# Patient Record
Sex: Male | Born: 1960 | Race: White | Hispanic: No | Marital: Single | Smoking: Former smoker
Health system: Southern US, Community
[De-identification: ages and names within clinical notes are randomized; demographics above are authoritative.]

## PROBLEM LIST (undated history)

## (undated) DIAGNOSIS — R569 Unspecified convulsions: Secondary | ICD-10-CM

---

## 2015-04-01 ENCOUNTER — Other Ambulatory Visit: Payer: Self-pay | Admitting: Otolaryngology

## 2015-04-01 DIAGNOSIS — R0982 Postnasal drip: Secondary | ICD-10-CM

## 2015-04-01 DIAGNOSIS — R51 Headache: Secondary | ICD-10-CM

## 2015-04-01 DIAGNOSIS — R519 Headache, unspecified: Secondary | ICD-10-CM

## 2015-04-07 ENCOUNTER — Ambulatory Visit
Admission: RE | Admit: 2015-04-07 | Discharge: 2015-04-07 | Disposition: A | Discharge status: Home / Self Care | Payer: No Typology Code available for payment source | Source: Ambulatory Visit | Attending: Otolaryngology | Admitting: Otolaryngology

## 2015-04-07 DIAGNOSIS — R51 Headache: Secondary | ICD-10-CM | POA: Insufficient documentation

## 2015-04-07 DIAGNOSIS — R0982 Postnasal drip: Secondary | ICD-10-CM

## 2015-04-07 DIAGNOSIS — R519 Headache, unspecified: Secondary | ICD-10-CM

## 2015-04-07 DIAGNOSIS — R0981 Nasal congestion: Secondary | ICD-10-CM | POA: Diagnosis present

## 2016-01-06 ENCOUNTER — Emergency Department
Admission: EM | Admit: 2016-01-06 | Discharge: 2016-01-06 | Disposition: A | Discharge status: Home / Self Care | Payer: No Typology Code available for payment source | Attending: Emergency Medicine | Admitting: Emergency Medicine

## 2016-01-06 ENCOUNTER — Emergency Department: Payer: No Typology Code available for payment source

## 2016-01-06 DIAGNOSIS — F19230 Other psychoactive substance dependence with withdrawal, uncomplicated: Secondary | ICD-10-CM

## 2016-01-06 DIAGNOSIS — F19930 Other psychoactive substance use, unspecified with withdrawal, uncomplicated: Secondary | ICD-10-CM

## 2016-01-06 DIAGNOSIS — Z87891 Personal history of nicotine dependence: Secondary | ICD-10-CM | POA: Insufficient documentation

## 2016-01-06 DIAGNOSIS — F1010 Alcohol abuse, uncomplicated: Secondary | ICD-10-CM | POA: Insufficient documentation

## 2016-01-06 DIAGNOSIS — R569 Unspecified convulsions: Secondary | ICD-10-CM | POA: Insufficient documentation

## 2016-01-06 LAB — COMPREHENSIVE METABOLIC PANEL
ALT: 58 U/L (ref 17–63)
AST: 81 U/L — AB (ref 15–41)
Albumin: 5 g/dL (ref 3.5–5.0)
Alkaline Phosphatase: 57 U/L (ref 38–126)
Anion gap: 23 — ABNORMAL HIGH (ref 5–15)
BILIRUBIN TOTAL: 2.3 mg/dL — AB (ref 0.3–1.2)
BUN: 7 mg/dL (ref 6–20)
CO2: 20 mmol/L — ABNORMAL LOW (ref 22–32)
CREATININE: 0.55 mg/dL — AB (ref 0.61–1.24)
Calcium: 9.6 mg/dL (ref 8.9–10.3)
Chloride: 92 mmol/L — ABNORMAL LOW (ref 101–111)
GFR calc Af Amer: >60 mL/min (ref >60)
Glucose, Bld: 210 mg/dL — ABNORMAL HIGH (ref 65–99)
POTASSIUM: 3.3 mmol/L — AB (ref 3.5–5.1)
Sodium: 135 mmol/L (ref 135–145)
TOTAL PROTEIN: 8.5 g/dL — AB (ref 6.5–8.1)

## 2016-01-06 LAB — URINALYSIS COMPLETE WITH MICROSCOPIC (ARMC ONLY)
BILIRUBIN URINE: NEGATIVE
Leukocytes, UA: NEGATIVE
Nitrite: NEGATIVE
Protein, ur: >500 mg/dL — AB
SQUAMOUS EPITHELIAL / LPF: NONE SEEN
Specific Gravity, Urine: 1.016 (ref 1.005–1.030)
pH: 6 (ref 5.0–8.0)

## 2016-01-06 LAB — TROPONIN I

## 2016-01-06 LAB — CBC WITH DIFFERENTIAL/PLATELET
BASOS ABS: 0.1 10*3/uL (ref 0–0.1)
Basophils Relative: 1 %
Eosinophils Absolute: 0 10*3/uL (ref 0–0.7)
Eosinophils Relative: 0 %
HEMATOCRIT: 38 % — AB (ref 40.0–52.0)
Hemoglobin: 13.1 g/dL (ref 13.0–18.0)
LYMPHS ABS: 0.4 10*3/uL — AB (ref 1.0–3.6)
LYMPHS PCT: 9 %
MCH: 35.1 pg — ABNORMAL HIGH (ref 26.0–34.0)
MCHC: 34.3 g/dL (ref 32.0–36.0)
MCV: 102.2 fL — AB (ref 80.0–100.0)
MONOS PCT: 10 %
Monocytes Absolute: 0.5 10*3/uL (ref 0.2–1.0)
NEUTROS ABS: 3.8 10*3/uL (ref 1.4–6.5)
Neutrophils Relative %: 80 %
Platelets: 118 10*3/uL — ABNORMAL LOW (ref 150–440)
RBC: 3.72 MIL/uL — ABNORMAL LOW (ref 4.40–5.90)
RDW: 15.7 % — AB (ref 11.5–14.5)
WBC: 4.8 10*3/uL (ref 3.8–10.6)

## 2016-01-06 LAB — URINE DRUG SCREEN, QUALITATIVE (ARMC ONLY)
Amphetamines, Ur Screen: NOT DETECTED
Barbiturates, Ur Screen: NOT DETECTED
Benzodiazepine, Ur Scrn: NOT DETECTED
COCAINE METABOLITE, UR ~~LOC~~: NOT DETECTED
Cannabinoid 50 Ng, Ur ~~LOC~~: NOT DETECTED
MDMA (ECSTASY) UR SCREEN: NOT DETECTED
METHADONE SCREEN, URINE: NOT DETECTED
Opiate, Ur Screen: NOT DETECTED
Phencyclidine (PCP) Ur S: NOT DETECTED
TRICYCLIC, UR SCREEN: NOT DETECTED

## 2016-01-06 LAB — ETHANOL: Alcohol, Ethyl (B): <5 mg/dL (ref <5)

## 2016-01-06 MED ORDER — CHLORDIAZEPOXIDE HCL 25 MG PO CAPS
50.0000 mg | ORAL_CAPSULE | Freq: Once | ORAL | Status: AC
  Administered 2016-01-06: 50 mg via ORAL
  Filled 2016-01-06: qty 2

## 2016-01-06 MED ORDER — LORAZEPAM 2 MG/ML IJ SOLN
1.0000 mg | Freq: Once | INTRAMUSCULAR | Status: AC
  Administered 2016-01-06: 1 mg via INTRAVENOUS
  Filled 2016-01-06: qty 1

## 2016-01-06 MED ORDER — CLONIDINE HCL 0.1 MG/24HR TD PTWK: 0.1000 mg | MEDICATED_PATCH | TRANSDERMAL | Status: DC

## 2016-01-06 MED ORDER — CHLORDIAZEPOXIDE HCL 25 MG PO CAPS: ORAL_CAPSULE | ORAL | Status: DC

## 2016-01-06 NOTE — ED Provider Notes (Signed)
[facility] Emergency Department Provider Note     Time seen: ----------------------------------------- 7:16 PM on 01/06/2016 -----------------------------------------    I have reviewed the triage vital signs and the nursing notes.   HISTORY  Chief Complaint Seizures    HPI Sergio Curry is a 55 y.o. male who presents to the ER after a seizure like event. Spouse stated he became unresponsive stiffened up and started shaking during supper. EMS arrived stated he was post ictal but he does not have a history of seizures. Patient reports he drinks alcohol but has not drank since last night. He was incontinent, did not bite his tongue.   History reviewed. No pertinent past medical history.  There are no active problems to display for this patient.   History reviewed. No pertinent past surgical history.  Allergies Review of patient's allergies indicates not on file.  Social History Social History  Substance Use Topics  . Smoking status: None  . Smokeless tobacco: None  . Alcohol Use: None    Review of Systems Constitutional: Negative for fever. Eyes: Negative for visual changes. ENT: Negative for sore throat. Cardiovascular: Negative for chest pain. Respiratory: Negative for shortness of breath. Gastrointestinal: Negative for abdominal pain, vomiting and diarrhea. Genitourinary: Negative for dysuria. Musculoskeletal: Negative for back pain. Skin: Negative for rash. Neurological: Negative for headaches, focal weakness or numbness.  10-point ROS otherwise negative.  ____________________________________________   PHYSICAL EXAM:  VITAL SIGNS: ED Triage Vitals  Enc Vitals Group     BP --      Pulse --      Resp --      Temp --      Temp src --      SpO2 01/06/16 1910 96 %     Weight --      Height --      Head Cir --      Peak Flow --      Pain Score 01/06/16 1914 0     Pain Loc --      Pain Edu? --      Excl. in GC? --      Constitutional: Alert but mildly disoriented to the date. Eyes: Conjunctivae are normal. PERRL. Normal extraocular movements. ENT   Head: Normocephalic and atraumatic.   Nose: No congestion/rhinnorhea.   Mouth/Throat: Mucous membranes are moist.   Neck: No stridor. Cardiovascular: Rapid rate, regular rhythm. No murmurs, rubs, or gallops. Respiratory: Normal respiratory effort without tachypnea nor retractions. Breath sounds are clear and equal bilaterally. No wheezes/rales/rhonchi. Gastrointestinal: Soft and nontender. Normal bowel sounds Musculoskeletal: Nontender with normal range of motion in all extremities. No lower extremity tenderness nor edema. Neurologic:  Normal speech and language. No gross focal neurologic deficits are appreciated. Tremulous, no sensory or motor deficits Skin:  Skin is warm, dry and intact. No rash noted. Psychiatric: Mood and affect are normal. Speech and behavior are normal.  ____________________________________________  EKG: Interpreted by me. Sinus tachycardia with a rate of 113 bpm, normal PR interval, normal QRS, normal QT interval. LVH, normal axis  ____________________________________________  ED COURSE:  Pertinent labs & imaging results that were available during my care of the patient were reviewed by me and considered in my medical decision making (see chart for details). Patient likely with alcohol withdrawal seizure. He will receive Ativan and be placed on CIWA protocol. We'll also assess for medical etiology for his seizure. ____________________________________________    LABS (pertinent positives/negatives)  Labs Reviewed  CBC WITH DIFFERENTIAL/PLATELET - Abnormal;  Notable for the following:    RBC 3.72 (*)    HCT 38.0 (*)    MCV 102.2 (*)    MCH 35.1 (*)    RDW 15.7 (*)    Platelets 118 (*)    Lymphs Abs 0.4 (*)    All other components within normal limits  COMPREHENSIVE METABOLIC PANEL - Abnormal; Notable for the  following:    Potassium 3.3 (*)    Chloride 92 (*)    CO2 20 (*)    Glucose, Bld 210 (*)    Creatinine, Ser 0.55 (*)    Total Protein 8.5 (*)    AST 81 (*)    Total Bilirubin 2.3 (*)    Anion gap 23 (*)    All other components within normal limits  TROPONIN I  ETHANOL  URINALYSIS COMPLETEWITH MICROSCOPIC ([facility] ONLY)  URINE DRUG SCREEN, QUALITATIVE ([facility] ONLY)    RADIOLOGY Images were viewed by me  CT head IMPRESSION: Mild generalized atrophy. No acute intracranial abnormality. ____________________________________________  FINAL ASSESSMENT AND PLAN  Seizure, chronic alcoholism  Plan: Patient with labs and imaging as dictated above. Patient presents after what is likely a withdrawal seizure. Currently his blood pressures improving but he remains tachycardic. I have offered him admission for detox and he has declined at this time. Patient states his mother will be buried tomorrow and he cannot stay for detox and to prevent withdrawal seizures. I advised him of the risks of attempting this at home. He was given an oral dose of Librium here and he will be discharged with a Librium taper and clonidine. I have given him information for RTS as well.   Emily FilbertWilliams, Dejae Bernet E, MD   Emily FilbertJonathan E Kasai Beltran, MD 01/06/16 [phone]

## 2016-01-06 NOTE — Discharge Instructions (Signed)
Alcohol Use Disorder  Alcohol use disorder is a mental disorder. It is not a one-time incident of heavy drinking. Alcohol use disorder is the excessive and uncontrollable use of alcohol over time that leads to problems with functioning in one or more areas of daily living. People with this disorder risk harming themselves and others when they drink to excess. Alcohol use disorder also can cause other mental disorders, such as mood and anxiety disorders, and serious physical problems. People with alcohol use disorder often misuse other drugs.   Alcohol use disorder is common and widespread. Some people with this disorder drink alcohol to cope with or escape from negative life events. Others drink to relieve chronic pain or symptoms of mental illness. People with a family history of alcohol use disorder are at higher risk of losing control and using alcohol to excess.   Drinking too much alcohol can cause injury, accidents, and health problems. One drink can be too much when you are:  · Working.  · Pregnant or breastfeeding.  · Taking medicines. Ask your doctor.  · Driving or planning to drive.  SYMPTOMS   Signs and symptoms of alcohol use disorder may include the following:   · Consumption of alcohol in larger amounts or over a longer period of time than intended.  · Multiple unsuccessful attempts to cut down or control alcohol use.    · A great deal of time spent obtaining alcohol, using alcohol, or recovering from the effects of alcohol (hangover).  · A strong desire or urge to use alcohol (cravings).    · Continued use of alcohol despite problems at work, school, or home because of alcohol use.    · Continued use of alcohol despite problems in relationships because of alcohol use.  · Continued use of alcohol in situations when it is physically hazardous, such as driving a car.  · Continued use of alcohol despite awareness of a physical or psychological problem that is likely related to alcohol use. Physical  problems related to alcohol use can involve the brain, heart, liver, stomach, and intestines. Psychological problems related to alcohol use include intoxication, depression, anxiety, psychosis, delirium, and dementia.    · The need for increased amounts of alcohol to achieve the same desired effect, or a decreased effect from the consumption of the same amount of alcohol (tolerance).  · Withdrawal symptoms upon reducing or stopping alcohol use, or alcohol use to reduce or avoid withdrawal symptoms. Withdrawal symptoms include:  ¨ Racing heart.  ¨ Hand tremor.  ¨ Difficulty sleeping.  ¨ Nausea.  ¨ Vomiting.  ¨ Hallucinations.  ¨ Restlessness.  ¨ Seizures.  DIAGNOSIS  Alcohol use disorder is diagnosed through an assessment by your health care provider. Your health care provider may start by asking three or four questions to screen for excessive or problematic alcohol use. To confirm a diagnosis of alcohol use disorder, at least two symptoms must be present within a 12-month period. The severity of alcohol use disorder depends on the number of symptoms:  · Mild--two or three.  · Moderate--four or five.  · Severe--six or more.  Your health care provider may perform a physical exam or use results from lab tests to see if you have physical problems resulting from alcohol use. Your health care provider may refer you to a mental health professional for evaluation.  TREATMENT   Some people with alcohol use disorder are able to reduce their alcohol use to low-risk levels. Some people with alcohol use disorder need to quit drinking alcohol. When   necessary, mental health professionals with specialized training in substance use treatment can help. Your health care provider can help you decide how severe your alcohol use disorder is and what type of treatment you need. The following forms of treatment are available:   · Detoxification. Detoxification involves the use of prescription medicines to prevent alcohol withdrawal  symptoms in the first week after quitting. This is important for people with a history of symptoms of withdrawal and for heavy drinkers who are likely to have withdrawal symptoms. Alcohol withdrawal can be dangerous and, in severe cases, cause death. Detoxification is usually provided in a hospital or in-patient substance use treatment facility.  · Counseling or talk therapy. Talk therapy is provided by substance use treatment counselors. It addresses the reasons people use alcohol and ways to keep them from drinking again. The goals of talk therapy are to help people with alcohol use disorder find healthy activities and ways to cope with life stress, to identify and avoid triggers for alcohol use, and to handle cravings, which can cause relapse.  · Medicines. Different medicines can help treat alcohol use disorder through the following actions:    Decrease alcohol cravings.    Decrease the positive reward response felt from alcohol use.    Produce an uncomfortable physical reaction when alcohol is used (aversion therapy).  · Support groups. Support groups are run by people who have quit drinking. They provide emotional support, advice, and guidance.  These forms of treatment are often combined. Some people with alcohol use disorder benefit from intensive combination treatment provided by specialized substance use treatment centers. Both inpatient and outpatient treatment programs are available.     This information is not intended to replace advice given to you by your health care provider. Make sure you discuss any questions you have with your health care provider.     Document Released: 10/07/2004 Document Revised: 09/20/2014 Document Reviewed: 12/07/2012  Elsevier Interactive Patient Education ©2016 [employer]    Alcohol Withdrawal  Alcohol withdrawal is a group of symptoms that can develop when a person who drinks heavily and regularly stops drinking or drinks less.  CAUSES  Heavy and regular drinking can  cause chemicals that send signals from the brain to the body (neurotransmitters) to deactivate. Alcohol withdrawal develops when deactivated neurotransmitters reactivate because a person stops drinking or drinks less.  RISK FACTORS  The more a person drinks and the longer he or she drinks, the greater the risk of alcohol withdrawal. Severe withdrawal is more likely to develop in someone who:  · Had severe alcohol withdrawal in the past.  · Had a seizure during a previous episode of alcohol withdrawal.  · Is elderly.  · Is pregnant.  · Has been abusing drugs.  · Has other medical problems, including:    Infection.    Heart, lung, or liver disease.    Seizures.    Mental health problems.  SYMPTOMS  Symptoms of this condition can be mild to moderate, or they can be severe.  Mild to moderate symptoms may include:  · Fatigue.  · Nightmares.  · Trouble sleeping.  · Depression.  · Anxiety.  · Inability to think clearly.  · Mood swings.  · Irritability.  · Loss of appetite.  · Nausea or vomiting.  · Clammy skin.  · Extreme sweating.  · Rapid heartbeat.  · Shakiness.  · Uncontrollable shaking (tremor).  Severe symptoms may include:  · Fever.  · Seizures.  · Severe confusion.  · Feeling   or seeing things that are not there (hallucinations).  Symptoms usually begin within eight hours after a person stops drinking or drinks less. They can last for weeks.  DIAGNOSIS  Alcohol withdrawal is diagnosed with a medical history and physical exam. Sometimes, urine and blood tests are also done.  TREATMENT  Treatment may involve:  · Monitoring blood pressure, pulse, and breathing.  · Getting fluids through an IV tube.  · Medicine to reduce anxiety.  · Medicine to prevent or control seizures.  · Multivitamins and B vitamins.  · Having a health care provider check on you daily.  If symptoms are moderate to severe or if there is a risk of severe withdrawal, treatment may be done at a hospital or treatment center.  HOME CARE  INSTRUCTIONS  · Take medicines and vitamin supplements only as directed by your health care provider.  · Do not drink alcohol.  · Have someone stay with you or be available if you need help.  · Drink enough fluid to keep your urine clear or pale yellow.  · Consider joining a 12-step program or another alcohol support group.  SEEK MEDICAL CARE IF:  · Your symptoms get worse or do not go away.  · You cannot keep food or water in your stomach.  · You are struggling with not drinking alcohol.  · You cannot stop drinking alcohol.  SEEK IMMEDIATE MEDICAL CARE IF:   · You have an irregular heartbeat.  · You have chest pain.  · You have trouble breathing.  · You have symptoms of severe withdrawal, such as:    A fever.    Seizures.    Severe confusion.    Hallucinations.     This information is not intended to replace advice given to you by your health care provider. Make sure you discuss any questions you have with your health care provider.     Document Released: 06/09/2005 Document Revised: 09/20/2014 Document Reviewed: 06/18/2014  Elsevier Interactive Patient Education ©2016 Else[employer] Inc.

## 2016-01-06 NOTE — ED Notes (Signed)
Per ems the spouse stated that pt "fell out" and stiffened up and started having a seizure during supper - ems arrived and stated he was foaming at the mouth and was post-dictal - ems reports htn and sinus tach - pt presents with memory loss and does not remember the seizure activity - oriented to person only at this time - pt has voided on self and was not aware - MD at bedside assessing pt

## 2016-01-06 NOTE — ED Notes (Signed)
Per ems the spouse stated that pt "fell out" and stiffened up and started having a seizure during supper - ems arrived and stated he was foaming at the mouth and was post-dictal - ems reports htn and sinus tach

## 2016-01-06 NOTE — ED Notes (Signed)
Pt aware of need for urine sample.  

## 2016-01-06 NOTE — ED Notes (Signed)
Pt reminded of need for urine sample.  

## 2016-05-27 IMAGING — CT CT MAXILLOFACIAL W/O CM
3 series · 16 of 47 positions shown, 19 images · non-contrast
Comparison: None.

CLINICAL DATA: Left facial congestion, pain and pressure. Left
exophthalmos. Blurred vision. Facial trauma 7220 with rhinoplasty

EXAM:
CT MAXILLOFACIAL WITHOUT CONTRAST
TECHNIQUE: Multidetector CT imaging of the maxillofacial structures was
performed. Multiplanar CT image reconstructions were also generated.
A small metallic BB was placed on the right temple in order to
reliably differentiate right from left.

[Series 2: max soft · axial · 0.29mm/px · z∈[-198,-70]mm · 10 of 76 slices shown, 13 images]
[im 6/76  brain]
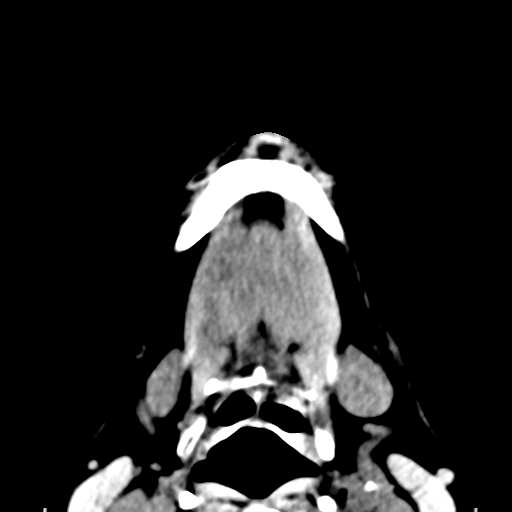
[im 6/76  bone]
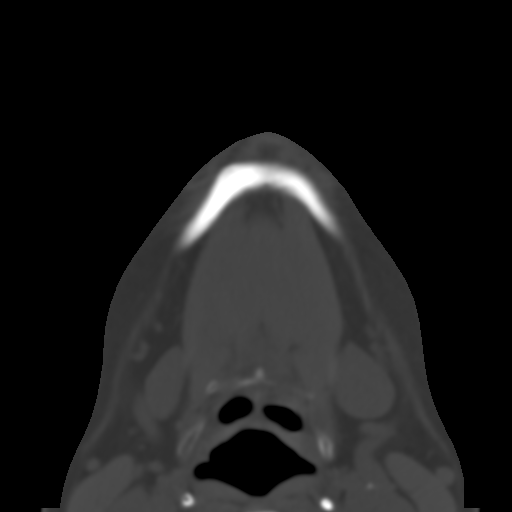
[im 13/76  bone]
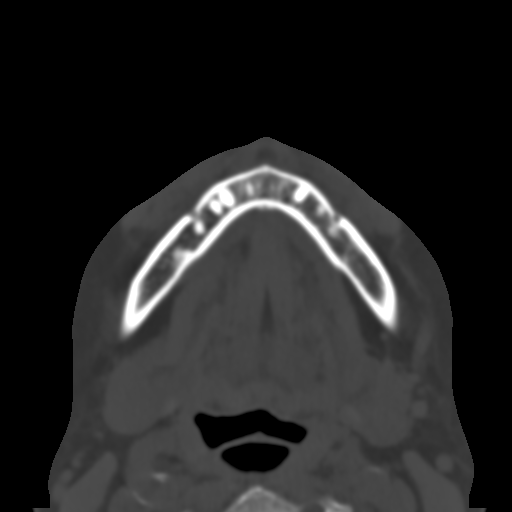
[im 21/76  bone]
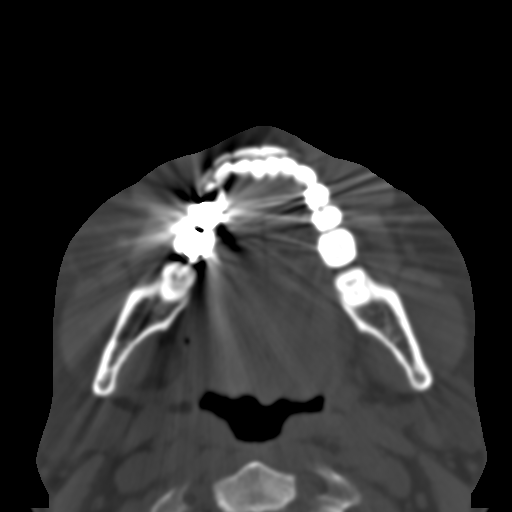
[im 26/76  bone]
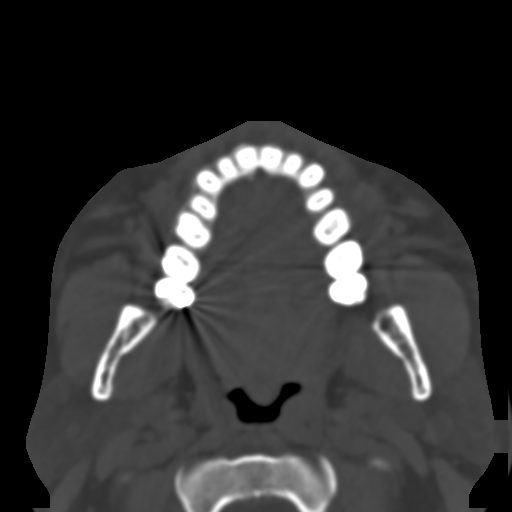
[im 34/76  brain]
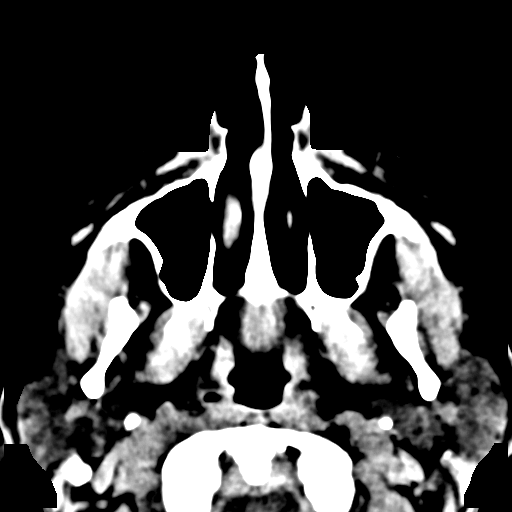
[im 34/76  bone]
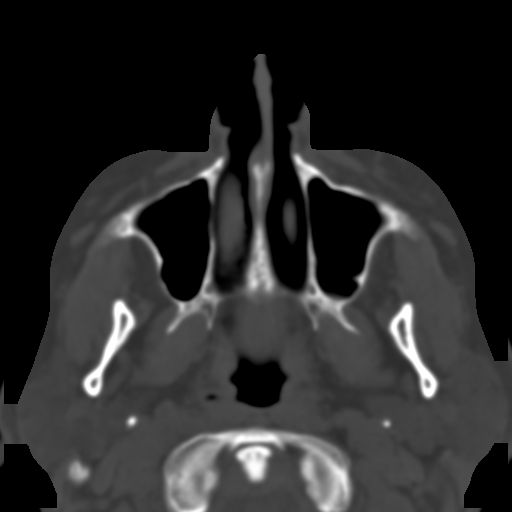
[im 42/76  bone]
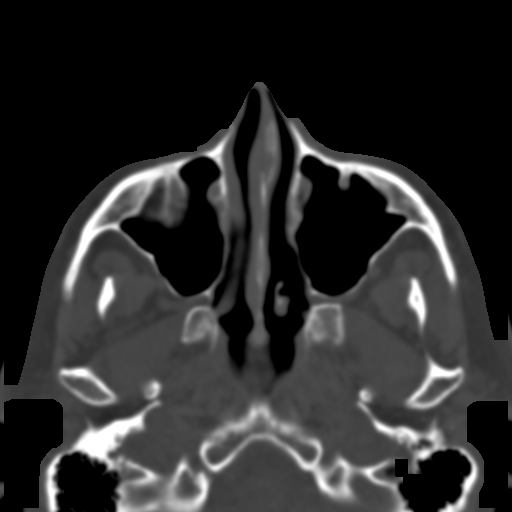
[im 50/76  bone]
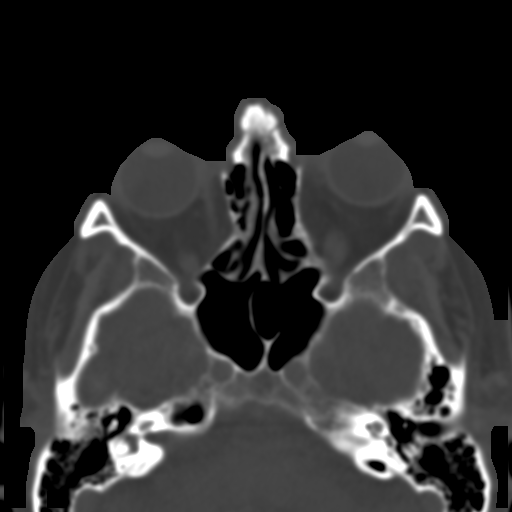
[im 57/76  bone]
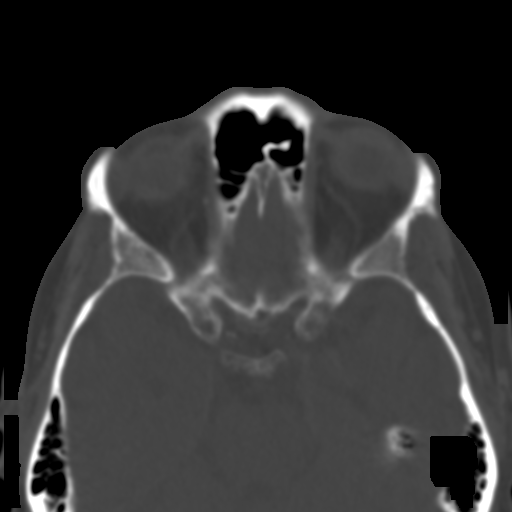
[im 63/76  brain]
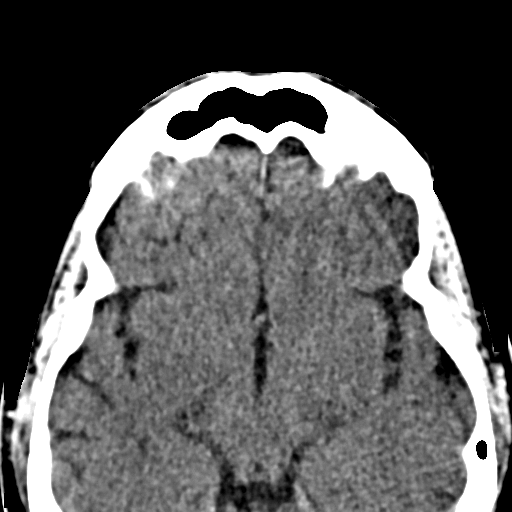
[im 63/76  bone]
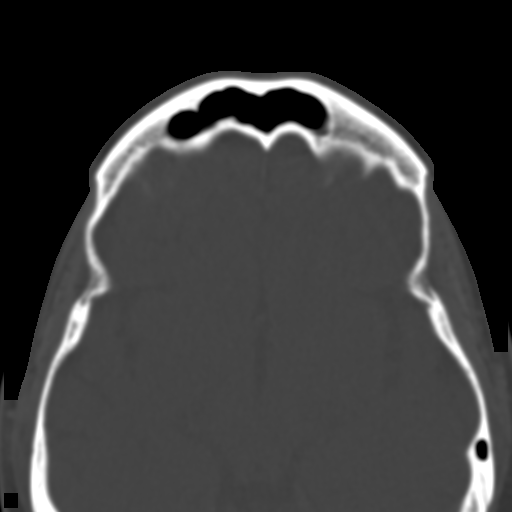
[im 70/76  bone]
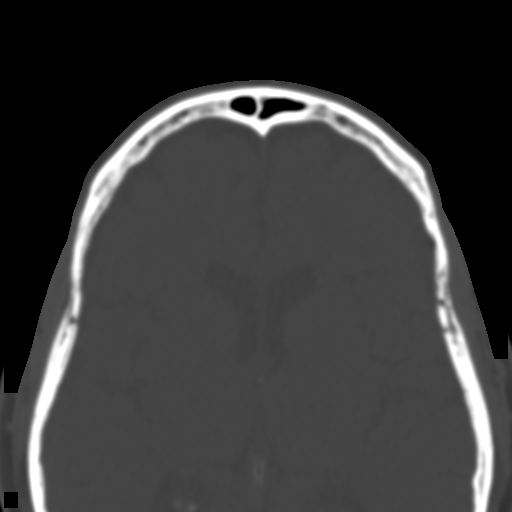

[Series 4: coronal soft · coronal · 0.31mm/px · 3 of 71 slices shown]
[im 24/71  bone]
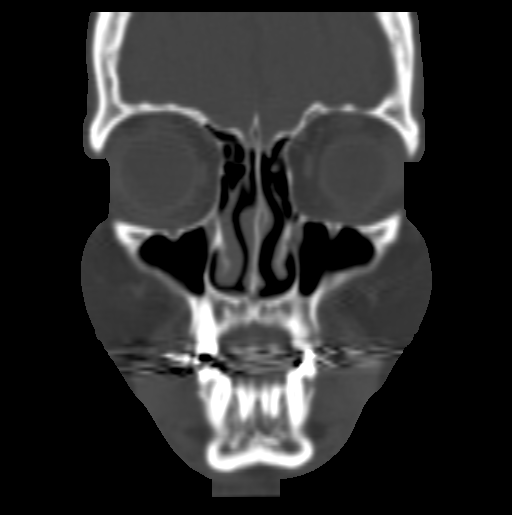
[im 32/71  bone]
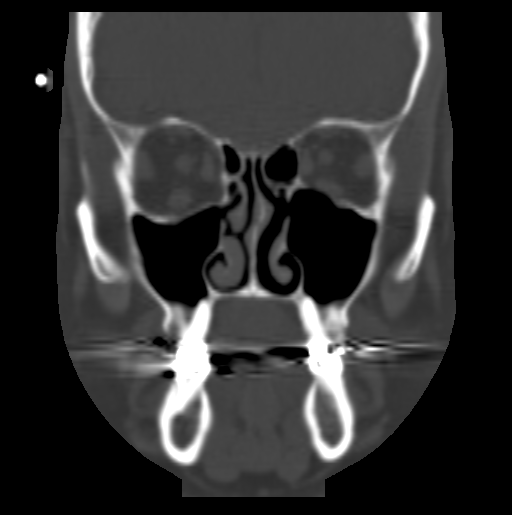
[im 39/71  bone]
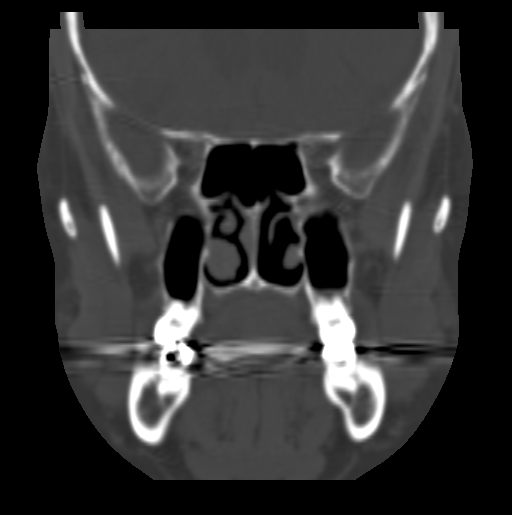

[Series 5: sagittal soft · sagittal · 0.29mm/px · 3 of 81 slices shown]
[im 27/81  bone]
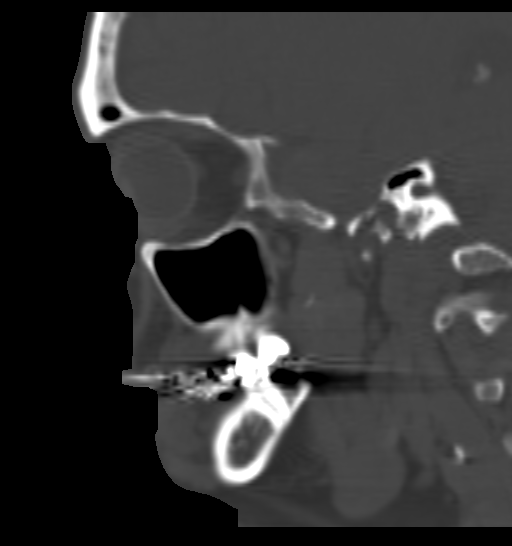
[im 41/81  bone]
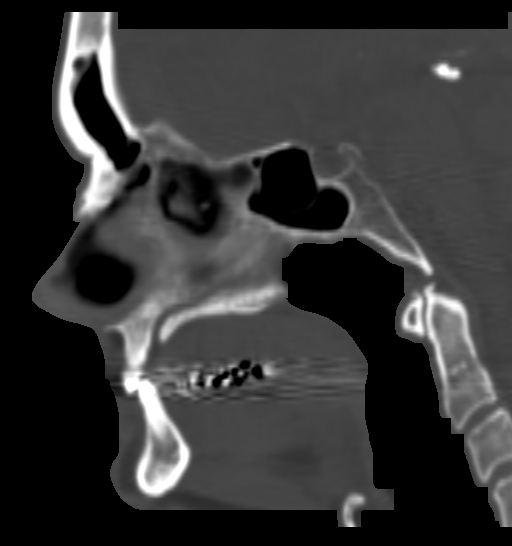
[im 54/81  bone]
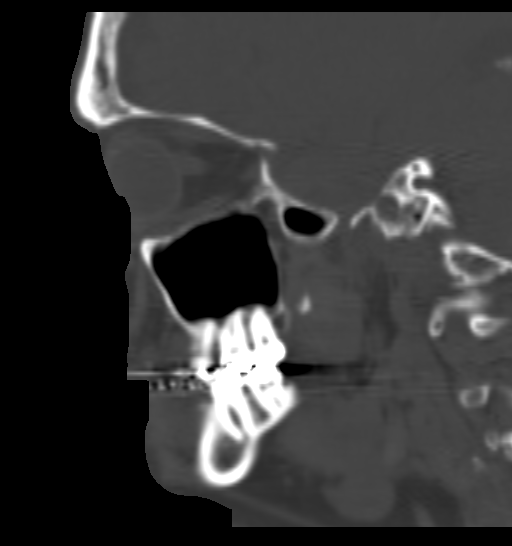

[16 of 47 positions shown; findings below may reference images not displayed]

FINDINGS: Frontal, ethmoid, sphenoid and maxillary sinuses are entirely clear
without mucosal thickening, polyp, cyst, mass or layering fluid.

There are old medial wall orbital fractures bilaterally which have
healed with some inward bowing, more so on the right than the left.
This could explain the relative ex on the most on the left. There
are old nasal fractures, treated surgically by history. The nasal
septum bows to the left 3 mm superiorly. Ostiomeatal complexes are
widely patent bilaterally, with previous surgical change on the left
of partial ethmoidectomy and middle turbinate resection. Nasal
passages appear patent.

No evidence of injury to either globe. Extra-ocular muscles appear
normal. Intraorbital soft tissues otherwise appear normal.

No intracranial abnormality other than atherosclerosis of the major
vessels at the base of the brain.
IMPRESSION: No evidence of inflammatory sinus disease.

Old lamina papyracea fractures bilaterally which have healed with
some inward bowing, more on the right than the left. This could
explain the left exophthalmos relative to the right.

Old nasal fractures treated surgically.

Wide patency of the ostiomeatal complexes bilaterally. Previous
partial ethmoidectomy and middle turbinate resection on the left.

## 2019-06-19 ENCOUNTER — Emergency Department
Admission: EM | Admit: 2019-06-19 | Discharge: 2019-06-19 | Disposition: A | Discharge status: Home / Self Care | Payer: No Typology Code available for payment source | Attending: Emergency Medicine | Admitting: Emergency Medicine

## 2019-06-19 ENCOUNTER — Other Ambulatory Visit: Payer: Self-pay

## 2019-06-19 DIAGNOSIS — Z79899 Other long term (current) drug therapy: Secondary | ICD-10-CM | POA: Diagnosis not present

## 2019-06-19 DIAGNOSIS — G40909 Epilepsy, unspecified, not intractable, without status epilepticus: Secondary | ICD-10-CM | POA: Insufficient documentation

## 2019-06-19 DIAGNOSIS — R569 Unspecified convulsions: Secondary | ICD-10-CM | POA: Diagnosis present

## 2019-06-19 DIAGNOSIS — Z87891 Personal history of nicotine dependence: Secondary | ICD-10-CM | POA: Insufficient documentation

## 2019-06-19 HISTORY — DX: Unspecified convulsions: R56.9

## 2019-06-19 LAB — COMPREHENSIVE METABOLIC PANEL
ALT: 29 U/L (ref 0–44)
AST: 71 U/L — ABNORMAL HIGH (ref 15–41)
Albumin: 4.7 g/dL (ref 3.5–5.0)
Alkaline Phosphatase: 67 U/L (ref 38–126)
Anion gap: 22 — ABNORMAL HIGH (ref 5–15)
BUN: 6 mg/dL (ref 6–20)
CO2: 21 mmol/L — ABNORMAL LOW (ref 22–32)
Calcium: 9.4 mg/dL (ref 8.9–10.3)
Chloride: 90 mmol/L — ABNORMAL LOW (ref 98–111)
Creatinine, Ser: 0.45 mg/dL — ABNORMAL LOW (ref 0.61–1.24)
GFR calc Af Amer: >60 mL/min (ref >60)
GFR calc non Af Amer: >60 mL/min (ref >60)
Glucose, Bld: 188 mg/dL — ABNORMAL HIGH (ref 70–99)
Potassium: 3.4 mmol/L — ABNORMAL LOW (ref 3.5–5.1)
Sodium: 133 mmol/L — ABNORMAL LOW (ref 135–145)
Total Bilirubin: 2.3 mg/dL — ABNORMAL HIGH (ref 0.3–1.2)
Total Protein: 8.1 g/dL (ref 6.5–8.1)

## 2019-06-19 LAB — BASIC METABOLIC PANEL
Anion gap: 15 (ref 5–15)
BUN: 6 mg/dL (ref 6–20)
CO2: 26 mmol/L (ref 22–32)
Calcium: 8.4 mg/dL — ABNORMAL LOW (ref 8.9–10.3)
Chloride: 93 mmol/L — ABNORMAL LOW (ref 98–111)
Creatinine, Ser: 0.39 mg/dL — ABNORMAL LOW (ref 0.61–1.24)
GFR calc Af Amer: >60 mL/min (ref >60)
GFR calc non Af Amer: >60 mL/min (ref >60)
Glucose, Bld: 110 mg/dL — ABNORMAL HIGH (ref 70–99)
Potassium: 3.1 mmol/L — ABNORMAL LOW (ref 3.5–5.1)
Sodium: 134 mmol/L — ABNORMAL LOW (ref 135–145)

## 2019-06-19 LAB — CBC
HCT: 34.3 % — ABNORMAL LOW (ref 39.0–52.0)
Hemoglobin: 12 g/dL — ABNORMAL LOW (ref 13.0–17.0)
MCH: 35.3 pg — ABNORMAL HIGH (ref 26.0–34.0)
MCHC: 35 g/dL (ref 30.0–36.0)
MCV: 100.9 fL — ABNORMAL HIGH (ref 80.0–100.0)
Platelets: 90 10*3/uL — ABNORMAL LOW (ref 150–400)
RBC: 3.4 MIL/uL — ABNORMAL LOW (ref 4.22–5.81)
RDW: 14.6 % (ref 11.5–15.5)
WBC: 4.6 10*3/uL (ref 4.0–10.5)
nRBC: 0 % (ref 0.0–0.2)

## 2019-06-19 LAB — TROPONIN I (HIGH SENSITIVITY): Troponin I (High Sensitivity): 6 ng/L (ref <18)

## 2019-06-19 MED ORDER — CHLORDIAZEPOXIDE HCL 25 MG PO CAPS
25.0000 mg | ORAL_CAPSULE | Freq: Once | ORAL | Status: AC
  Administered 2019-06-19: 25 mg via ORAL
  Filled 2019-06-19: qty 1

## 2019-06-19 MED ORDER — LORAZEPAM 1 MG PO TABS
1.0000 mg | ORAL_TABLET | Freq: Once | ORAL | Status: AC
  Administered 2019-06-19: 1 mg via ORAL
  Filled 2019-06-19: qty 1

## 2019-06-19 MED ORDER — SODIUM CHLORIDE 0.9 % IV BOLUS
1000.0000 mL | Freq: Once | INTRAVENOUS | Status: AC
  Administered 2019-06-19: 1000 mL via INTRAVENOUS

## 2019-06-19 NOTE — ED Notes (Signed)
Pt given urinal.
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
## 2020-05-21 ENCOUNTER — Other Ambulatory Visit: Payer: Self-pay

## 2020-05-21 ENCOUNTER — Ambulatory Visit
Admission: EM | Admit: 2020-05-21 | Discharge: 2020-05-21 | Disposition: A | Discharge status: Home / Self Care | Payer: No Typology Code available for payment source | Attending: Family Medicine | Admitting: Family Medicine

## 2020-05-21 DIAGNOSIS — H938X1 Other specified disorders of right ear: Secondary | ICD-10-CM | POA: Diagnosis not present

## 2020-05-21 DIAGNOSIS — H6091 Unspecified otitis externa, right ear: Secondary | ICD-10-CM

## 2020-05-21 MED ORDER — CIPROFLOXACIN-DEXAMETHASONE 0.3-0.1 % OT SUSP: 4.0000 [drp] | Freq: Two times a day (BID) | OTIC | 0 refills | Status: DC

## 2020-05-21 NOTE — ED Triage Notes (Signed)
Pt is here with right ear pain that started Saturday, pt has taken Advil to relieve discomfort.

## 2020-05-21 NOTE — Discharge Instructions (Signed)
Use 4 drops twice a day for 7 days  Follow up with this office or with primary care if symptoms are persisting  Follow up with the ER for trouble swallowing, trouble breathing, other concerning symptoms

## 2020-05-21 NOTE — ED Provider Notes (Signed)
[facility]   [id number] 05/21/20 Arrival Time: 1558  CC: EAR PAIN  SUBJECTIVE: History from: patient.  Sergio Curry is a 59 y.o. male who presents with of right ear fullness that started about 4 days ago.  Reports that he does use ear buds.  Reports that the right ear feels wet and he has decreased hearing since he began having symptoms 4 days ago.  Has attempted OTC treatment with peroxide and water. There has been no relief. Denies fever, chills, fatigue, sinus pain, rhinorrhea, sore throat, SOB, wheezing, chest pain, nausea, changes in bowel or bladder habits.    ROS: As per HPI.  All other pertinent ROS negative.     Past Medical History:  Diagnosis Date  . Seizures (HCC)    History reviewed. No pertinent surgical history. No Known Allergies No current facility-administered medications on file prior to encounter.   Current Outpatient Medications on File Prior to Encounter  Medication Sig Dispense Refill  . chlordiazePOXIDE (LIBRIUM) 25 MG capsule Take 1 capsule 5 times a day on day 1, then decrease by one capsule daily until gone 15 capsule 0  . cloNIDine (CATAPRES - DOSED IN MG/24 HR) 0.1 mg/24hr patch Place 1 patch (0.1 mg total) onto the skin every 7 (seven) days. 4 patch 11   Social History   Socioeconomic History  . Marital status: Single    Spouse name: Not on file  . Number of children: Not on file  . Years of education: Not on file  . Highest education level: Not on file  Occupational History  . Not on file  Tobacco Use  . Smoking status: Former Smoker    Types: Cigarettes  . Smokeless tobacco: Never Used  Substance and Sexual Activity  . Alcohol use: Yes    Alcohol/week: 7.0 standard drinks    Types: 4 Cans of beer, 3 Shots of liquor per week  . Drug use: Yes    Types: Marijuana  . Sexual activity: Not Currently    Birth control/protection: None  Other Topics Concern  . Not on file  Social History Narrative  . Not on file   Social  Determinants of Health   Financial Resource Strain:   . Difficulty of Paying Living Expenses: Not on file  Food Insecurity:   . Worried About Programme researcher, broadcasting/film/video in the Last Year: Not on file  . Ran Out of Food in the Last Year: Not on file  Transportation Needs:   . Lack of Transportation (Medical): Not on file  . Lack of Transportation (Non-Medical): Not on file  Physical Activity:   . Days of Exercise per Week: Not on file  . Minutes of Exercise per Session: Not on file  Stress:   . Feeling of Stress : Not on file  Social Connections:   . Frequency of Communication with Friends and Family: Not on file  . Frequency of Social Gatherings with Friends and Family: Not on file  . Attends Religious Services: Not on file  . Active Member of Clubs or Organizations: Not on file  . Attends Banker Meetings: Not on file  . Marital Status: Not on file  Intimate Partner Violence:   . Fear of Current or Ex-Partner: Not on file  . Emotionally Abused: Not on file  . Physically Abused: Not on file  . Sexually Abused: Not on file   History reviewed. No pertinent family history.  OBJECTIVE:  Vitals:   05/21/20 1617  BP: (!) 174/90  Pulse: 78  Resp: 17  TempSrc: Oral  SpO2: 98%     General appearance: alert; appears fatigued HEENT: Ears: L EAC clear, R EAC with erythema, swelling, drainage, tenderness, TMs pearly gray with visible cone of light, without erythema ; Eyes: PERRL, EOMI grossly; Sinuses nontender to palpation; Nose: clear rhinorrhea; Throat: oropharynx mildly erythematous, tonsils 1+ without white tonsillar exudates, uvula midline Neck: supple without LAD Lungs: unlabored respirations, symmetrical air entry; cough: absent; no respiratory distress Heart: regular rate and rhythm.  Radial pulses 2+ symmetrical bilaterally Skin: warm and dry Psychological: alert and cooperative; normal mood and affect  Imaging: No results found.   ASSESSMENT & PLAN:  1.  Otitis externa of right ear, unspecified chronicity, unspecified type   2. Ear fullness, right     Meds ordered this encounter  Medications  . ciprofloxacin-dexamethasone (CIPRODEX) OTIC suspension    Sig: Place 4 drops into the right ear 2 (two) times daily.    Dispense:  7.5 mL    Refill:  0    Order Specific Question:   Supervising Provider    Answer:   Merrilee Jansky [[id number]]    Rest and drink plenty of fluids Prescribed ciprodex drops Take medications as directed and to completion Continue to use OTC ibuprofen and/ or tylenol as needed for pain control Follow up with PCP if symptoms persists Return here or go to the ER if you have any new or worsening symptoms   Reviewed expectations re: course of current medical issues. Questions answered. Outlined signs and symptoms indicating need for more acute intervention. Patient verbalized understanding. After Visit Summary given.         Moshe Cipro, NP 05/21/20 [phone]

## 2020-05-22 ENCOUNTER — Telehealth (HOSPITAL_COMMUNITY): Payer: Self-pay | Admitting: Emergency Medicine

## 2020-05-22 MED ORDER — CIPROFLOXACIN-DEXAMETHASONE 0.3-0.1 % OT SUSP: 4.0000 [drp] | Freq: Two times a day (BID) | OTIC | 0 refills | Status: DC

## 2020-05-22 NOTE — Telephone Encounter (Signed)
Patient wanted pharmacy changed

## 2024-07-07 ENCOUNTER — Emergency Department: Payer: Self-pay

## 2024-07-07 ENCOUNTER — Inpatient Hospital Stay
Admission: EM | Admit: 2024-07-07 | Discharge: 2024-07-15 | DRG: 870 | Disposition: A | Discharge status: Hospice | Payer: MEDICAID | Attending: Internal Medicine | Admitting: Internal Medicine

## 2024-07-07 ENCOUNTER — Inpatient Hospital Stay: Payer: Self-pay

## 2024-07-07 DIAGNOSIS — G312 Degeneration of nervous system due to alcohol: Secondary | ICD-10-CM | POA: Diagnosis present

## 2024-07-07 DIAGNOSIS — Z6820 Body mass index (BMI) 20.0-20.9, adult: Secondary | ICD-10-CM

## 2024-07-07 DIAGNOSIS — J9601 Acute respiratory failure with hypoxia: Secondary | ICD-10-CM

## 2024-07-07 DIAGNOSIS — Z1152 Encounter for screening for COVID-19: Secondary | ICD-10-CM

## 2024-07-07 DIAGNOSIS — K7010 Alcoholic hepatitis without ascites: Secondary | ICD-10-CM | POA: Diagnosis present

## 2024-07-07 DIAGNOSIS — Y900 Blood alcohol level of less than 20 mg/100 ml: Secondary | ICD-10-CM | POA: Diagnosis present

## 2024-07-07 DIAGNOSIS — E871 Hypo-osmolality and hyponatremia: Secondary | ICD-10-CM

## 2024-07-07 DIAGNOSIS — I700 Atherosclerosis of aorta: Secondary | ICD-10-CM | POA: Diagnosis present

## 2024-07-07 DIAGNOSIS — I351 Nonrheumatic aortic (valve) insufficiency: Secondary | ICD-10-CM | POA: Diagnosis present

## 2024-07-07 DIAGNOSIS — G40509 Epileptic seizures related to external causes, not intractable, without status epilepticus: Secondary | ICD-10-CM | POA: Diagnosis present

## 2024-07-07 DIAGNOSIS — Z515 Encounter for palliative care: Secondary | ICD-10-CM

## 2024-07-07 DIAGNOSIS — A411 Sepsis due to other specified staphylococcus: Principal | ICD-10-CM | POA: Diagnosis present

## 2024-07-07 DIAGNOSIS — E7220 Disorder of urea cycle metabolism, unspecified: Secondary | ICD-10-CM

## 2024-07-07 DIAGNOSIS — K7030 Alcoholic cirrhosis of liver without ascites: Secondary | ICD-10-CM | POA: Diagnosis present

## 2024-07-07 DIAGNOSIS — D6959 Other secondary thrombocytopenia: Secondary | ICD-10-CM | POA: Diagnosis present

## 2024-07-07 DIAGNOSIS — D649 Anemia, unspecified: Secondary | ICD-10-CM

## 2024-07-07 DIAGNOSIS — K709 Alcoholic liver disease, unspecified: Secondary | ICD-10-CM

## 2024-07-07 DIAGNOSIS — J690 Pneumonitis due to inhalation of food and vomit: Secondary | ICD-10-CM | POA: Diagnosis present

## 2024-07-07 DIAGNOSIS — R7989 Other specified abnormal findings of blood chemistry: Secondary | ICD-10-CM

## 2024-07-07 DIAGNOSIS — F1010 Alcohol abuse, uncomplicated: Secondary | ICD-10-CM

## 2024-07-07 DIAGNOSIS — Z66 Do not resuscitate: Secondary | ICD-10-CM | POA: Diagnosis not present

## 2024-07-07 DIAGNOSIS — E440 Moderate protein-calorie malnutrition: Secondary | ICD-10-CM | POA: Diagnosis present

## 2024-07-07 DIAGNOSIS — G9341 Metabolic encephalopathy: Principal | ICD-10-CM | POA: Diagnosis present

## 2024-07-07 DIAGNOSIS — D696 Thrombocytopenia, unspecified: Secondary | ICD-10-CM

## 2024-07-07 DIAGNOSIS — G928 Other toxic encephalopathy: Secondary | ICD-10-CM | POA: Diagnosis present

## 2024-07-07 DIAGNOSIS — I2489 Other forms of acute ischemic heart disease: Secondary | ICD-10-CM | POA: Diagnosis present

## 2024-07-07 DIAGNOSIS — Z681 Body mass index (BMI) 19 or less, adult: Secondary | ICD-10-CM

## 2024-07-07 DIAGNOSIS — I509 Heart failure, unspecified: Secondary | ICD-10-CM | POA: Diagnosis present

## 2024-07-07 DIAGNOSIS — I330 Acute and subacute infective endocarditis: Secondary | ICD-10-CM | POA: Diagnosis present

## 2024-07-07 DIAGNOSIS — D539 Nutritional anemia, unspecified: Secondary | ICD-10-CM | POA: Diagnosis present

## 2024-07-07 DIAGNOSIS — Z87891 Personal history of nicotine dependence: Secondary | ICD-10-CM

## 2024-07-07 DIAGNOSIS — K769 Liver disease, unspecified: Secondary | ICD-10-CM

## 2024-07-07 DIAGNOSIS — N179 Acute kidney failure, unspecified: Secondary | ICD-10-CM

## 2024-07-07 DIAGNOSIS — E876 Hypokalemia: Secondary | ICD-10-CM

## 2024-07-07 DIAGNOSIS — E8721 Acute metabolic acidosis: Secondary | ICD-10-CM

## 2024-07-07 DIAGNOSIS — F1290 Cannabis use, unspecified, uncomplicated: Secondary | ICD-10-CM | POA: Diagnosis present

## 2024-07-07 DIAGNOSIS — W19XXXA Unspecified fall, initial encounter: Secondary | ICD-10-CM | POA: Diagnosis present

## 2024-07-07 DIAGNOSIS — Z96643 Presence of artificial hip joint, bilateral: Secondary | ICD-10-CM | POA: Diagnosis present

## 2024-07-07 DIAGNOSIS — S50311A Abrasion of right elbow, initial encounter: Secondary | ICD-10-CM | POA: Diagnosis present

## 2024-07-07 DIAGNOSIS — K760 Fatty (change of) liver, not elsewhere classified: Secondary | ICD-10-CM | POA: Diagnosis present

## 2024-07-07 DIAGNOSIS — K861 Other chronic pancreatitis: Secondary | ICD-10-CM | POA: Diagnosis present

## 2024-07-07 DIAGNOSIS — R570 Cardiogenic shock: Secondary | ICD-10-CM | POA: Diagnosis not present

## 2024-07-07 DIAGNOSIS — R7881 Bacteremia: Secondary | ICD-10-CM

## 2024-07-07 DIAGNOSIS — E872 Acidosis, unspecified: Secondary | ICD-10-CM | POA: Diagnosis present

## 2024-07-07 DIAGNOSIS — K7040 Alcoholic hepatic failure without coma: Secondary | ICD-10-CM | POA: Diagnosis present

## 2024-07-07 DIAGNOSIS — R4182 Altered mental status, unspecified: Secondary | ICD-10-CM

## 2024-07-07 DIAGNOSIS — N170 Acute kidney failure with tubular necrosis: Secondary | ICD-10-CM | POA: Diagnosis present

## 2024-07-07 DIAGNOSIS — J189 Pneumonia, unspecified organism: Secondary | ICD-10-CM | POA: Diagnosis present

## 2024-07-07 DIAGNOSIS — A419 Sepsis, unspecified organism: Secondary | ICD-10-CM

## 2024-07-07 DIAGNOSIS — R6521 Severe sepsis with septic shock: Secondary | ICD-10-CM | POA: Diagnosis present

## 2024-07-07 DIAGNOSIS — K7682 Hepatic encephalopathy: Secondary | ICD-10-CM | POA: Diagnosis present

## 2024-07-07 DIAGNOSIS — F10231 Alcohol dependence with withdrawal delirium: Secondary | ICD-10-CM | POA: Diagnosis present

## 2024-07-07 DIAGNOSIS — G929 Unspecified toxic encephalopathy: Secondary | ICD-10-CM

## 2024-07-07 DIAGNOSIS — D61818 Other pancytopenia: Secondary | ICD-10-CM | POA: Diagnosis present

## 2024-07-07 DIAGNOSIS — E8809 Other disorders of plasma-protein metabolism, not elsewhere classified: Secondary | ICD-10-CM | POA: Diagnosis present

## 2024-07-07 DIAGNOSIS — K8020 Calculus of gallbladder without cholecystitis without obstruction: Secondary | ICD-10-CM | POA: Diagnosis present

## 2024-07-07 DIAGNOSIS — K529 Noninfective gastroenteritis and colitis, unspecified: Secondary | ICD-10-CM | POA: Diagnosis present

## 2024-07-07 LAB — CBC WITH DIFFERENTIAL/PLATELET
Abs Immature Granulocytes: 0.82 K/uL — ABNORMAL HIGH (ref 0.00–0.07)
Basophils Absolute: 0 K/uL (ref 0.0–0.1)
Basophils Relative: 0 %
Eosinophils Absolute: 0 K/uL (ref 0.0–0.5)
Eosinophils Relative: 0 %
HCT: 19.8 % — ABNORMAL LOW (ref 39.0–52.0)
Hemoglobin: 6.6 g/dL — ABNORMAL LOW (ref 13.0–17.0)
Immature Granulocytes: 5 %
Lymphocytes Relative: 12 %
Lymphs Abs: 1.9 K/uL (ref 0.7–4.0)
MCH: 37.1 pg — ABNORMAL HIGH (ref 26.0–34.0)
MCHC: 33.3 g/dL (ref 30.0–36.0)
MCV: 111.2 fL — ABNORMAL HIGH (ref 80.0–100.0)
Monocytes Absolute: 1.4 K/uL — ABNORMAL HIGH (ref 0.1–1.0)
Monocytes Relative: 9 %
Neutro Abs: 11.1 K/uL — ABNORMAL HIGH (ref 1.7–7.7)
Neutrophils Relative %: 74 %
Platelets: 6 K/uL — CL (ref 150–400)
RBC: 1.78 MIL/uL — ABNORMAL LOW (ref 4.22–5.81)
RDW: 16.4 % — ABNORMAL HIGH (ref 11.5–15.5)
WBC: 15.3 K/uL — ABNORMAL HIGH (ref 4.0–10.5)
nRBC: 0.4 % — ABNORMAL HIGH (ref 0.0–0.2)

## 2024-07-07 LAB — URINE DRUG SCREEN, QUALITATIVE (ARMC ONLY)
Amphetamines, Ur Screen: NOT DETECTED
Barbiturates, Ur Screen: NOT DETECTED
Benzodiazepine, Ur Scrn: NOT DETECTED
Cannabinoid 50 Ng, Ur ~~LOC~~: NOT DETECTED
Cocaine Metabolite,Ur ~~LOC~~: NOT DETECTED
MDMA (Ecstasy)Ur Screen: NOT DETECTED
Methadone Scn, Ur: NOT DETECTED
Opiate, Ur Screen: NOT DETECTED
Phencyclidine (PCP) Ur S: NOT DETECTED
Tricyclic, Ur Screen: NOT DETECTED

## 2024-07-07 LAB — GLUCOSE, CAPILLARY
Glucose-Capillary: 91 mg/dL (ref 70–99)
Glucose-Capillary: 108 mg/dL — ABNORMAL HIGH (ref 70–99)

## 2024-07-07 LAB — URINALYSIS, ROUTINE W REFLEX MICROSCOPIC
Glucose, UA: NEGATIVE mg/dL
Ketones, ur: NEGATIVE mg/dL
Leukocytes,Ua: NEGATIVE
Nitrite: NEGATIVE
Protein, ur: 30 mg/dL — AB
Specific Gravity, Urine: 1.043 — ABNORMAL HIGH (ref 1.005–1.030)
pH: 5 (ref 5.0–8.0)

## 2024-07-07 LAB — BASIC METABOLIC PANEL WITH GFR
Anion gap: 18 — ABNORMAL HIGH (ref 5–15)
BUN: 37 mg/dL — ABNORMAL HIGH (ref 8–23)
CO2: 18 mmol/L — ABNORMAL LOW (ref 22–32)
Calcium: 8.4 mg/dL — ABNORMAL LOW (ref 8.9–10.3)
Chloride: 96 mmol/L — ABNORMAL LOW (ref 98–111)
Creatinine, Ser: 1.68 mg/dL — ABNORMAL HIGH (ref 0.61–1.24)
GFR, Estimated: 45 mL/min — ABNORMAL LOW (ref >60)
Glucose, Bld: 79 mg/dL (ref 70–99)
Potassium: 2.9 mmol/L — ABNORMAL LOW (ref 3.5–5.1)
Sodium: 132 mmol/L — ABNORMAL LOW (ref 135–145)

## 2024-07-07 LAB — RESPIRATORY PANEL BY PCR

## 2024-07-07 LAB — HEPATIC FUNCTION PANEL
ALT: 30 U/L (ref 0–44)
AST: 128 U/L — ABNORMAL HIGH (ref 15–41)
Albumin: 1.8 g/dL — ABNORMAL LOW (ref 3.5–5.0)
Alkaline Phosphatase: 163 U/L — ABNORMAL HIGH (ref 38–126)
Bilirubin, Direct: 4.6 mg/dL — ABNORMAL HIGH (ref 0.0–0.2)
Indirect Bilirubin: 3.7 mg/dL — ABNORMAL HIGH (ref 0.3–0.9)
Total Bilirubin: 8.3 mg/dL — ABNORMAL HIGH (ref 0.0–1.2)
Total Protein: 6.5 g/dL (ref 6.5–8.1)

## 2024-07-07 LAB — RESP PANEL BY RT-PCR (RSV, FLU A&B, COVID)  RVPGX2
Influenza A by PCR: NEGATIVE
Influenza B by PCR: NEGATIVE
Resp Syncytial Virus by PCR: NEGATIVE
SARS Coronavirus 2 by RT PCR: NEGATIVE

## 2024-07-07 LAB — BLOOD GAS, VENOUS
Acid-base deficit: 7.9 mmol/L — ABNORMAL HIGH (ref 0.0–2.0)
Bicarbonate: 16.9 mmol/L — ABNORMAL LOW (ref 20.0–28.0)
O2 Saturation: 45.8 %
Patient temperature: 37
pCO2, Ven: 32 mmHg — ABNORMAL LOW (ref 44–60)
pH, Ven: 7.33 (ref 7.25–7.43)
pO2, Ven: 33 mmHg (ref 32–45)

## 2024-07-07 LAB — MRSA NEXT GEN BY PCR, NASAL: MRSA by PCR Next Gen: NOT DETECTED

## 2024-07-07 LAB — LACTIC ACID, PLASMA
Lactic Acid, Venous: >9 mmol/L (ref 0.5–1.9)
Lactic Acid, Venous: 8.8 mmol/L (ref 0.5–1.9)
Lactic Acid, Venous: 6.4 mmol/L (ref 0.5–1.9)

## 2024-07-07 LAB — PREPARE RBC (CROSSMATCH)

## 2024-07-07 LAB — PROTIME-INR
INR: 1.7 — ABNORMAL HIGH (ref 0.8–1.2)
Prothrombin Time: 20.7 s — ABNORMAL HIGH (ref 11.4–15.2)

## 2024-07-07 LAB — PHOSPHORUS: Phosphorus: 1.4 mg/dL — ABNORMAL LOW (ref 2.5–4.6)

## 2024-07-07 LAB — VITAMIN B12: Vitamin B-12: 2972 pg/mL — ABNORMAL HIGH (ref 180–914)

## 2024-07-07 LAB — LIPASE, BLOOD: Lipase: 16 U/L (ref 11–51)

## 2024-07-07 LAB — AMMONIA: Ammonia: 46 umol/L — ABNORMAL HIGH (ref 9–35)

## 2024-07-07 LAB — TROPONIN I (HIGH SENSITIVITY)
Troponin I (High Sensitivity): 171 ng/L (ref <18)
Troponin I (High Sensitivity): 154 ng/L (ref <18)

## 2024-07-07 LAB — MAGNESIUM: Magnesium: 1.6 mg/dL — ABNORMAL LOW (ref 1.7–2.4)

## 2024-07-07 LAB — ETHANOL: Alcohol, Ethyl (B): <15 mg/dL (ref <15)

## 2024-07-07 LAB — FOLATE: Folate: 8.1 ng/mL (ref >5.9)

## 2024-07-07 LAB — HIV ANTIBODY (ROUTINE TESTING W REFLEX): HIV Screen 4th Generation wRfx: NONREACTIVE

## 2024-07-07 LAB — ABO/RH: ABO/RH(D): A POS

## 2024-07-07 LAB — ACETAMINOPHEN LEVEL: Acetaminophen (Tylenol), Serum: <10 ug/mL — ABNORMAL LOW (ref 10–30)

## 2024-07-07 MED ORDER — LORAZEPAM 2 MG/ML IJ SOLN
1.0000 mg | INTRAMUSCULAR | Status: DC | PRN
  Administered 2024-07-07 – 2024-07-09 (×4): 1 mg via INTRAVENOUS
  Filled 2024-07-07 (×4): qty 1

## 2024-07-07 MED ORDER — THIAMINE HCL 100 MG/ML IJ SOLN
500.0000 mg | Freq: Every day | INTRAMUSCULAR | Status: AC
  Administered 2024-07-07 – 2024-07-10 (×4): 500 mg via INTRAVENOUS
  Filled 2024-07-07 (×4): qty 5

## 2024-07-07 MED ORDER — FOLIC ACID 5 MG/ML IJ SOLN
1.0000 mg | Freq: Every day | INTRAMUSCULAR | Status: DC
Start: 2024-07-07 — End: 2024-07-14
  Administered 2024-07-07 – 2024-07-12 (×6): 1 mg via INTRAVENOUS
  Filled 2024-07-07 (×8): qty 0.2

## 2024-07-07 MED ORDER — SODIUM BICARBONATE 8.4 % IV SOLN
50.0000 meq | Freq: Once | INTRAVENOUS | Status: AC
  Administered 2024-07-07: 50 meq via INTRAVENOUS
  Filled 2024-07-07: qty 50

## 2024-07-07 MED ORDER — LACTATED RINGERS IV BOLUS
500.0000 mL | Freq: Once | INTRAVENOUS | Status: AC
  Administered 2024-07-07: 500 mL via INTRAVENOUS

## 2024-07-07 MED ORDER — PANTOPRAZOLE SODIUM 40 MG IV SOLR
40.0000 mg | Freq: Two times a day (BID) | INTRAVENOUS | Status: DC
  Administered 2024-07-07 – 2024-07-14 (×14): 40 mg via INTRAVENOUS
  Filled 2024-07-07 (×15): qty 10

## 2024-07-07 MED ORDER — METRONIDAZOLE 500 MG/100ML IV SOLN
500.0000 mg | Freq: Once | INTRAVENOUS | Status: AC
  Administered 2024-07-07: 500 mg via INTRAVENOUS
  Filled 2024-07-07: qty 100

## 2024-07-07 MED ORDER — SODIUM CHLORIDE 0.9 % IV SOLN
2.0000 g | Freq: Once | INTRAVENOUS | Status: AC
  Administered 2024-07-07: 2 g via INTRAVENOUS
  Filled 2024-07-07: qty 12.5

## 2024-07-07 MED ORDER — POTASSIUM CHLORIDE 10 MEQ/100ML IV SOLN
10.0000 meq | INTRAVENOUS | Status: AC
  Administered 2024-07-07 (×6): 10 meq via INTRAVENOUS
  Filled 2024-07-07 (×6): qty 100

## 2024-07-07 MED ORDER — MAGNESIUM SULFATE 4 GM/100ML IV SOLN
4.0000 g | Freq: Once | INTRAVENOUS | Status: AC
Start: 2024-07-07 — End: 2024-07-07
  Administered 2024-07-07: 4 g via INTRAVENOUS
  Filled 2024-07-07: qty 100

## 2024-07-07 MED ORDER — IOHEXOL 300 MG/ML  SOLN
100.0000 mL | Freq: Once | INTRAMUSCULAR | Status: AC | PRN
  Administered 2024-07-07: 75 mL via INTRAVENOUS

## 2024-07-07 MED ORDER — SODIUM CHLORIDE 0.9 % IV SOLN: 2.0000 g | INTRAVENOUS | Status: DC

## 2024-07-07 MED ORDER — CHLORHEXIDINE GLUCONATE CLOTH 2 % EX PADS
6.0000 | MEDICATED_PAD | Freq: Every day | CUTANEOUS | Status: DC
  Administered 2024-07-07 – 2024-07-14 (×8): 6 via TOPICAL
  Filled 2024-07-07: qty 6

## 2024-07-07 MED ORDER — POLYETHYLENE GLYCOL 3350 17 G PO PACK: 17.0000 g | PACK | Freq: Every day | ORAL | Status: DC | PRN

## 2024-07-07 MED ORDER — SODIUM CHLORIDE 0.9% IV SOLUTION
Freq: Once | INTRAVENOUS | Status: AC
  Filled 2024-07-07: qty 250

## 2024-07-07 MED ORDER — VANCOMYCIN HCL IN DEXTROSE 1-5 GM/200ML-% IV SOLN
1000.0000 mg | Freq: Once | INTRAVENOUS | Status: AC
  Administered 2024-07-07: 1000 mg via INTRAVENOUS
  Filled 2024-07-07: qty 200

## 2024-07-07 MED ORDER — LACTATED RINGERS IV BOLUS (SEPSIS)
1000.0000 mL | Freq: Once | INTRAVENOUS | Status: AC
  Administered 2024-07-07: 1000 mL via INTRAVENOUS

## 2024-07-07 MED ORDER — DOCUSATE SODIUM 100 MG PO CAPS: 100.0000 mg | ORAL_CAPSULE | Freq: Two times a day (BID) | ORAL | Status: DC | PRN

## 2024-07-07 MED ORDER — PIPERACILLIN-TAZOBACTAM 3.375 G IVPB
3.3750 g | Freq: Three times a day (TID) | INTRAVENOUS | Status: DC
  Administered 2024-07-07 – 2024-07-10 (×9): 3.375 g via INTRAVENOUS
  Filled 2024-07-07 (×10): qty 50

## 2024-07-07 MED ORDER — SODIUM PHOSPHATES 45 MMOLE/15ML IV SOLN
30.0000 mmol | Freq: Once | INTRAVENOUS | Status: AC
  Administered 2024-07-07: 30 mmol via INTRAVENOUS
  Filled 2024-07-07: qty 10

## 2024-07-07 MED ORDER — LORAZEPAM 1 MG PO TABS: 1.0000 mg | ORAL_TABLET | ORAL | Status: DC | PRN

## 2024-07-07 MED ORDER — LACTATED RINGERS IV BOLUS
1000.0000 mL | Freq: Once | INTRAVENOUS | Status: AC
  Administered 2024-07-07: 1000 mL via INTRAVENOUS

## 2024-07-07 MED ORDER — DEXTROSE 5 % IV SOLN
10.0000 mg | Freq: Once | INTRAVENOUS | Status: AC
  Administered 2024-07-07: 10 mg via INTRAVENOUS
  Filled 2024-07-07: qty 1

## 2024-07-07 MED ORDER — THIAMINE HCL 100 MG/ML IJ SOLN
100.0000 mg | Freq: Every day | INTRAMUSCULAR | Status: AC
  Administered 2024-07-11 – 2024-07-13 (×3): 100 mg via INTRAVENOUS
  Filled 2024-07-07 (×3): qty 2

## 2024-07-07 NOTE — Plan of Care (Signed)

## 2024-07-07 NOTE — ED Notes (Signed)
 Pt placed on bear hugger.

## 2024-07-07 NOTE — Consult Note (Signed)
 Pharmacy Antibiotic Note  Sergio Curry is a 63 y.o. male admitted on 07/07/2024 for AMS and weakness with concerns for intra-abdominal infection. Past medical history notable for history of seizures and daily drinking of a few beers.  Pharmacy has been consulted for Zosyn dosing.  On admission, patient afebrile, HR 108, sbp 95, WBC 15.3, lactate >9. Jaundice was noted in the ER. Imaging notable for findings suspicious for colitis and hepatomegaly and advanced hepatic steatosis.  Patient has been given cefepime x1, with x1 vancomycin and Flagyl currently scheduled.   Renal function concerning for AKI, however current CrCl does not necessitate reduced Zosyn dosing.  Plan: Start extended-infusion Zosyn 3.375 g IV q8h @ 2200 Monitor renal function with AM labs  Height: 5' 9 (175.3 cm) Weight: 66.8 kg (147 lb 3.2 oz) IBW/kg (Calculated) : 70.7  Temp (24hrs), Avg:95.8 F (35.4 C), Min:94.1 F (34.5 C), Max:97.5 F (36.4 C)  Recent Labs  Lab 07/07/24 1210  WBC 15.3*  CREATININE 1.68*  LATICACIDVEN >9.0*    Estimated Creatinine Clearance: 42 mL/min (A) (by C-G formula based on SCr of 1.68 mg/dL (H)).    No Known Allergies  Antimicrobials this admission: Zosyn 10/25 >> Vanc, cefepime, Flagyl x1  Dose adjustments this admission:   Microbiology results: 10/25 BCx: sent 10/25 MRS PCR: sent  10/25 Cdiff: sent  10/25 resp panel: sent  Thank you for allowing pharmacy to be a part of this patient's care.  Sergio Curry Sergio Curry 07/07/2024 2:56 PM

## 2024-07-07 NOTE — ED Triage Notes (Signed)
 Pt to ED BIB [facility] with c/o altered mental status and jaundice per family. Pt alert to person but slower to answer questions. Notice to be jaundiced with patichea on legs and arms. Pt reports this is not normal for him. Reports daily drinking of a few beers but denies hx of liver failure. Pt reports generalized pain. Per EMS, pt also noted to be hypothermic. Receied 500ml NS

## 2024-07-07 NOTE — ED Notes (Signed)
 ICU providers at bedside

## 2024-07-07 NOTE — Consult Note (Signed)
 PHARMACY CONSULT NOTE - ELECTROLYTES  Pharmacy Consult for Electrolyte Monitoring and Replacement   Recent Labs: Height: 5' 9 (175.3 cm) Weight: 66.8 kg (147 lb 3.2 oz) IBW/kg (Calculated) : 70.7 Estimated Creatinine Clearance: 42 mL/min (A) (by C-G formula based on SCr of 1.68 mg/dL (H)). Potassium (mmol/L)  Date Value  07/07/2024 2.9 (L)   Calcium (mg/dL)  Date Value  89/74/7974 8.4 (L)   Albumin (g/dL)  Date Value  89/74/7974 1.8 (L)   Sodium (mmol/L)  Date Value  07/07/2024 132 (L)   Corrected Ca: 10.2 mg/dL  Assessment  Sergio Curry is a 63 y.o. male presenting with altered mental status and jaundice. PMH significant for seizures and liver disease. Pharmacy has been consulted to monitor and replace electrolytes.  Diet: NPO MIVF: N/A Pertinent medications: N/A  Goal of Therapy: Electrolytes WNL  Plan:  K = 2.9, give Kcl 10 mEq IV x 6 Check BMP, Mg, Phos with AM labs  Thank you for allowing pharmacy to be a part of this patient's care.  Kayla JULIANNA Blew, PharmD Clinical Pharmacist 07/07/2024 2:30 PM

## 2024-07-07 NOTE — ED Provider Notes (Signed)
 [facility] Provider Note    Event Date/Time   First MD Initiated Contact with Patient 07/07/24 1132     (approximate)   History   Altered Mental Status and Jaundice   HPI  Sergio Curry is a 63 y.o. male with a history of seizures who presents with altered mental status and weakness.  Per EMS, the patient was found today to be confused and weak.  They also noted jaundice.  The patient has a history of daily drinking of a few beers.  He denies any known history of liver disease.  He reports right-sided arm pain.  She denies other acute symptoms other than feeling generally unwell.  I reviewed the past medical records.  The patient's most recent outpatient encounters in our system were in 2021 with urgent care for ear pain.  He has no recent ED visits or hospitalizations.   Physical Exam   Triage Vital Signs: ED Triage Vitals  Encounter Vitals Group     BP      Girls Systolic BP Percentile      Girls Diastolic BP Percentile      Boys Systolic BP Percentile      Boys Diastolic BP Percentile      Pulse      Resp      Temp      Temp src      SpO2      Weight      Height      Head Circumference      Peak Flow      Pain Score      Pain Loc      Pain Education      Exclude from Growth Chart     Most recent vital signs: Vitals:   07/07/24 1414 07/07/24 1517  BP: 118/63 112/77  Pulse: (!) 108 88  Resp: 17 20  Temp: (!) 97.5 F (36.4 C) (!) 97.3 F (36.3 C)  SpO2: 100% 98%     General: Alert, oriented x 1, very weak appearing, no distress.  CV:  Good peripheral perfusion.  Resp:  Normal effort.  Lungs CTAB. Abd:  Soft with no focal tenderness.  No distention.  Other:  EOMI.  PERRLA.  No facial droop.  Motor intact all extremities.  Dry mucous membranes.  Jaundice and scleral icterus.   ED Results / Procedures / Treatments   Labs (all labs ordered are listed, but only abnormal results are displayed) Labs Reviewed  BASIC METABOLIC  PANEL WITH GFR - Abnormal; Notable for the following components:      Result Value   Sodium 132 (*)    Potassium 2.9 (*)    Chloride 96 (*)    CO2 18 (*)    BUN 37 (*)    Creatinine, Ser 1.68 (*)    Calcium 8.4 (*)    GFR, Estimated 45 (*)    Anion gap 18 (*)    All other components within normal limits  HEPATIC FUNCTION PANEL - Abnormal; Notable for the following components:   Albumin 1.8 (*)    AST 128 (*)    Alkaline Phosphatase 163 (*)    Total Bilirubin 8.3 (*)    Bilirubin, Direct 4.6 (*)    Indirect Bilirubin 3.7 (*)    All other components within normal limits  LACTIC ACID, PLASMA - Abnormal; Notable for the following components:   Lactic Acid, Venous >9.0 (*)    All other components within normal limits  CBC WITH DIFFERENTIAL/PLATELET - Abnormal; Notable for the following components:   WBC 15.3 (*)    RBC 1.78 (*)    Hemoglobin 6.6 (*)    HCT 19.8 (*)    MCV 111.2 (*)    MCH 37.1 (*)    RDW 16.4 (*)    Platelets 6 (*)    nRBC 0.4 (*)    Neutro Abs 11.1 (*)    Monocytes Absolute 1.4 (*)    Abs Immature Granulocytes 0.82 (*)    All other components within normal limits  AMMONIA - Abnormal; Notable for the following components:   Ammonia 46 (*)    All other components within normal limits  PROTIME-INR - Abnormal; Notable for the following components:   Prothrombin Time 20.7 (*)    INR 1.7 (*)    All other components within normal limits  MAGNESIUM - Abnormal; Notable for the following components:   Magnesium 1.6 (*)    All other components within normal limits  PHOSPHORUS - Abnormal; Notable for the following components:   Phosphorus 1.4 (*)    All other components within normal limits  LACTIC ACID, PLASMA - Abnormal; Notable for the following components:   Lactic Acid, Venous 8.8 (*)    All other components within normal limits  ACETAMINOPHEN LEVEL - Abnormal; Notable for the following components:   Acetaminophen (Tylenol), Serum <10 (*)    All other  components within normal limits  TROPONIN I (HIGH SENSITIVITY) - Abnormal; Notable for the following components:   Troponin I (High Sensitivity) 171 (*)    All other components within normal limits  TROPONIN I (HIGH SENSITIVITY) - Abnormal; Notable for the following components:   Troponin I (High Sensitivity) 154 (*)    All other components within normal limits  CULTURE, BLOOD (ROUTINE X 2)  CULTURE, BLOOD (ROUTINE X 2)  MRSA NEXT GEN BY PCR, NASAL  RESP PANEL BY RT-PCR (RSV, FLU A&B, COVID)  RVPGX2  RESPIRATORY PANEL BY PCR  C DIFFICILE QUICK SCREEN W PCR REFLEX    ETHANOL  LIPASE, BLOOD  URINALYSIS, ROUTINE W REFLEX MICROSCOPIC  URINE DRUG SCREEN, QUALITATIVE ([facility] ONLY)  BLOOD GAS, VENOUS  HIV ANTIBODY (ROUTINE TESTING W REFLEX)  FOLATE  VITAMIN B12  POTASSIUM  MAGNESIUM  PHOSPHORUS  CBC WITH DIFFERENTIAL/PLATELET  TYPE AND SCREEN  PREPARE RBC (CROSSMATCH)  ABO/RH  PREPARE PLATELET PHERESIS     EKG  ED ECG REPORT I, Waylon Cassis, the attending physician, personally viewed and interpreted this ECG.  Date: 07/07/2024 EKG Time: 1310 Rate: 96 Rhythm: normal sinus rhythm QRS Axis: Borderline right axis Intervals: normal ST/T Wave abnormalities: Repolarization abnormality, nonspecific ST abnormalities Narrative Interpretation: Equivocal findings for diffuse ischemia    RADIOLOGY  Chest x-ray: I independently viewed and interpreted the images; there is no focal consolidation or edema  CT head: No ICH  CT abdomen/pelvis:  IMPRESSION:  1. Hepatomegaly and advanced hepatic steatosis. There may be subtle  capsular nodularity, although motion obscures assessment. Recommend  correlation with any clinical or laboratory findings of cirrhosis.  2. Mild wall thickening about the ascending and hepatic flexure of  the colon, suspicious for colitis.  3. Cholelithiasis without gallbladder inflammation.  4. Absent renal excretion on delayed phase imaging, often seen  with  renal dysfunction.    PROCEDURES:  Critical Care performed: Yes, see critical care procedure note(s)  .Critical Care  Performed by: Cassis Waylon, MD Authorized by: Cassis Waylon, MD   Critical care provider statement:  Critical care time (minutes):  40   Critical care time was exclusive of:  Separately billable procedures and treating other patients   Critical care was necessary to treat or prevent imminent or life-threatening deterioration of the following conditions:  Hepatic failure and sepsis   Critical care was time spent personally by me on the following activities:  Development of treatment plan with patient or surrogate, discussions with consultants, evaluation of patient's response to treatment, examination of patient, ordering and review of laboratory studies, ordering and review of radiographic studies, ordering and performing treatments and interventions, pulse oximetry, re-evaluation of patient's condition, review of old charts and obtaining history from patient or surrogate   Care discussed with: admitting provider      MEDICATIONS ORDERED IN ED: Medications  0.9 %  sodium chloride  infusion (Manually program via Guardrails IV Fluids) (has no administration in time range)  lactated ringers bolus 1,000 mL (1,000 mLs Intravenous New Bag/Given 07/07/24 1525)  metroNIDAZOLE (FLAGYL) IVPB 500 mg (has no administration in time range)  vancomycin (VANCOCIN) IVPB 1000 mg/200 mL premix (has no administration in time range)  Chlorhexidine Gluconate Cloth 2 % PADS 6 each (6 each Topical Given 07/07/24 1528)  docusate sodium (COLACE) capsule 100 mg (has no administration in time range)  polyethylene glycol (MIRALAX / GLYCOLAX) packet 17 g (has no administration in time range)  potassium chloride 10 mEq in 100 mL IVPB (has no administration in time range)  pantoprazole (PROTONIX) injection 40 mg (has no administration in time range)  LORazepam  (ATIVAN ) tablet 1-4  mg (has no administration in time range)    Or  LORazepam  (ATIVAN ) injection 1-4 mg (has no administration in time range)  folic acid injection 1 mg (1 mg Intravenous Given 07/07/24 1527)  thiamine (VITAMIN B1) 500 mg in sodium chloride  0.9 % 50 mL IVPB (has no administration in time range)    Followed by  thiamine (VITAMIN B1) injection 100 mg (has no administration in time range)  phytonadione (VITAMIN K) 10 mg in dextrose 5 % 50 mL IVPB (10 mg Intravenous New Bag/Given 07/07/24 1530)  piperacillin-tazobactam (ZOSYN) IVPB 3.375 g (has no administration in time range)  magnesium sulfate IVPB 4 g 100 mL (has no administration in time range)  sodium phosphate 30 mmol in sodium chloride  0.9 % 250 mL infusion (has no administration in time range)  lactated ringers bolus 500 mL (0 mLs Intravenous Stopped 07/07/24 1407)  lactated ringers bolus 1,000 mL (1,000 mLs Intravenous New Bag/Given 07/07/24 1408)  ceFEPIme (MAXIPIME) 2 g in sodium chloride  0.9 % 100 mL IVPB (0 g Intravenous Stopped 07/07/24 1438)  iohexol (OMNIPAQUE) 300 MG/ML solution 100 mL (75 mLs Intravenous Contrast Given 07/07/24 1346)     IMPRESSION / MDM / ASSESSMENT AND PLAN / ED COURSE  I reviewed the triage vital signs and the nursing notes.  63 year old male with PMH as noted above presents with weakness, altered mental status, and jaundice.  On exam he is borderline hypotensive and somewhat hypothermic.  Differential diagnosis includes, but is not limited to, acute infection/sepsis, cirrhosis, liver failure, hepatic encephalopathy, AKI, other metabolic disturbance, stroke or other primary CNS cause.  We will obtain CT head, chest x-ray, lab workup, and reassess.  Patient's presentation is most consistent with acute presentation with potential threat to life or bodily function.  The patient is on the cardiac monitor to evaluate for evidence of arrhythmia and/or significant heart rate changes.    ----------------------------------------- 2:50 PM on 07/07/2024 -----------------------------------------  Lab workup  shows multiple concerning findings.  CBC shows leukocytosis, anemia with a hemoglobin of 6.6, and severe thrombocytopenia.  BMP is significant for elevated creatinine and low potassium as well as an elevated anion gap.  LFTs show elevated bilirubin.  Lactic acid is greater than 9.  Troponin is 150 consistent with demand ischemia.  Presentation is concerning for liver disease.  CT head shows no acute findings.  CT abdomen shows hepatic findings as well as possible colitis.  The patient's friend [name] is now here.  She advised that the patient does not have any family available.  He does not have a designated POA.  However the patient is still alert and although somewhat confused is able to answer questions.  He consents to blood transfusion.  I have ordered 2 units of PRBCs as well as transfusion of platelets due to his severe thrombocytopenia.  I have ordered empiric antibiotics as well as fluids per the sepsis protocol given lactic acidosis and leukocytosis.  The patient's blood pressure has improved.  I consulted Dr. Isaiah from the ICU; based on our discussion he agrees to evaluate the patient for admission.   FINAL CLINICAL IMPRESSION(S) / ED DIAGNOSES   Final diagnoses:  Sepsis with encephalopathy, due to unspecified organism, unspecified whether septic shock present (HCC)  Liver disease  Thrombocytopenia  Anemia, unspecified type  Altered mental status, unspecified altered mental status type     Rx / DC Orders   ED Discharge Orders     None        Note:  This document was prepared using Dragon voice recognition software and may include unintentional dictation errors.    Jacolyn Pae, MD 07/07/24 1534

## 2024-07-07 NOTE — H&P (Signed)
 NAME:  Sergio Curry, MRN:  [id number], DOB:  Nov 18, 1960, LOS: 0 ADMISSION DATE:  07/07/2024, CONSULTATION DATE: 07/07/2024 REFERRING MD: Dr. Jacolyn, CHIEF COMPLAINT: Altered Mental Status  History of Present Illness:  This is a 63 yo male who presented to [facility] on 10/25 via EMS with altered mental status and jaundice.  Per ED notes EMS reported when they arrived on the scene pt hypothermic temp 93.9 F, therefore they administered 500 ml NS bolus.    ED Course  Upon arrival to the ER pt reported he does have liver disease and drinks a few beers a day. Initial vital signs were: temp 94.1 F/sbp 97/hr 96/rr 20/O2 sats 94% on RA.  Significant lab results were: Na+132/K+ 2.9/chloride 96/CO2 18/BUN 37/creatinine 1.68/calcium 8.4/anion 8.4/alk phos 163/albumin 1.8/AST 128/ammonia 46/total bilirubin 8.3/troponin 171/lactic >9.0/wbc 15.3/hgb 6.6/platelet count 6/PT 20.7/INR 1.7/alcohol level <15.  Pt denies bloody stools, melena, or vomiting blood.  He denies taking blood thinners.  CT Head negative.  While in the ER pt received 2.5L LR bolus and flagyl/vancomycin ordered.  CT Abd/Pelvis revealed hepatomegaly and advanced hepatic steatosis, possible colitis, and cholelithiasis without gall bladder inflammation.  PCCM team contacted for ICU admission.    Pertinent  Medical History  Alcoholic Withdrawal Seizures   Significant Hospital Events: Including procedures, antibiotic start and stop dates in addition to other pertinent events   10/25: Admitted with acute toxic metabolic encephalopathy, sepsis, possible colitis, alcoholic hepatitis, severe lactic acidosis, macrocytic anemia, and pancytopenia   Interim History / Subjective:  Pts vital signs currently stable not requiring vasopressors. No signs of distress.  Main complaint is abdominal pain with palpation   Objective    Blood pressure 95/83, pulse 100, temperature (!) 94.1 F (34.5 C), resp. rate 15, height 5' 9 (1.753 m), weight 66.8 kg,  SpO2 100%.       No intake or output data in the 24 hours ending 07/07/24 1350 Filed Weights   07/07/24 1203  Weight: 66.8 kg   Examination: General: Acute on chronically-ill appearing male, NAD on RA  HENT: Supple, no JVD, scleral icterus  Lungs: Clear throughout, even, non labored  Cardiovascular: Sinus tachycardia, s1s2, no r/g, 2+ radial/2+ distal pulses, no edema  Abdomen: +BS x4, soft, diffuse tenderness, non distended  Extremities: Moves all extremities  Neuro: Awake but confused to situation intermittently, follows commands, PERRLA Skin: Severely jaundiced and generalized petechiae  GU: Deferred   Resolved problem list   Assessment and Plan   #Acute toxic metabolic encephalopathy  #ETOH Abuse  #Hyperammonemia  - Correct metabolic derangements  - High dose thiamine x4 days followed by 100 mg daily and folic acid daily - Prn ativan  per CIWA protocol  - Maintain sleep/wake cycle   #Elevated troponin's suspect secondary to demand ischemia  - Continuous telemetry monitoring  - Maintain map >65 - Trend troponin's until peaked  - Echo pending   #Acute kidney injury suspect secondary to ATN  #Anion gap metabolic acidosis  #Severe lactic acidosis  #Hyponatremia  #Hypokalemia  - Trend BMP and lactic acid  - Replace electrolytes as indicated  - Strict I&O's - Avoid nephrotoxic agents as able   #Alcoholic hepatitis  #Elevated alk phos  #Hyperbilirubinemia CT Abd Pelvis W Contrast 10/25: Hepatomegaly and advanced hepatic steatosis. There may be subtle capsular nodularity, although motion obscures assessment. Recommend correlation with any clinical or laboratory findings of cirrhosis. Mild wall thickening about the ascending and hepatic flexure of the colon, suspicious for colitis. Cholelithiasis without gallbladder inflammation.  Absent renal excretion on delayed phase imaging, often seen with renal dysfunction. Aortic Atherosclerosis - Trend hepatic function panel  - US   Abd Limited With Liver Doppler pending  - Gastroenterology consulted appreciate input  #Sepsis  #Possible colitis  - Trend WBC and monitor fever curve  - Follow cultures  - Will start zosyn for intraabdominal coverage for now pending culture results and sensitivities  - Cdiff testing per GI recommendations   #Macrocytic anemia  #Anemia without signs of bleeding  #Severe thrombocytopenia due to alcoholic hepatitis  #Elevated coags  - Trend CBC  - Monitor for s/sx of bleeding - Vitamin B12 and folic acid levels pending  - 10 mg vitamin K x1 dose  - Pending transfusion 3 units of platelets and 2 units of pRBC's  - Avoid chemical VTE px; SCD's for VTE px   - PPI q12hrs   #Endo - CBG's q4hrs  - Follow hypo/hyperglycemic protocol  - Target CBG readings 140 to 180   #Protein malnutrition  - NPO for now  - Will consult dietitian once pt able to have po's   Labs   CBC: Recent Labs  Lab 07/07/24 1210  WBC 15.3*  NEUTROABS 11.1*  HGB 6.6*  HCT 19.8*  MCV 111.2*  PLT 6*    Basic Metabolic Panel: Recent Labs  Lab 07/07/24 1210  NA 132*  K 2.9*  CL 96*  CO2 18*  GLUCOSE 79  BUN 37*  CREATININE 1.68*  CALCIUM 8.4*   GFR: Estimated Creatinine Clearance: 42 mL/min (A) (by C-G formula based on SCr of 1.68 mg/dL (H)). Recent Labs  Lab 07/07/24 1210  WBC 15.3*  LATICACIDVEN >9.0*    Liver Function Tests: Recent Labs  Lab 07/07/24 1210  AST 128*  ALT 30  ALKPHOS 163*  BILITOT 8.3*  PROT 6.5  ALBUMIN 1.8*   Recent Labs  Lab 07/07/24 1210  LIPASE 16   Recent Labs  Lab 07/07/24 1210  AMMONIA 46*    ABG No results found for: PHART, PCO2ART, PO2ART, HCO3, TCO2, ACIDBASEDEF, O2SAT   Coagulation Profile: Recent Labs  Lab 07/07/24 1210  INR 1.7*    Cardiac Enzymes: No results for input(s): CKTOTAL, CKMB, CKMBINDEX, TROPONINI in the last 168 hours.  HbA1C: No results found for: HGBA1C  CBG: No results for input(s):  GLUCAP in the last 168 hours.  Review of Systems: Positives in BOLD   Gen: Denies fever, chills, weight change, fatigue, night sweats HEENT: Denies blurred vision, double vision, hearing loss, tinnitus, sinus congestion, rhinorrhea, sore throat, neck stiffness, dysphagia PULM: Denies shortness of breath, cough, sputum production, hemoptysis, wheezing CV: Denies chest pain, edema, orthopnea, paroxysmal nocturnal dyspnea, palpitations GI: abdominal pain, nausea, vomiting, diarrhea, hematochezia, melena, constipation, change in bowel habits GU: Denies dysuria, hematuria, polyuria, oliguria, urethral discharge Endocrine: Denies hot or cold intolerance, polyuria, polyphagia or appetite change Derm: Denies rash, dry skin, scaling or peeling skin change Heme: Denies easy bruising, bleeding, bleeding gums Neuro: headache, numbness, weakness, confusion, slurred speech, loss of memory or consciousness   Past Medical History:  He,  has a past medical history of Seizures (HCC).   Surgical History:  No past surgical history on file.   Social History:   reports that he has quit smoking. His smoking use included cigarettes. He has never used smokeless tobacco. He reports current alcohol use of about 7.0 standard drinks of alcohol per week. He reports current drug use. Drug: Marijuana.   Family History:  His family history is not  on file.   Allergies No Known Allergies   Home Medications  Prior to Admission medications   Medication Sig Start Date End Date Taking? Authorizing Provider  chlordiazePOXIDE  (LIBRIUM ) 25 MG capsule Take 1 capsule 5 times a day on day 1, then decrease by one capsule daily until gone 01/06/16   Trudy Dorn BRAVO, MD  ciprofloxacin -dexamethasone  (CIPRODEX ) OTIC suspension Place 4 drops into the right ear 2 (two) times daily. 05/22/20   LampteyAleene KIDD, MD  cloNIDine  (CATAPRES  - DOSED IN MG/24 HR) 0.1 mg/24hr patch Place 1 patch (0.1 mg total) onto the skin every 7  (seven) days. 01/06/16   Trudy Dorn BRAVO, MD     Critical care time: 60 minutes      Lonell Moose, AGNP  Pulmonary/Critical Care Pager [phone] (please enter 7 digits) PCCM Consult Pager [phone] (please enter 7 digits)

## 2024-07-08 ENCOUNTER — Inpatient Hospital Stay
Admit: 2024-07-08 | Discharge: 2024-07-08 | Disposition: A | Discharge status: Home / Self Care | Payer: Self-pay | Attending: Critical Care Medicine | Admitting: Critical Care Medicine

## 2024-07-08 LAB — COMPREHENSIVE METABOLIC PANEL WITH GFR
ALT: 25 U/L (ref 0–44)
AST: 100 U/L — ABNORMAL HIGH (ref 15–41)
Albumin: 1.7 g/dL — ABNORMAL LOW (ref 3.5–5.0)
Alkaline Phosphatase: 118 U/L (ref 38–126)
Anion gap: 14 (ref 5–15)
BUN: 40 mg/dL — ABNORMAL HIGH (ref 8–23)
CO2: 23 mmol/L (ref 22–32)
Calcium: 7.4 mg/dL — ABNORMAL LOW (ref 8.9–10.3)
Chloride: 96 mmol/L — ABNORMAL LOW (ref 98–111)
Creatinine, Ser: 1.42 mg/dL — ABNORMAL HIGH (ref 0.61–1.24)
GFR, Estimated: 55 mL/min — ABNORMAL LOW (ref >60)
Glucose, Bld: 128 mg/dL — ABNORMAL HIGH (ref 70–99)
Potassium: 2.9 mmol/L — ABNORMAL LOW (ref 3.5–5.1)
Sodium: 133 mmol/L — ABNORMAL LOW (ref 135–145)
Total Bilirubin: 8.3 mg/dL — ABNORMAL HIGH (ref 0.0–1.2)
Total Protein: 5.4 g/dL — ABNORMAL LOW (ref 6.5–8.1)

## 2024-07-08 LAB — CBC WITH DIFFERENTIAL/PLATELET
Abs Immature Granulocytes: 0.74 K/uL — ABNORMAL HIGH (ref 0.00–0.07)
Basophils Absolute: 0.1 K/uL (ref 0.0–0.1)
Basophils Relative: 0 %
Eosinophils Absolute: 0 K/uL (ref 0.0–0.5)
Eosinophils Relative: 0 %
HCT: 22.6 % — ABNORMAL LOW (ref 39.0–52.0)
Hemoglobin: 7.9 g/dL — ABNORMAL LOW (ref 13.0–17.0)
Immature Granulocytes: 4 %
Lymphocytes Relative: 13 %
Lymphs Abs: 2.3 K/uL (ref 0.7–4.0)
MCH: 33.5 pg (ref 26.0–34.0)
MCHC: 35 g/dL (ref 30.0–36.0)
MCV: 95.8 fL (ref 80.0–100.0)
Monocytes Absolute: 2.5 K/uL — ABNORMAL HIGH (ref 0.1–1.0)
Monocytes Relative: 14 %
Neutro Abs: 12.3 K/uL — ABNORMAL HIGH (ref 1.7–7.7)
Neutrophils Relative %: 69 %
Platelets: 25 K/uL — CL (ref 150–400)
RBC: 2.36 MIL/uL — ABNORMAL LOW (ref 4.22–5.81)
RDW: 19.6 % — ABNORMAL HIGH (ref 11.5–15.5)
WBC: 17.9 K/uL — ABNORMAL HIGH (ref 4.0–10.5)
nRBC: 0.2 % (ref 0.0–0.2)

## 2024-07-08 LAB — PROTIME-INR
INR: 1.5 — ABNORMAL HIGH (ref 0.8–1.2)
Prothrombin Time: 18.4 s — ABNORMAL HIGH (ref 11.4–15.2)

## 2024-07-08 LAB — BLOOD CULTURE ID PANEL (REFLEXED) - BCID2

## 2024-07-08 LAB — ECHOCARDIOGRAM COMPLETE
AV Peak grad: 8.2 mmHg
Ao pk vel: 1.43 m/s
Height: 69 in
S' Lateral: 4.7 cm
Single Plane A4C EF: 67.3 %
Weight: 2070.56 [oz_av]

## 2024-07-08 LAB — LACTIC ACID, PLASMA
Lactic Acid, Venous: 1.8 mmol/L (ref 0.5–1.9)
Lactic Acid, Venous: 1.3 mmol/L (ref 0.5–1.9)

## 2024-07-08 LAB — HEPATITIS PANEL, ACUTE
HCV Ab: NONREACTIVE
Hep A IgM: NONREACTIVE
Hep B C IgM: NONREACTIVE
Hepatitis B Surface Ag: NONREACTIVE

## 2024-07-08 LAB — GLUCOSE, CAPILLARY
Glucose-Capillary: 127 mg/dL — ABNORMAL HIGH (ref 70–99)
Glucose-Capillary: 120 mg/dL — ABNORMAL HIGH (ref 70–99)
Glucose-Capillary: 112 mg/dL — ABNORMAL HIGH (ref 70–99)
Glucose-Capillary: 106 mg/dL — ABNORMAL HIGH (ref 70–99)
Glucose-Capillary: 101 mg/dL — ABNORMAL HIGH (ref 70–99)
Glucose-Capillary: 98 mg/dL (ref 70–99)

## 2024-07-08 LAB — PHOSPHORUS: Phosphorus: 3.9 mg/dL (ref 2.5–4.6)

## 2024-07-08 LAB — POTASSIUM: Potassium: 3.2 mmol/L — ABNORMAL LOW (ref 3.5–5.1)

## 2024-07-08 LAB — MAGNESIUM: Magnesium: 2.3 mg/dL (ref 1.7–2.4)

## 2024-07-08 MED ORDER — POTASSIUM CHLORIDE CRYS ER 20 MEQ PO TBCR
20.0000 meq | EXTENDED_RELEASE_TABLET | Freq: Two times a day (BID) | ORAL | Status: AC
  Filled 2024-07-08: qty 1

## 2024-07-08 MED ORDER — LACTATED RINGERS IV BOLUS
500.0000 mL | Freq: Once | INTRAVENOUS | Status: AC
  Administered 2024-07-08: 500 mL via INTRAVENOUS

## 2024-07-08 MED ORDER — ORAL CARE MOUTH RINSE: 15.0000 mL | OROMUCOSAL | Status: DC | PRN

## 2024-07-08 MED ORDER — FENTANYL CITRATE (PF) 50 MCG/ML IJ SOSY: 25.0000 ug | PREFILLED_SYRINGE | Freq: Once | INTRAMUSCULAR | Status: DC

## 2024-07-08 MED ORDER — LACTULOSE ENEMA
300.0000 mL | Freq: Two times a day (BID) | ORAL | Status: DC
  Administered 2024-07-08 – 2024-07-10 (×5): 300 mL via RECTAL
  Filled 2024-07-08 (×9): qty 300

## 2024-07-08 MED ORDER — ALBUMIN HUMAN 25 % IV SOLN
12.5000 g | Freq: Once | INTRAVENOUS | Status: AC
  Administered 2024-07-08: 12.5 g via INTRAVENOUS
  Filled 2024-07-08: qty 50

## 2024-07-08 MED ORDER — POTASSIUM CHLORIDE 10 MEQ/100ML IV SOLN
10.0000 meq | INTRAVENOUS | Status: AC
  Administered 2024-07-08 (×4): 10 meq via INTRAVENOUS
  Filled 2024-07-08 (×4): qty 100

## 2024-07-08 NOTE — Plan of Care (Signed)
  Problem: Education: Goal: Knowledge of General Education information will improve Description: Including pain rating scale, medication(s)/side effects and non-pharmacologic comfort measures Outcome: Progressing    Problem: Clinical Measurements: Goal: Ability to maintain clinical measurements within normal limits will improve Outcome: Progressing   Problem: Clinical Measurements: Goal: Diagnostic test results will improve Outcome: Progressing   Problem: Clinical Measurements: Goal: Respiratory complications will improve Outcome: Progressing   Problem: Clinical Measurements: Goal: Cardiovascular complication will be avoided Outcome: Progressing   Problem: Coping: Goal: Level of anxiety will decrease Outcome: Progressing

## 2024-07-08 NOTE — Progress Notes (Signed)
 NAME:  Sergio Curry, MRN:  [id number], DOB:  1961-08-16, LOS: 1 ADMISSION DATE:  07/07/2024, CONSULTATION DATE: 07/07/2024 REFERRING MD: Dr. Jacolyn, CHIEF COMPLAINT: Altered Mental Status  History of Present Illness:  This is a 63 yo male who presented to [facility] ER on 10/25 via EMS with altered mental status and jaundice.  Per ED notes EMS reported when they arrived on the scene pt hypothermic temp 93.9 F, therefore they administered 500 ml NS bolus.    ED Course  Upon arrival to the ER pt reported he does have liver disease and drinks a few beers a day. Initial vital signs were: temp 94.1 F/sbp 97/hr 96/rr 20/O2 sats 94% on RA.  Significant lab results were: Na+132/K+ 2.9/chloride 96/CO2 18/BUN 37/creatinine 1.68/calcium 8.4/anion 8.4/alk phos 163/albumin 1.8/AST 128/ammonia 46/total bilirubin 8.3/troponin 171/lactic >9.0/wbc 15.3/hgb 6.6/platelet count 6/PT 20.7/INR 1.7/alcohol level <15.  Pt denies bloody stools, melena, or vomiting blood.  He denies taking blood thinners.  CT Head negative.  While in the ER pt received 2.5L LR bolus and flagyl/vancomycin ordered.  CT Abd/Pelvis revealed hepatomegaly and advanced hepatic steatosis, possible colitis, and cholelithiasis without gall bladder inflammation.  PCCM team contacted for ICU admission.    Pertinent  Medical History  Alcoholic Withdrawal Seizures   Micro Data:   MRSA PCR 10/25>>negative  Resp panel by RT-PCR 10/25>>negative  RVP 10/25>>negative  Blood 10/25>>staph epi in 1 of 4 bottles likely contaminant   Anti-infectives (From admission, onward)    Start     Dose/Rate Route Frequency Ordered Stop   07/07/24 2200  piperacillin-tazobactam (ZOSYN) IVPB 3.375 g        3.375 g 12.5 mL/hr over 240 Minutes Intravenous Every 8 hours 07/07/24 1505     07/07/24 2100  cefTRIAXone (ROCEPHIN) 2 g in sodium chloride  0.9 % 100 mL IVPB  Status:  Discontinued        2 g 200 mL/hr over 30 Minutes Intravenous Every 24 hours 07/07/24 1439  07/07/24 1453   07/07/24 1330  ceFEPIme (MAXIPIME) 2 g in sodium chloride  0.9 % 100 mL IVPB        2 g 200 mL/hr over 30 Minutes Intravenous  Once 07/07/24 1325 07/07/24 1438   07/07/24 1330  metroNIDAZOLE (FLAGYL) IVPB 500 mg        500 mg 100 mL/hr over 60 Minutes Intravenous  Once 07/07/24 1325 07/07/24 1810   07/07/24 1330  vancomycin (VANCOCIN) IVPB 1000 mg/200 mL premix        1,000 mg 200 mL/hr over 60 Minutes Intravenous  Once 07/07/24 1325 07/07/24 1910      Significant Hospital Events: Including procedures, antibiotic start and stop dates in addition to other pertinent events   10/25: Admitted with acute toxic metabolic encephalopathy, sepsis, possible colitis, alcoholic hepatitis, severe lactic acidosis, macrocytic anemia, and pancytopenia.  Pt received 2 units of pRBC's and 2 units of platelets  10/26: Pt showed signs of ETOH withdrawal overnight requiring ativan  per CIWA protocol once.  CBC stable at this time.  GI consulted and will see pt today   Interim History / Subjective:  As outlined above under significant events    Objective    Blood pressure (!) 133/32, pulse 89, temperature 98 F (36.7 C), temperature source Axillary, resp. rate 17, height 5' 9 (1.753 m), weight 58.7 kg, SpO2 96%.        Intake/Output Summary (Last 24 hours) at 07/08/2024 0753 Last data filed at 07/08/2024 0541 Gross per 24 hour  Intake 5435.78  ml  Output 150 ml  Net 5285.78 ml   Filed Weights   07/07/24 1203 07/07/24 1517 07/08/24 0500  Weight: 66.8 kg 64 kg 58.7 kg   Examination: General: Acute on chronically-ill appearing male, NAD on RA  HENT: Supple, no JVD, scleral icterus  Lungs: Clear throughout, even, non labored  Cardiovascular: Sinus rhythm, s1s2, no r/g, 2+ radial/2+ distal pulses, no edema  Abdomen: +BS x4, soft, diffuse tenderness, non distended  Extremities: Moves all extremities  Neuro: Sedated not following commands, withdraws from painful stimulation, PERRL   Skin: Severely jaundiced and generalized petechiae  GU: External catheter draining tea colored urine   Resolved problem list   Assessment and Plan   #Acute toxic metabolic encephalopathy  #ETOH Abuse  #Hyperammonemia  - Correct metabolic derangements  - High dose thiamine x4 days followed by 100 mg daily and folic acid daily - Prn ativan  per CIWA protocol  - Maintain sleep/wake cycle   #Elevated troponin's suspect secondary to demand ischemia  - Continuous telemetry monitoring  - Maintain map >65 - Troponin peaked at 171 - Echo pending   #Acute kidney injury suspect secondary to ATN  #Anion gap metabolic acidosis  #Severe lactic acidosis  #Hyponatremia  #Hypokalemia  - Trend BMP and lactic acid  - Replace electrolytes as indicated  - Strict I&O's - Avoid nephrotoxic agents as able   #Alcoholic hepatitis  #Elevated alk phos  #Hyperbilirubinemia CT Abd Pelvis W Contrast 10/25: Hepatomegaly and advanced hepatic steatosis. There may be subtle capsular nodularity, although motion obscures assessment. Recommend correlation with any clinical or laboratory findings of cirrhosis. Mild wall thickening about the ascending and hepatic flexure of the colon, suspicious for colitis. Cholelithiasis without gallbladder inflammation. Absent renal excretion on delayed phase imaging, often seen with renal dysfunction. Aortic Atherosclerosis US  ABD LTD RUG W Liver Doppler 10/25: Limited evaluation. The visualized portal vasculature is patent with antegrade flow. Diffusely coarsened and echogenic hepatic parenchyma as could be seen with hepatic steatosis or acute hepatitis. No evidence of biliary ductal dilation - Trend hepatic function panel  - Acute hepatitis panel pending  - Gastroenterology consulted appreciate input  #Sepsis  #Possible colitis  - Trend WBC and monitor fever curve  - Follow cultures  - Continue zosyn for now pending culture results and sensitivities   #Macrocytic anemia   #Anemia without signs of bleeding  #Severe thrombocytopenia due to alcoholic hepatitis  #Elevated coags  - Trend CBC  - Monitor for s/sx of bleeding - Vitamin B12 and folic acid levels within normal limits  - Transfuse for hgb <7 and/or platelet count less than 10,000 and/or active signs of bleeding  - Avoid chemical VTE px; SCD's for VTE px   - PPI q12hrs   #Endo - CBG's q4hrs  - Follow hypo/hyperglycemic protocol  - Target CBG readings 140 to 180   #Protein malnutrition  - NPO for now  - Will administer albumin  - Will consult dietitian once pt able to have po's   Labs   CBC: Recent Labs  Lab 07/07/24 1210 07/08/24 0426  WBC 15.3* 17.9*  NEUTROABS 11.1* 12.3*  HGB 6.6* 7.9*  HCT 19.8* 22.6*  MCV 111.2* 95.8  PLT 6* 25*    Basic Metabolic Panel: Recent Labs  Lab 07/07/24 1210 07/08/24 0426  NA 132* 133*  K 2.9* 2.9*  CL 96* 96*  CO2 18* 23  GLUCOSE 79 128*  BUN 37* 40*  CREATININE 1.68* 1.42*  CALCIUM 8.4* 7.4*  MG 1.6* 2.3  PHOS 1.4* 3.9   GFR: Estimated Creatinine Clearance: 43.6 mL/min (A) (by C-G formula based on SCr of 1.42 mg/dL (H)). Recent Labs  Lab 07/07/24 1210 07/07/24 1433 07/07/24 1957 07/08/24 0426  WBC 15.3*  --   --  17.9*  LATICACIDVEN >9.0* 8.8* 6.4*  --     Liver Function Tests: Recent Labs  Lab 07/07/24 1210 07/08/24 0426  AST 128* 100*  ALT 30 25  ALKPHOS 163* 118  BILITOT 8.3* 8.3*  PROT 6.5 5.4*  ALBUMIN 1.8* 1.7*   Recent Labs  Lab 07/07/24 1210  LIPASE 16   Recent Labs  Lab 07/07/24 1210  AMMONIA 46*    ABG    Component Value Date/Time   HCO3 16.9 (L) 07/07/2024 1600   ACIDBASEDEF 7.9 (H) 07/07/2024 1600   O2SAT 45.8 07/07/2024 1600     Coagulation Profile: Recent Labs  Lab 07/07/24 1210  INR 1.7*    Cardiac Enzymes: No results for input(s): CKTOTAL, CKMB, CKMBINDEX, TROPONINI in the last 168 hours.  HbA1C: No results found for: HGBA1C  CBG: Recent Labs  Lab 07/07/24 1928  07/07/24 2328 07/08/24 0422 07/08/24 0740  GLUCAP 91 108* 127* 120*    Review of Systems: Positives in BOLD   Gen: Denies fever, chills, weight change, fatigue, night sweats HEENT: Denies blurred vision, double vision, hearing loss, tinnitus, sinus congestion, rhinorrhea, sore throat, neck stiffness, dysphagia PULM: Denies shortness of breath, cough, sputum production, hemoptysis, wheezing CV: Denies chest pain, edema, orthopnea, paroxysmal nocturnal dyspnea, palpitations GI: abdominal pain, nausea, vomiting, diarrhea, hematochezia, melena, constipation, change in bowel habits GU: Denies dysuria, hematuria, polyuria, oliguria, urethral discharge Endocrine: Denies hot or cold intolerance, polyuria, polyphagia or appetite change Derm: Denies rash, dry skin, scaling or peeling skin change Heme: Denies easy bruising, bleeding, bleeding gums Neuro: headache, numbness, weakness, confusion, slurred speech, loss of memory or consciousness   Past Medical History:  He,  has a past medical history of Seizures (HCC).   Surgical History:  No past surgical history on file.   Social History:   reports that he has quit smoking. His smoking use included cigarettes. He has never used smokeless tobacco. He reports current alcohol use of about 7.0 standard drinks of alcohol per week. He reports current drug use. Drug: Marijuana.   Family History:  His family history is not on file.   Allergies No Known Allergies   Home Medications  Prior to Admission medications   Medication Sig Start Date End Date Taking? Authorizing Provider  chlordiazePOXIDE  (LIBRIUM ) 25 MG capsule Take 1 capsule 5 times a day on day 1, then decrease by one capsule daily until gone 01/06/16   Trudy Dorn BRAVO, MD  ciprofloxacin -dexamethasone  (CIPRODEX ) OTIC suspension Place 4 drops into the right ear 2 (two) times daily. 05/22/20   LampteyAleene KIDD, MD  cloNIDine  (CATAPRES  - DOSED IN MG/24 HR) 0.1 mg/24hr patch Place 1 patch  (0.1 mg total) onto the skin every 7 (seven) days. 01/06/16   Trudy Dorn BRAVO, MD     Critical care time: 40 minutes      Lonell Moose, AGNP  Pulmonary/Critical Care Pager [phone] (please enter 7 digits) PCCM Consult Pager [phone] (please enter 7 digits)

## 2024-07-08 NOTE — Progress Notes (Addendum)
 PHARMACY - PHYSICIAN COMMUNICATION CRITICAL VALUE ALERT - BLOOD CULTURE IDENTIFICATION (BCID)  Sergio Curry is an 63 y.o. male who presented to [facility] on 07/07/2024 with a chief complaint of acute metabolic encephalopathy , possible intra-abdominal infection.   Assessment:  Staph epi in 4 of 4 bottles, no resistance detected.  Most likely a contaminant. (include suspected source if known)  Name of physician (or Provider) Contacted: Alm Cellar, Lonell Moose  Current antibiotics: Zosyn   Changes to prescribed antibiotics recommended:  Patient is on recommended antibiotics - No changes needed  No results found for this or any previous visit.  Robbins,Jason D 07/08/2024  3:30 AM

## 2024-07-08 NOTE — Plan of Care (Signed)
  Problem: Clinical Measurements: Goal: Respiratory complications will improve Outcome: Progressing   Problem: Elimination: Goal: Will not experience complications related to urinary retention Outcome: Progressing   Problem: Education: Goal: Knowledge of General Education information will improve Description: Including pain rating scale, medication(s)/side effects and non-pharmacologic comfort measures Outcome: Not Progressing   Problem: Health Behavior/Discharge Planning: Goal: Ability to manage health-related needs will improve Outcome: Not Progressing   Problem: Clinical Measurements: Goal: Ability to maintain clinical measurements within normal limits will improve Outcome: Not Progressing   Problem: Activity: Goal: Risk for activity intolerance will decrease Outcome: Not Progressing   Problem: Nutrition: Goal: Adequate nutrition will be maintained Outcome: Not Progressing   Problem: Coping: Goal: Level of anxiety will decrease Outcome: Not Progressing   Problem: Pain Managment: Goal: General experience of comfort will improve and/or be controlled Outcome: Not Progressing

## 2024-07-08 NOTE — Consult Note (Incomplete)
 CARDIOLOGY CONSULT NOTE               Patient ID: Sergio Curry MRN: [id number] DOB/AGE: 11-30-1960 63 y.o.  Admit date: 07/07/2024 Referring Physician *** Primary Physician *** Primary Cardiologist *** Reason for Consultation ***  HPI: ***  Review of systems complete and found to be negative unless listed above     Past Medical History:  Diagnosis Date   Seizures (HCC)     No past surgical history on file.  Medications Prior to Admission  Medication Sig Dispense Refill Last Dose/Taking   chlordiazePOXIDE  (LIBRIUM ) 25 MG capsule Take 1 capsule 5 times a day on day 1, then decrease by one capsule daily until gone (Patient not taking: Reported on 07/07/2024) 15 capsule 0 Not Taking   ciprofloxacin -dexamethasone  (CIPRODEX ) OTIC suspension Place 4 drops into the right ear 2 (two) times daily. (Patient not taking: Reported on 07/07/2024) 7.5 mL 0 Not Taking   cloNIDine  (CATAPRES  - DOSED IN MG/24 HR) 0.1 mg/24hr patch Place 1 patch (0.1 mg total) onto the skin every 7 (seven) days. (Patient not taking: Reported on 07/07/2024) 4 patch 11 Not Taking   Social History   Socioeconomic History   Marital status: Single    Spouse name: Not on file   Number of children: Not on file   Years of education: Not on file   Highest education level: Not on file  Occupational History   Not on file  Tobacco Use   Smoking status: Former    Types: Cigarettes   Smokeless tobacco: Never  Substance and Sexual Activity   Alcohol use: Yes    Alcohol/week: 7.0 standard drinks of alcohol    Types: 4 Cans of beer, 3 Shots of liquor per week   Drug use: Yes    Types: Marijuana   Sexual activity: Not Currently    Birth control/protection: None  Other Topics Concern   Not on file  Social History Narrative   Not on file   Social Drivers of Health   Financial Resource Strain: Not on file  Food Insecurity: No Food Insecurity (07/07/2024)   Hunger Vital Sign    Worried About Running Out  of Food in the Last Year: Never true    Ran Out of Food in the Last Year: Never true  Transportation Needs: No Transportation Needs (07/07/2024)   PRAPARE - Administrator, Civil Service (Medical): No    Lack of Transportation (Non-Medical): No  Physical Activity: Not on file  Stress: Not on file  Social Connections: Not on file  Intimate Partner Violence: Not At Risk (07/07/2024)   Humiliation, Afraid, Rape, and Kick questionnaire    Fear of Current or Ex-Partner: No    Emotionally Abused: No    Physically Abused: No    Sexually Abused: No    No family history on file.    Review of systems complete and found to be negative unless listed above      PHYSICAL EXAM  General: Well developed, well nourished, in no acute distress HEENT:  Normocephalic and atramatic Neck:  No JVD.  Lungs: Clear bilaterally to auscultation and percussion. Heart: HRRR . Normal S1 and S2 without gallops or murmurs.  Abdomen: Bowel sounds are positive, abdomen soft and non-tender  Msk:  Back normal, normal gait. Normal strength and tone for age. Extremities: No clubbing, cyanosis or edema.   Neuro: Alert and oriented X 3. Psych:  Good affect, responds appropriately  Labs:   Lab  Results  Component Value Date   WBC 17.9 (H) 07/08/2024   HGB 7.9 (L) 07/08/2024   HCT 22.6 (L) 07/08/2024   MCV 95.8 07/08/2024   PLT 25 (LL) 07/08/2024    Recent Labs  Lab 07/08/24 0426  NA 133*  K 2.9*  CL 96*  CO2 23  BUN 40*  CREATININE 1.42*  CALCIUM 7.4*  PROT 5.4*  BILITOT 8.3*  ALKPHOS 118  ALT 25  AST 100*  GLUCOSE 128*   Lab Results  Component Value Date   TROPONINI <0.03 01/06/2016   No results found for: CHOL No results found for: HDL No results found for: LDLCALC No results found for: TRIG No results found for: CHOLHDL No results found for: LDLDIRECT    Radiology: ECHOCARDIOGRAM COMPLETE Result Date: 07/08/2024    ECHOCARDIOGRAM REPORT   Patient Name:    Sergio Curry Date of Exam: 07/08/2024 Medical Rec #:  [id number]        Height:       69.0 in Accession #:    [id number]       Weight:       129.4 lb Date of Birth:  06/22/1961         BSA:          1.717 m Patient Age:    63 years         BP:           133/32 mmHg Patient Gender: M                HR:           84 bpm. Exam Location:  [facility] Procedure: 2D Echo, Cardiac Doppler and Color Doppler (Both Spectral and Color            Flow Doppler were utilized during procedure). Indications:     Elevated Troponin  History:         Patient has no prior history of Echocardiogram examinations.  Sonographer:     Thedora Louder RDCS, FASE Referring Phys:  [id number] LONELL KANDICE MOOSE Diagnosing Phys: Cara JONETTA Lovelace MD  Sonographer Comments: Technically difficult study due to poor echo windows. Image acquisition challenging due to patient behavioral factors. IMPRESSIONS  1. Possible SBE of anterior leaflet. Recommend TEE.  2. Technically difficult study.  3. Left ventricular ejection fraction, by estimation, is 45 to 50%. The left ventricle has mildly decreased function. The left ventricle demonstrates global hypokinesis. The left ventricular internal cavity size was moderately to severely dilated. Left ventricular diastolic function could not be evaluated.  4. Right ventricular systolic function is normal. The right ventricular size is normal.  5. Left atrial size was mildly dilated.  6. The mitral valve is myxomatous. Trivial mitral valve regurgitation.  7. 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 SBE can not be excluded. Consider TEE.. The aortic valve is calcified. Aortic valve regurgitation is mild to moderate. Aortic valve sclerosis/calcification is present, without any evidence of aortic stenosis. Conclusion(s)/Recommendation(s): Poor windows for evaluation of left ventricular function by transthoracic echocardiography. Would recommend an alternative means of evaluation. Findings concerning for  aortic valve vegetation, would recommend a Transesophageal Echocardiogram for clarification. FINDINGS  Left Ventricle: Left ventricular ejection fraction, by estimation, is 45 to 50%. The left ventricle has mildly decreased function. The left ventricle demonstrates global hypokinesis. Strain was performed and the global longitudinal strain is indeterminate. The left ventricular internal cavity size was moderately to severely dilated. There is no left ventricular  hypertrophy. Left ventricular diastolic function could not be evaluated. Right Ventricle: The right ventricular size is normal. No increase in right ventricular wall thickness. Right ventricular systolic function is normal. Left Atrium: Left atrial size was mildly dilated. Right Atrium: Right atrial size was normal in size. Pericardium: There is no evidence of pericardial effusion. Mitral Valve: The mitral valve is myxomatous. Trivial mitral valve regurgitation. Tricuspid Valve: The tricuspid valve is normal in structure. Tricuspid valve regurgitation is trivial. Aortic Valve: Mobile mass on anterior leaflet prolapsing inyo the outflow tract. SBE can not be excluded. Consider TEE. The aortic valve is calcified. Aortic valve regurgitation is mild to moderate. Aortic valve sclerosis/calcification is present, without any evidence of aortic stenosis. Aortic valve peak gradient measures 8.2 mmHg. Pulmonic Valve: The pulmonic valve was normal in structure. Pulmonic valve regurgitation is not visualized. Aorta: The ascending aorta was not well visualized. IAS/Shunts: No atrial level shunt detected by color flow Doppler. Additional Comments: Possible SBE of anterior leaflet. Recommend TEE. Technically difficult study. 3D was performed not requiring image post processing on an independent workstation and was indeterminate.  LEFT VENTRICLE PLAX 2D LVIDd:         6.10 cm LVIDs:         4.70 cm LV PW:         1.10 cm LV IVS:        1.00 cm  LV Volumes (MOD) LV vol d,  MOD A4C: 90.4 ml LV vol s, MOD A4C: 29.6 ml LV SV MOD A4C:     90.4 ml LEFT ATRIUM           Index LA diam:      4.10 cm 2.39 cm/m LA Vol (A4C): 26.0 ml 15.14 ml/m  AORTIC VALVE              PULMONIC VALVE AV Vmax:      143.00 cm/s RVOT Peak grad: 1 mmHg AV Peak Grad: 8.2 mmHg LVOT Vmax:    91.80 cm/s LVOT Vmean:   62.000 cm/s LVOT VTI:     0.196 m  AORTA Ao Asc diam: 3.10 cm  SHUNTS Systemic VTI: 0.20 m Cara JONETTA Lovelace MD Electronically signed by Cara JONETTA Lovelace MD Signature Date/Time: 07/08/2024/11:40:39 AM    Final    US  ABDOMEN LIMITED WITH LIVER DOPPLER Result Date: 07/08/2024 CLINICAL DATA:  63 year old male with history of hyperbilirubinemia. EXAM: DUPLEX ULTRASOUND OF LIVER TECHNIQUE: Color and duplex Doppler ultrasound was performed to evaluate the hepatic in-flow and out-flow vessels. COMPARISON:  None Available. FINDINGS: Liver: Diffusely coarsened echotexture with increased echogenicity normal hepatic contour without nodularity. No focal lesion, mass or intrahepatic biliary ductal dilatation. Main Portal Vein size: 0.83 cm Portal Vein Velocities Main Prox:  23 cm/sec, antegrade Main Dist:  23 cm/sec, antegrade Right: Not visualized. Left: Not visualized. Hepatic Vein Velocities Right: Not visualized. Middle:  14.5 cm/sec Left: Not visualized. IVC: Present and patent with normal respiratory phasicity. Hepatic Artery Velocity:  216 cm/sec Splenic Vein Velocity: Not visualized. Spleen: 8.8 cm x 8.1 cm x 3.6 cm with a total volume of 134 cm^3 (411 cm^3 is upper limit normal) Portal Vein Occlusion/Thrombus: No Splenic Vein Occlusion/Thrombus: No Ascites: None Varices: None IMPRESSION: 1. Limited evaluation. The visualized portal vasculature is patent with antegrade flow. 2. Diffusely coarsened and echogenic hepatic parenchyma as could be seen with hepatic steatosis or acute hepatitis. 3. No evidence of biliary ductal dilation. Ester Sides, MD [facility]  [facility] Radiology Electronically Signed  By: Ester Sides M.D.   On: 07/08/2024 06:30   CT ABDOMEN PELVIS W CONTRAST Result Date: 07/07/2024 CLINICAL DATA:  Acute abdominal pain. Altered mental status and jaundice. EXAM: CT ABDOMEN AND PELVIS WITH CONTRAST TECHNIQUE: Multidetector CT imaging of the abdomen and pelvis was performed using the standard protocol following bolus administration of intravenous contrast. RADIATION DOSE REDUCTION: This exam was performed according to the departmental dose-optimization program which includes automated exposure control, adjustment of the mA and/or kV according to patient size and/or use of iterative reconstruction technique. CONTRAST:  75mL OMNIPAQUE IOHEXOL 300 MG/ML  SOLN COMPARISON:  None Available. FINDINGS: Lower chest: Bandlike areas of atelectasis within both lower lobes. Trace pleural thickening without significant effusion. Hepatobiliary: Enlarged liver spanning 18.9 cm cranial caudal. Advanced hepatic steatosis. Allowing for motion artifact, no focal liver abnormality. There may be subtle capsular nodularity, although motion obscures assessment. Small gallstone within minimally distended gallbladder. No biliary dilatation. Pancreas: Parenchymal atrophy. No ductal dilatation or inflammation. No evidence of pancreatic mass. Spleen: No splenomegaly.  No focal splenic abnormality. Adrenals/Urinary Tract: No adrenal nodule. No hydronephrosis. Symmetric bilateral perinephric stranding. No renal calculi or suspicious renal abnormality. Absent excretion on delayed phase imaging. Partially distended urinary bladder, mildly thick walled. Stomach/Bowel: Small hiatal hernia. Decompressed stomach. Small bowel is decompressed. Normal appendix is tentatively visualized, regardless no appendicitis. Mild wall thickening about the ascending and hepatic flexure of the colon. Moderate stool within the left colon. No bowel obstruction. Vascular/Lymphatic: Aortic atherosclerosis.  No aneurysm. The portal vein is patent allowing for motion. No abdominopelvic adenopathy. Reproductive: Prostate is unremarkable. Other: Trace free fluid in the pelvis, but no significant ascites. No free air. No abdominal wall hernia. Musculoskeletal: Remote left rib fractures. Chronic hip arthropathy and postsurgical change. No acute osseous findings. IMPRESSION: 1. Hepatomegaly and advanced hepatic steatosis. There may be subtle capsular nodularity, although motion obscures assessment. Recommend correlation with any clinical or laboratory findings of cirrhosis. 2. Mild wall thickening about the ascending and hepatic flexure of the colon, suspicious for colitis. 3. Cholelithiasis without gallbladder inflammation. 4. Absent renal excretion on delayed phase imaging, often seen with renal dysfunction. Aortic Atherosclerosis (ICD10-I70.0). Electronically Signed   By: Andrea Gasman M.D.   On: 07/07/2024 14:19   DG Chest Port 1 View Result Date: 07/07/2024 CLINICAL DATA:  Possible sepsis. Altered mental status and jaundice. Hypothermia. EXAM: PORTABLE CHEST 1 VIEW COMPARISON:  Head FINDINGS: The heart size and mediastinal contours are within normal limits. No consolidation, effusion, or pneumothorax. Old rib fractures are present bilaterally. IMPRESSION: No active disease. Electronically Signed   By: Leita Birmingham M.D.   On: 07/07/2024 14:15   CT Head Wo Contrast Result Date: 07/07/2024 EXAM: CT HEAD WITHOUT CONTRAST 07/07/2024 12:59:28 PM TECHNIQUE: CT of the head was performed without the administration of intravenous contrast. Automated exposure control, iterative reconstruction, and/or weight based adjustment of the mA/kV was utilized to reduce the radiation dose to as low as reasonably achievable. COMPARISON: 01/06/2016 CLINICAL HISTORY: Mental status change, unknown cause. AMS. FINDINGS: BRAIN AND VENTRICLES: No acute hemorrhage. No evidence of acute infarct. No hydrocephalus. No extra-axial collection.  No mass effect or midline shift. Prominence of the sulci and ventricles compatible with brain atrophy. Hypoattenuating foci in the cerebral white matter, most likely representing chronic small vessel disease. ORBITS: No acute abnormality. SINUSES: No acute abnormality. SOFT TISSUES AND SKULL: No acute soft tissue abnormality. No skull fracture. IMPRESSION: 1. No acute intracranial abnormality. Electronically signed by: Birmingham Calk MD 07/07/2024 01:04  PM EDT RP Workstation: [id number]    EKG: ***  ASSESSMENT AND PLAN:  ***  Signed: Cara JONETTA Lovelace MD, PHD, [facility] 07/08/2024, 4:07 PM

## 2024-07-08 NOTE — Progress Notes (Signed)
 Patient progressively more alert throughout shift.   Patient with with minimal urine output. Bladder scanned, . MD aware. New orders implemented.

## 2024-07-08 NOTE — Progress Notes (Signed)
  Echocardiogram 2D Echocardiogram has been performed.  Sergio Curry 07/08/2024, 9:20 AM

## 2024-07-08 NOTE — Consult Note (Signed)
 PHARMACY CONSULT NOTE - ELECTROLYTES  Pharmacy Consult for Electrolyte Monitoring and Replacement   Recent Labs: Height: 5' 9 (175.3 cm) Weight: 58.7 kg (129 lb 6.6 oz) IBW/kg (Calculated) : 70.7 Estimated Creatinine Clearance: 43.6 mL/min (A) (by C-G formula based on SCr of 1.42 mg/dL (H)). Potassium (mmol/L)  Date Value  07/08/2024 2.9 (L)   Magnesium (mg/dL)  Date Value  89/73/7974 2.3   Calcium (mg/dL)  Date Value  89/73/7974 7.4 (L)   Albumin (g/dL)  Date Value  89/73/7974 1.7 (L)   Phosphorus (mg/dL)  Date Value  89/73/7974 3.9   Sodium (mmol/L)  Date Value  07/08/2024 133 (L)   Corrected Ca: 9.24 mg/dL  Assessment  Sergio Curry is a 63 y.o. male presenting with altered mental status and jaundice. PMH significant for seizures and liver disease. Pharmacy has been consulted to monitor and replace electrolytes.  Diet: NPO MIVF: N/A Pertinent medications: N/A  Goal of Therapy: Electrolytes WNL  Plan:  K = 2.9: Kcl 10mEq IV x 4 and Kcl 20mEq PO x 2 ordered by CCNP Check BMP, Mg, Phos with AM labs  Thank you for allowing pharmacy to be a part of this patient's care.  Courtni Balash A Anothy Bufano, PharmD Clinical Pharmacist 07/08/2024 7:14 AM

## 2024-07-08 NOTE — Consult Note (Addendum)
 Inpatient Consultation   Patient ID: Sergio Curry is a 63 y.o. male.  Requesting Provider: Lonell Moose, NP  Date of Admission: 07/07/2024  Date of Consult: 07/08/24   Reason for Consultation: liver failure, anemia, thrombocytopenia   Patient's Chief Complaint:   Chief Complaint  Patient presents with   Altered Mental Status   Jaundice    62 year old Caucasian male with history of alcohol abuse, withdrawal seizures, THC use who presents to the hospital with altered mentation and jaundice.  GI is consulted for liver dysfunction/failure, thrombocytopenia and anemia.  Patient is unable to give any meaningful history, so information is garnered via chart review and discussion with pulmonary critical care team.  In review of chart, patient has a longstanding history of alcohol use.  Patient admits to a few beers a day.  When seen by EMS, he was hypothermic at 94.1 F which has since improved.  Patient was confused but able to answer questions on presentation.  No signs of GI bleeding.  Hemoglobin on presentation was 6.6 and grossly macrocytic.  Platelets were exceptionally low at 6000.  Transaminases were elevated in an alcoholic hepatitis pattern with additional elevation in bilirubin at 8.3. CT was performed with no biliary dilation or signs of obstruction.  No thrombosis appreciated on ultrasound Doppler.  No signs of ascites on imaging.  He did have question of colitis in the ascending colon, nodularity to the liver capsule as well as pancreatic atrophy.  Blood cultures have returned positive for staph epi which may be contaminant.  He is currently on vancomycin and Zosyn.  He did also receive 1 dose of albumin and Protonix.  He has also received 2 units of PRBCs and 2 packs of platelets.  Platelets have since improved to 25,000 and hemoglobin at 7.9.  Overnight he began having withdrawal symptoms.  Lactate has improved to 6.4 from over 9.  Denies NSAIDs, Anti-plt agents,  and anticoagulants Denies family history of gastrointestinal disease and malignancy Previous Endoscopies: none    Past Medical History:  Diagnosis Date   Seizures (HCC)     Surg Hx - L hip surgery per chart review  No Known Allergies  No family history on file.  Social History   Tobacco Use   Smoking status: Former    Types: Cigarettes   Smokeless tobacco: Never  Substance Use Topics   Alcohol use: Yes    Alcohol/week: 7.0 standard drinks of alcohol    Types: 4 Cans of beer, 3 Shots of liquor per week   Drug use: Yes    Types: Marijuana     Pertinent GI related history and allergies were reviewed with the patient  Review of Systems  Unable to perform ROS: Mental status change  Skin:  Positive for color change.  Psychiatric/Behavioral:  Positive for confusion.      Medications Home Medications No current facility-administered medications on file prior to encounter.   Current Outpatient Medications on File Prior to Encounter  Medication Sig Dispense Refill   chlordiazePOXIDE  (LIBRIUM ) 25 MG capsule Take 1 capsule 5 times a day on day 1, then decrease by one capsule daily until gone (Patient not taking: Reported on 07/07/2024) 15 capsule 0   ciprofloxacin -dexamethasone  (CIPRODEX ) OTIC suspension Place 4 drops into the right ear 2 (two) times daily. (Patient not taking: Reported on 07/07/2024) 7.5 mL 0   cloNIDine  (CATAPRES  - DOSED IN MG/24 HR) 0.1 mg/24hr patch Place 1 patch (0.1 mg total) onto the skin every 7 (seven) days. (  Patient not taking: Reported on 07/07/2024) 4 patch 11   Pertinent GI related medications were reviewed with the patient  Inpatient Medications  Current Facility-Administered Medications:    Chlorhexidine Gluconate Cloth 2 % PADS 6 each, 6 each, Topical, Daily, Nelson, Dana G, NP, 6 each at 07/07/24 1528   docusate sodium (COLACE) capsule 100 mg, 100 mg, Oral, BID PRN, Nelson, Dana G, NP   folic acid injection 1 mg, 1 mg, Intravenous, Daily,  Nelson, Dana G, NP, 1 mg at 07/07/24 1527   LORazepam  (ATIVAN ) tablet 1-4 mg, 1-4 mg, Oral, Q1H PRN **OR** LORazepam  (ATIVAN ) injection 1-4 mg, 1-4 mg, Intravenous, Q1H PRN, Nelson, Dana G, NP, 1 mg at 07/07/24 2226   pantoprazole (PROTONIX) injection 40 mg, 40 mg, Intravenous, Q12H, Nelson, Dana G, NP, 40 mg at 07/08/24 0427   piperacillin-tazobactam (ZOSYN) IVPB 3.375 g, 3.375 g, Intravenous, Q8H, Park, Leonor BROCKS, [place], Last Rate: 12.5 mL/hr at 07/08/24 0523, 3.375 g at 07/08/24 0523   polyethylene glycol (MIRALAX / GLYCOLAX) packet 17 g, 17 g, Oral, Daily PRN, Nelson, Dana G, NP   potassium chloride 10 mEq in 100 mL IVPB, 10 mEq, Intravenous, Q1 Hr x 4, Ouma, Almarie Bake, NP, Last Rate: 100 mL/hr at 07/08/24 0728, 10 mEq at 07/08/24 9271   potassium chloride SA (KLOR-CON M) CR tablet 20 mEq, 20 mEq, Oral, BID, [place], Almarie Bake, NP   thiamine (VITAMIN B1) 500 mg in sodium chloride  0.9 % 50 mL IVPB, 500 mg, Intravenous, Daily, Stopped at 07/07/24 1616 **FOLLOWED BY** [START ON 07/11/2024] thiamine (VITAMIN B1) injection 100 mg, 100 mg, Intravenous, Daily, Nelson, Dana G, NP  piperacillin-tazobactam 3.375 g (07/08/24 0523)   potassium chloride 10 mEq (07/08/24 0728)   thiamine (VITAMIN B1) injection Stopped (07/07/24 1616)    docusate sodium, LORazepam  **OR** LORazepam , polyethylene glycol   Objective   Vitals:   07/08/24 0400 07/08/24 0500 07/08/24 0600 07/08/24 0700  BP: (!) 112/31 (!) 108/27 (!) 110/29 (!) 133/32  Pulse: 93 92 89   Resp: (!) 29 (!) 26 (!) 26 17  Temp:      TempSrc:      SpO2: 98% 96% 96%   Weight:  58.7 kg    Height:         Physical Exam Vitals and nursing note reviewed.  Constitutional:      General: He is not in acute distress.    Appearance: He is ill-appearing. He is not toxic-appearing or diaphoretic.  HENT:     Head: Normocephalic and atraumatic.     Nose: Nose normal.     Mouth/Throat:     Mouth: Mucous membranes are moist.     Pharynx:  Oropharynx is clear.  Eyes:     General: Scleral icterus present.     Extraocular Movements: Extraocular movements intact.  Cardiovascular:     Rate and Rhythm: Normal rate and regular rhythm.  Pulmonary:     Effort: Pulmonary effort is normal. No respiratory distress.     Breath sounds: Normal breath sounds. No wheezing, rhonchi or rales.  Abdominal:     General: Bowel sounds are normal. There is no distension.     Palpations: Abdomen is soft.     Tenderness: There is no abdominal tenderness. There is no guarding or rebound.  Musculoskeletal:     Cervical back: Neck supple.  Skin:    General: Skin is warm and dry.     Coloration: Skin is jaundiced. Skin is not pale.  Findings: Bruising (scattered petechiae) present.  Neurological:     Mental Status: He is alert.     Comments: Encephalopathic.  He is able to tell me that he is in the hospital, but does not answer questions to name date of birth or location.  Groans/garbled-unintelligible speech      Laboratory Data Recent Labs  Lab 07/07/24 1210 07/08/24 0426  WBC 15.3* 17.9*  HGB 6.6* 7.9*  HCT 19.8* 22.6*  PLT 6* 25*   Recent Labs  Lab 07/07/24 1210 07/08/24 0426  NA 132* 133*  K 2.9* 2.9*  CL 96* 96*  CO2 18* 23  BUN 37* 40*  CALCIUM 8.4* 7.4*  PROT 6.5 5.4*  BILITOT 8.3* 8.3*  ALKPHOS 163* 118  ALT 30 25  AST 128* 100*  GLUCOSE 79 128*   Recent Labs  Lab 07/07/24 1210  INR 1.7*    Recent Labs    07/07/24 1210  LIPASE 16        Imaging Studies: US  ABDOMEN LIMITED WITH LIVER DOPPLER Result Date: 07/08/2024 CLINICAL DATA:  63 year old male with history of hyperbilirubinemia. EXAM: DUPLEX ULTRASOUND OF LIVER TECHNIQUE: Color and duplex Doppler ultrasound was performed to evaluate the hepatic in-flow and out-flow vessels. COMPARISON:  None Available. FINDINGS: Liver: Diffusely coarsened echotexture with increased echogenicity normal hepatic contour without nodularity. No focal lesion, mass or  intrahepatic biliary ductal dilatation. Main Portal Vein size: 0.83 cm Portal Vein Velocities Main Prox:  23 cm/sec, antegrade Main Dist:  23 cm/sec, antegrade Right: Not visualized. Left: Not visualized. Hepatic Vein Velocities Right: Not visualized. Middle:  14.5 cm/sec Left: Not visualized. IVC: Present and patent with normal respiratory phasicity. Hepatic Artery Velocity:  216 cm/sec Splenic Vein Velocity: Not visualized. Spleen: 8.8 cm x 8.1 cm x 3.6 cm with a total volume of 134 cm^3 (411 cm^3 is upper limit normal) Portal Vein Occlusion/Thrombus: No Splenic Vein Occlusion/Thrombus: No Ascites: None Varices: None IMPRESSION: 1. Limited evaluation. The visualized portal vasculature is patent with antegrade flow. 2. Diffusely coarsened and echogenic hepatic parenchyma as could be seen with hepatic steatosis or acute hepatitis. 3. No evidence of biliary ductal dilation. Ester Sides, MD [facility] Electronically Signed   By: Ester Sides M.D.   On: 07/08/2024 06:30   CT ABDOMEN PELVIS W CONTRAST Result Date: 07/07/2024 CLINICAL DATA:  Acute abdominal pain. Altered mental status and jaundice. EXAM: CT ABDOMEN AND PELVIS WITH CONTRAST TECHNIQUE: Multidetector CT imaging of the abdomen and pelvis was performed using the standard protocol following bolus administration of intravenous contrast. RADIATION DOSE REDUCTION: This exam was performed according to the departmental dose-optimization program which includes automated exposure control, adjustment of the mA and/or kV according to patient size and/or use of iterative reconstruction technique. CONTRAST:  75mL OMNIPAQUE IOHEXOL 300 MG/ML  SOLN COMPARISON:  None Available. FINDINGS: Lower chest: Bandlike areas of atelectasis within both lower lobes. Trace pleural thickening without significant effusion. Hepatobiliary: Enlarged liver spanning 18.9 cm cranial caudal. Advanced hepatic steatosis. Allowing  for motion artifact, no focal liver abnormality. There may be subtle capsular nodularity, although motion obscures assessment. Small gallstone within minimally distended gallbladder. No biliary dilatation. Pancreas: Parenchymal atrophy. No ductal dilatation or inflammation. No evidence of pancreatic mass. Spleen: No splenomegaly.  No focal splenic abnormality. Adrenals/Urinary Tract: No adrenal nodule. No hydronephrosis. Symmetric bilateral perinephric stranding. No renal calculi or suspicious renal abnormality. Absent excretion on delayed phase imaging. Partially distended urinary bladder, mildly thick walled. Stomach/Bowel: Small hiatal hernia.  Decompressed stomach. Small bowel is decompressed. Normal appendix is tentatively visualized, regardless no appendicitis. Mild wall thickening about the ascending and hepatic flexure of the colon. Moderate stool within the left colon. No bowel obstruction. Vascular/Lymphatic: Aortic atherosclerosis. No aneurysm. The portal vein is patent allowing for motion. No abdominopelvic adenopathy. Reproductive: Prostate is unremarkable. Other: Trace free fluid in the pelvis, but no significant ascites. No free air. No abdominal wall hernia. Musculoskeletal: Remote left rib fractures. Chronic hip arthropathy and postsurgical change. No acute osseous findings. IMPRESSION: 1. Hepatomegaly and advanced hepatic steatosis. There may be subtle capsular nodularity, although motion obscures assessment. Recommend correlation with any clinical or laboratory findings of cirrhosis. 2. Mild wall thickening about the ascending and hepatic flexure of the colon, suspicious for colitis. 3. Cholelithiasis without gallbladder inflammation. 4. Absent renal excretion on delayed phase imaging, often seen with renal dysfunction. Aortic Atherosclerosis (ICD10-I70.0). Electronically Signed   By: Andrea Gasman M.D.   On: 07/07/2024 14:19   DG Chest Port 1 View Result Date: 07/07/2024 CLINICAL DATA:   Possible sepsis. Altered mental status and jaundice. Hypothermia. EXAM: PORTABLE CHEST 1 VIEW COMPARISON:  Head FINDINGS: The heart size and mediastinal contours are within normal limits. No consolidation, effusion, or pneumothorax. Old rib fractures are present bilaterally. IMPRESSION: No active disease. Electronically Signed   By: Leita Birmingham M.D.   On: 07/07/2024 14:15   CT Head Wo Contrast Result Date: 07/07/2024 EXAM: CT HEAD WITHOUT CONTRAST 07/07/2024 12:59:28 PM TECHNIQUE: CT of the head was performed without the administration of intravenous contrast. Automated exposure control, iterative reconstruction, and/or weight based adjustment of the mA/kV was utilized to reduce the radiation dose to as low as reasonably achievable. COMPARISON: 01/06/2016 CLINICAL HISTORY: Mental status change, unknown cause. AMS. FINDINGS: BRAIN AND VENTRICLES: No acute hemorrhage. No evidence of acute infarct. No hydrocephalus. No extra-axial collection. No mass effect or midline shift. Prominence of the sulci and ventricles compatible with brain atrophy. Hypoattenuating foci in the cerebral white matter, most likely representing chronic small vessel disease. ORBITS: No acute abnormality. SINUSES: No acute abnormality. SOFT TISSUES AND SKULL: No acute soft tissue abnormality. No skull fracture. IMPRESSION: 1. No acute intracranial abnormality. Electronically signed by: Birmingham Calk MD 07/07/2024 01:04 PM EDT RP Workstation: [id number]    Assessment:   # acute on chronic liver injury with transaminitis in etoh hepatitis pattern - pt c preexisting liver disease- long standing etoh hx with withdrawal sz in the past - ct raises concern for cirrhosis (previously undiagnosed)- suspected to have given lab findings - INR 1.7 on presentation; AST 128 ALT 25 TB 8.3 - viral hepatitis pending - MELD 3.0 on presentation 29  # Acute encephalopathy  # EtOH Hepatitis - Mdf 42  # EtOH abuse and withdrawal  #  Pancytopenia - Macrocytic anemia- acute on chronic- suspect bone marrow suppression c etoh use (MCV 111) with platelets that low on presentation, high risk for spontaneously bleeding/oozing although no signs GIB at this time - Thrombocytopenia- plt 6000 on presentation  - b12 and folate wnl  S/p 2 u prbc and 2 pack plt  # AKI  # Leukcytosis - blood cx with staph epi (may be contaminant) - colitis in ac on ct - cxr w/o pna - Lactate elevated >9 on presentation- improving  # chronic pancreatitis - imaging supportive of pancreatic atrophy  # lactic acidosis  # Colitis - demonstrated on CT scan in ascending colon and HF, no demonstrable diarrhea- may be sequelae of portal colopathy or  acute hepatitis given vicinity  # multiple electrolyte derangements - hypo na, hypok, hypocl  #Hypoalbuminemia  Plan:   Recommend initiating lactulose Pt having withdrawal sx. CIWA protocol and sedation as per PCCM team  Initiate Thiamine Monitor cbc, cmp, inr daily Currently npo due to lethargy and encephalopathy   Monitor renal fxn. Consider Urine Na If UOP still low and Cr rising (currently downtrending) consider albumin challenge - 100 g of 25% day 1 and 2 Could also start octreotide bolus and infusion should this occur Given his creatinine improving, will hold off for now  Monitor H&H.  Transfusion and resuscitation as per primary team ABX per primary team - no ascites on imaging - blood cx with staph epi- ? Contaminant - cxr w/o pna  Transfused 2 u prbc and 2 packs plt  Although Maddrey Df is elevated, given lactic acidosis and leukocytosis with possible infection of unknown etiology, would forgo steroids at this juncture. Liver enzymes have also improved overnight w/o intervention- however I expect these to fluctuate  Avoid frequent lab draws to prevent lab induced anemia Supportive care and antiemetics as per primary team Maintain two sites IV access Avoid nsaids Monitor for  GIB.  No signs of gib at this time. Suspect bone marrow suppression causing chronic anemia with later oozing given platelets were <10K contributing to macrocytic anemia picture.  No urgent/emergent endoscopic procedure planned at this time. Plt would need to be >50K prior to endoscopic evaluation as well  D/w PCCM team  Management of other medical comorbidities as per primary team  I personally performed the service.  Thank you for allowing us  to participate in this patient's care. Please don't hesitate to call if any questions or concerns arise.   Elspeth Ozell Jungling, DO [facility]  Portions of the record may have been created with voice recognition software. Occasional wrong-word or 'sound-a-like' substitutions may have occurred due to the inherent limitations of voice recognition software.  Read the chart carefully and recognize, using context, where substitutions may have occurred.

## 2024-07-09 ENCOUNTER — Inpatient Hospital Stay (HOSPITAL_COMMUNITY): Payer: MEDICAID

## 2024-07-09 ENCOUNTER — Inpatient Hospital Stay: Payer: MEDICAID

## 2024-07-09 DIAGNOSIS — G9341 Metabolic encephalopathy: Secondary | ICD-10-CM

## 2024-07-09 DIAGNOSIS — I351 Nonrheumatic aortic (valve) insufficiency: Secondary | ICD-10-CM

## 2024-07-09 DIAGNOSIS — D649 Anemia, unspecified: Secondary | ICD-10-CM

## 2024-07-09 DIAGNOSIS — A498 Other bacterial infections of unspecified site: Secondary | ICD-10-CM

## 2024-07-09 DIAGNOSIS — E46 Unspecified protein-calorie malnutrition: Secondary | ICD-10-CM

## 2024-07-09 DIAGNOSIS — R4182 Altered mental status, unspecified: Secondary | ICD-10-CM

## 2024-07-09 DIAGNOSIS — K7030 Alcoholic cirrhosis of liver without ascites: Secondary | ICD-10-CM

## 2024-07-09 DIAGNOSIS — D509 Iron deficiency anemia, unspecified: Secondary | ICD-10-CM

## 2024-07-09 DIAGNOSIS — D696 Thrombocytopenia, unspecified: Secondary | ICD-10-CM

## 2024-07-09 DIAGNOSIS — K7010 Alcoholic hepatitis without ascites: Secondary | ICD-10-CM

## 2024-07-09 LAB — COMPREHENSIVE METABOLIC PANEL WITH GFR
ALT: 27 U/L (ref 0–44)
AST: 103 U/L — ABNORMAL HIGH (ref 15–41)
Albumin: 1.9 g/dL — ABNORMAL LOW (ref 3.5–5.0)
Alkaline Phosphatase: 132 U/L — ABNORMAL HIGH (ref 38–126)
Anion gap: 12 (ref 5–15)
BUN: 55 mg/dL — ABNORMAL HIGH (ref 8–23)
CO2: 22 mmol/L (ref 22–32)
Calcium: 7.9 mg/dL — ABNORMAL LOW (ref 8.9–10.3)
Chloride: 100 mmol/L (ref 98–111)
Creatinine, Ser: 1.77 mg/dL — ABNORMAL HIGH (ref 0.61–1.24)
GFR, Estimated: 42 mL/min — ABNORMAL LOW (ref >60)
Glucose, Bld: 105 mg/dL — ABNORMAL HIGH (ref 70–99)
Potassium: 3.1 mmol/L — ABNORMAL LOW (ref 3.5–5.1)
Sodium: 134 mmol/L — ABNORMAL LOW (ref 135–145)
Total Bilirubin: 11 mg/dL — ABNORMAL HIGH (ref 0.0–1.2)
Total Protein: 6.3 g/dL — ABNORMAL LOW (ref 6.5–8.1)

## 2024-07-09 LAB — GLUCOSE, CAPILLARY
Glucose-Capillary: 107 mg/dL — ABNORMAL HIGH (ref 70–99)
Glucose-Capillary: 93 mg/dL (ref 70–99)
Glucose-Capillary: 99 mg/dL (ref 70–99)
Glucose-Capillary: 103 mg/dL — ABNORMAL HIGH (ref 70–99)
Glucose-Capillary: 95 mg/dL (ref 70–99)
Glucose-Capillary: 100 mg/dL — ABNORMAL HIGH (ref 70–99)

## 2024-07-09 LAB — MAGNESIUM: Magnesium: 2.6 mg/dL — ABNORMAL HIGH (ref 1.7–2.4)

## 2024-07-09 LAB — CBC WITH DIFFERENTIAL/PLATELET
Abs Immature Granulocytes: 0.57 K/uL — ABNORMAL HIGH (ref 0.00–0.07)
Basophils Absolute: 0.1 K/uL (ref 0.0–0.1)
Basophils Relative: 0 %
Eosinophils Absolute: 0 K/uL (ref 0.0–0.5)
Eosinophils Relative: 0 %
HCT: 24.6 % — ABNORMAL LOW (ref 39.0–52.0)
Hemoglobin: 8.3 g/dL — ABNORMAL LOW (ref 13.0–17.0)
Immature Granulocytes: 3 %
Lymphocytes Relative: 10 %
Lymphs Abs: 1.8 K/uL (ref 0.7–4.0)
MCH: 33.3 pg (ref 26.0–34.0)
MCHC: 33.7 g/dL (ref 30.0–36.0)
MCV: 98.8 fL (ref 80.0–100.0)
Monocytes Absolute: 2 K/uL — ABNORMAL HIGH (ref 0.1–1.0)
Monocytes Relative: 11 %
Neutro Abs: 13.4 K/uL — ABNORMAL HIGH (ref 1.7–7.7)
Neutrophils Relative %: 76 %
Platelets: 13 K/uL — CL (ref 150–400)
RBC: 2.49 MIL/uL — ABNORMAL LOW (ref 4.22–5.81)
RDW: 21 % — ABNORMAL HIGH (ref 11.5–15.5)
Smear Review: NORMAL
WBC: 17.8 K/uL — ABNORMAL HIGH (ref 4.0–10.5)
nRBC: 0.2 % (ref 0.0–0.2)

## 2024-07-09 LAB — TECHNOLOGIST SMEAR REVIEW: Plt Morphology: NORMAL

## 2024-07-09 LAB — BPAM RBC
Blood Product Expiration Date: 202511132359
Blood Product Expiration Date: 202511222359
ISSUE DATE / TIME: 202510252111
ISSUE DATE / TIME: 202510260014
Unit Type and Rh: 202511132359
Unit Type and Rh: 5100
Unit Type and Rh: 6200

## 2024-07-09 LAB — TYPE AND SCREEN
ABO/RH(D): A POS
Antibody Screen: NEGATIVE
Unit division: 0
Unit division: 0

## 2024-07-09 LAB — LACTATE DEHYDROGENASE: LDH: 291 U/L — ABNORMAL HIGH (ref 98–192)

## 2024-07-09 LAB — CBC
HCT: 26 % — ABNORMAL LOW (ref 39.0–52.0)
Hemoglobin: 8.9 g/dL — ABNORMAL LOW (ref 13.0–17.0)
MCH: 33.6 pg (ref 26.0–34.0)
MCHC: 34.2 g/dL (ref 30.0–36.0)
MCV: 98.1 fL (ref 80.0–100.0)
Platelets: 12 K/uL — CL (ref 150–400)
RBC: 2.65 MIL/uL — ABNORMAL LOW (ref 4.22–5.81)
RDW: 21.2 % — ABNORMAL HIGH (ref 11.5–15.5)
WBC: 18.3 K/uL — ABNORMAL HIGH (ref 4.0–10.5)
nRBC: 0.3 % — ABNORMAL HIGH (ref 0.0–0.2)

## 2024-07-09 LAB — BRAIN NATRIURETIC PEPTIDE: B Natriuretic Peptide: 2360.8 pg/mL — ABNORMAL HIGH (ref 0.0–100.0)

## 2024-07-09 LAB — FIBRINOGEN: Fibrinogen: 195 mg/dL — ABNORMAL LOW (ref 210–475)

## 2024-07-09 LAB — PROTIME-INR
INR: 1.5 — ABNORMAL HIGH (ref 0.8–1.2)
Prothrombin Time: 18.5 s — ABNORMAL HIGH (ref 11.4–15.2)

## 2024-07-09 LAB — BLOOD GAS, ARTERIAL

## 2024-07-09 LAB — BILIRUBIN, DIRECT: Bilirubin, Direct: 6 mg/dL — ABNORMAL HIGH (ref 0.0–0.2)

## 2024-07-09 LAB — D-DIMER, QUANTITATIVE: D-Dimer, Quant: 2.68 ug{FEU}/mL — ABNORMAL HIGH (ref 0.00–0.50)

## 2024-07-09 LAB — PHOSPHORUS: Phosphorus: 3.1 mg/dL (ref 2.5–4.6)

## 2024-07-09 MED ORDER — LACTATED RINGERS IV BOLUS
500.0000 mL | Freq: Once | INTRAVENOUS | Status: AC
  Administered 2024-07-09: 500 mL via INTRAVENOUS

## 2024-07-09 MED ORDER — PHENOBARBITAL SODIUM 65 MG/ML IJ SOLN
32.5000 mg | Freq: Three times a day (TID) | INTRAMUSCULAR | Status: DC
Start: 2024-07-13 — End: 2024-07-15

## 2024-07-09 MED ORDER — POTASSIUM CHLORIDE 10 MEQ/100ML IV SOLN
10.0000 meq | INTRAVENOUS | Status: AC
  Administered 2024-07-09 (×3): 10 meq via INTRAVENOUS
  Filled 2024-07-09 (×3): qty 100

## 2024-07-09 MED ORDER — PHENOBARBITAL SODIUM 65 MG/ML IJ SOLN
65.0000 mg | Freq: Three times a day (TID) | INTRAMUSCULAR | Status: DC
  Administered 2024-07-11 – 2024-07-12 (×3): 65 mg via INTRAVENOUS
  Filled 2024-07-09 (×3): qty 1

## 2024-07-09 MED ORDER — LORAZEPAM 2 MG/ML IJ SOLN: 1.0000 mg | INTRAMUSCULAR | Status: DC | PRN

## 2024-07-09 MED ORDER — PHENOBARBITAL SODIUM 130 MG/ML IJ SOLN
97.5000 mg | Freq: Three times a day (TID) | INTRAMUSCULAR | Status: AC
  Administered 2024-07-09 – 2024-07-11 (×6): 97.5 mg via INTRAVENOUS
  Filled 2024-07-09 (×6): qty 1

## 2024-07-09 MED ORDER — POTASSIUM CHLORIDE CRYS ER 20 MEQ PO TBCR: 40.0000 meq | EXTENDED_RELEASE_TABLET | Freq: Once | ORAL | Status: DC

## 2024-07-09 MED ORDER — GADOBUTROL 1 MMOL/ML IV SOLN
8.0000 mL | Freq: Once | INTRAVENOUS | Status: AC | PRN
Start: 2024-07-09 — End: 2024-07-09
  Administered 2024-07-09: 8 mL via INTRAVENOUS

## 2024-07-09 MED ORDER — FUROSEMIDE 10 MG/ML IJ SOLN
40.0000 mg | Freq: Once | INTRAMUSCULAR | Status: AC
Start: 2024-07-09 — End: 2024-07-09
  Administered 2024-07-09: 40 mg via INTRAVENOUS
  Filled 2024-07-09: qty 4

## 2024-07-09 NOTE — Plan of Care (Signed)
  Problem: Pain Managment: Goal: General experience of comfort will improve and/or be controlled Outcome: Progressing   Problem: Safety: Goal: Ability to remain free from injury will improve Outcome: Progressing   Problem: Skin Integrity: Goal: Risk for impaired skin integrity will decrease Outcome: Progressing

## 2024-07-09 NOTE — Consult Note (Signed)
 Hematology/Oncology Consult note Sergio [facility] Telephone:([phone] [phone]  Patient Care Team: Patient, No Pcp Per as PCP - General (General Practice)   Name of the patient: Sergio Curry  [id number]  April 11, 1961    Reason for consult: Thrombocytopenia   Requesting physician: Dr. 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  He was admitted to the hospital on 07/07/2024 with symptoms of altered mental status and jaundice.  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  Patient noted to have white count of 15.3, H&H of 6.6/19.8 with an MCV of 111 and a platelet count of 6 on admission 2 days ago.  Presently platelet counts of 13.  Fibrinogen levels low at 195 with an elevated D-dimer of 2.6.  LDH elevated at 291.  ECOG PS- ***  Pain scale- ***   Review of systems- ROS  No Known Allergies  Patient Active Problem List   Diagnosis Date Noted   Acute metabolic encephalopathy 07/07/2024     Past Medical History:  Diagnosis Date   Seizures (HCC)      No past surgical history on file.  Social History   Socioeconomic History   Marital status: Single    Spouse name: Not on file   Number of children: Not on file   Years of education: Not on file   Highest education level: Not on file  Occupational History   Not on file  Tobacco Use   Smoking status: Former    Types: Cigarettes   Smokeless tobacco: Never  Substance and Sexual Activity   Alcohol use: Yes    Alcohol/week: 7.0 standard drinks of alcohol    Types: 4 Cans of beer, 3 Shots of liquor per week   Drug use: Yes    Types: Marijuana   Sexual activity: Not Currently    Birth control/protection: None  Other Topics Concern   Not on file  Social  History Narrative   Not on file   Social Drivers of Health   Financial Resource Strain: Not on file  Food Insecurity: No Food Insecurity (07/07/2024)   Hunger Vital Sign    Worried About Running Out of Food in the Last Year: Never true    Ran Out of Food in the Last Year: Never true  Transportation Needs: No Transportation Needs (07/07/2024)   PRAPARE - Administrator, Civil Service (Medical): No    Lack of Transportation (Non-Medical): No  Physical Activity: Not on file  Stress: Not on file  Social Connections: Not on file  Intimate Partner Violence: Not At Risk (07/07/2024)   Humiliation, Afraid, Rape, and Kick questionnaire    Fear of Current or Ex-Partner: No    Emotionally Abused: No    Physically Abused: No    Sexually Abused: No     No family history on file.   Current Facility-Administered Medications:    Chlorhexidine Gluconate Cloth 2 % PADS 6 each, 6 each, Topical, Daily, Nelson, Dana G, NP, 6 each at 07/09/24 9060   docusate sodium (COLACE) capsule 100 mg, 100 mg, Oral, BID PRN, Nelson, Dana G, NP   folic acid injection 1 mg, 1 mg, Intravenous, Daily, Nelson, Dana G, NP, 1 mg at 07/09/24 0935   lactulose (CHRONULAC) enema 200 gm, 300 mL, Rectal, BID, Maranda,  Lonell MATSU, NP, 300 mL at 07/09/24 0858   LORazepam  (ATIVAN ) tablet 1-4 mg, 1-4 mg, Oral, Q1H PRN **OR** LORazepam  (ATIVAN ) injection 1-4 mg, 1-4 mg, Intravenous, Q1H PRN, Nelson, Dana G, NP, 1 mg at 07/09/24 9185   Oral care mouth rinse, 15 mL, Mouth Rinse, PRN, Kasa, Kurian, MD   pantoprazole (PROTONIX) injection 40 mg, 40 mg, Intravenous, Q12H, Nelson, Dana G, NP, 40 mg at 07/09/24 1559   PHENObarbital (LUMINAL) injection 97.5 mg, 97.5 mg, Intravenous, Q8H, 97.5 mg at 07/09/24 1505 **FOLLOWED BY** [START ON 07/11/2024] PHENObarbital (LUMINAL) injection 65 mg, 65 mg, Intravenous, Q8H **FOLLOWED BY** [START ON 07/13/2024] PHENObarbital (LUMINAL) injection 32.5 mg, 32.5 mg, Intravenous, Q8H, Assaker,  Jean-Pierre, MD   piperacillin-tazobactam (ZOSYN) IVPB 3.375 g, 3.375 g, Intravenous, Q8H, Park, Leonor BROCKS, [place], Last Rate: 12.5 mL/hr at 07/09/24 1601, 3.375 g at 07/09/24 1601   polyethylene glycol (MIRALAX / GLYCOLAX) packet 17 g, 17 g, Oral, Daily PRN, Nelson, Dana G, NP   thiamine (VITAMIN B1) 500 mg in sodium chloride  0.9 % 50 mL IVPB, 500 mg, Intravenous, Daily, Stopped at 07/09/24 1042 **FOLLOWED BY** [START ON 07/11/2024] thiamine (VITAMIN B1) injection 100 mg, 100 mg, Intravenous, Daily, Maranda Lonell MATSU, NP   Physical exam:  Vitals:   07/09/24 1557 07/09/24 1558 07/09/24 1559 07/09/24 1600  BP:      Pulse: 90     Resp: 16 (!) 21 (!) 37 (!) 32  Temp:    97.6 F (36.4 C)  TempSrc:    Axillary  SpO2: 95%     Weight:      Height:       Physical Exam        Latest Ref Rng & Units 07/09/2024    5:55 AM  CMP  Glucose 70 - 99 mg/dL 894   BUN 8 - 23 mg/dL 55   Creatinine 9.38 - 1.24 mg/dL 8.22   Sodium 864 - 854 mmol/L 134   Potassium 3.5 - 5.1 mmol/L 3.1   Chloride 98 - 111 mmol/L 100   CO2 22 - 32 mmol/L 22   Calcium 8.9 - 10.3 mg/dL 7.9   Total Protein 6.5 - 8.1 g/dL 6.3   Total Bilirubin 0.0 - 1.2 mg/dL 88.9   Alkaline Phos 38 - 126 U/L 132   AST 15 - 41 U/L 103   ALT 0 - 44 U/L 27       Latest Ref Rng & Units 07/09/2024   10:52 AM  CBC  WBC 4.0 - 10.5 K/uL 17.8   Hemoglobin 13.0 - 17.0 g/dL 8.3   Hematocrit 60.9 - 52.0 % 24.6   Platelets 150 - 400 K/uL 13     @IMAGES @  ECHOCARDIOGRAM COMPLETE Result Date: 07/08/2024    ECHOCARDIOGRAM REPORT   Patient Name:   Sergio Curry Date of Exam: 07/08/2024 Medical Rec #:  [id number]        Height:       69.0 in Accession #:    [id number]       Weight:       129.4 lb Date of Birth:  01-26-1961         BSA:          1.717 m Patient Age:    63 years         BP:           133/32 mmHg Patient Gender: M  HR:           84 bpm. Exam Location:  [facility] Procedure: 2D Echo, Cardiac Doppler and Color Doppler (Both  Spectral and Color            Flow Doppler were utilized during procedure). Indications:     Elevated Troponin  History:         Patient has no prior history of Echocardiogram examinations.  Sonographer:     Thedora Louder RDCS, FASE Referring Phys:  [id number] LONELL KANDICE MOOSE Diagnosing Phys: Cara JONETTA Lovelace MD  Sonographer Comments: Technically difficult study due to poor echo windows. Image acquisition challenging due to patient behavioral factors. IMPRESSIONS  1. Possible SBE of anterior leaflet. Recommend TEE.  2. Technically difficult study.  3. Left ventricular ejection fraction, by estimation, is 45 to 50%. The left ventricle has mildly decreased function. The left ventricle demonstrates global hypokinesis. The left ventricular internal cavity size was moderately to severely dilated. Left ventricular diastolic function could not be evaluated.  4. Right ventricular systolic function is normal. The right ventricular size is normal.  5. Left atrial size was mildly dilated.  6. The mitral valve is myxomatous. Trivial mitral valve regurgitation.  7. 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 SBE can not be excluded. Consider TEE.. The aortic valve is calcified. Aortic valve regurgitation is mild to moderate. Aortic valve sclerosis/calcification is present, without any evidence of aortic stenosis. Conclusion(s)/Recommendation(s): Poor windows for evaluation of left ventricular function by transthoracic echocardiography. Would recommend an alternative means of evaluation. Findings concerning for aortic valve vegetation, would recommend a Transesophageal Echocardiogram for clarification. FINDINGS  Left Ventricle: Left ventricular ejection fraction, by estimation, is 45 to 50%. The left ventricle has mildly decreased function. The left ventricle demonstrates global hypokinesis. Strain was performed and the global longitudinal strain is indeterminate. The left ventricular internal cavity size  was moderately to severely dilated. There is no left ventricular hypertrophy. Left ventricular diastolic function could not be evaluated. Right Ventricle: The right ventricular size is normal. No increase in right ventricular wall thickness. Right ventricular systolic function is normal. Left Atrium: Left atrial size was mildly dilated. Right Atrium: Right atrial size was normal in size. Pericardium: There is no evidence of pericardial effusion. Mitral Valve: The mitral valve is myxomatous. Trivial mitral valve regurgitation. Tricuspid Valve: The tricuspid valve is normal in structure. Tricuspid valve regurgitation is trivial. Aortic Valve: Mobile mass on anterior leaflet prolapsing inyo the outflow tract. SBE can not be excluded. Consider TEE. The aortic valve is calcified. Aortic valve regurgitation is mild to moderate. Aortic valve sclerosis/calcification is present, without any evidence of aortic stenosis. Aortic valve peak gradient measures 8.2 mmHg. Pulmonic Valve: The pulmonic valve was normal in structure. Pulmonic valve regurgitation is not visualized. Aorta: The ascending aorta was not well visualized. IAS/Shunts: No atrial level shunt detected by color flow Doppler. Additional Comments: Possible SBE of anterior leaflet. Recommend TEE. Technically difficult study. 3D was performed not requiring image post processing on an independent workstation and was indeterminate.  LEFT VENTRICLE PLAX 2D LVIDd:         6.10 cm LVIDs:         4.70 cm LV PW:         1.10 cm LV IVS:        1.00 cm  LV Volumes (MOD) LV vol d, MOD A4C: 90.4 ml LV vol s, MOD A4C: 29.6 ml LV SV MOD A4C:     90.4  ml LEFT ATRIUM           Index LA diam:      4.10 cm 2.39 cm/m LA Vol (A4C): 26.0 ml 15.14 ml/m  AORTIC VALVE              PULMONIC VALVE AV Vmax:      143.00 cm/s RVOT Peak grad: 1 mmHg AV Peak Grad: 8.2 mmHg LVOT Vmax:    91.80 cm/s LVOT Vmean:   62.000 cm/s LVOT VTI:     0.196 m  AORTA Ao Asc diam: 3.10 cm  SHUNTS Systemic VTI:  0.20 m Cara JONETTA Lovelace MD Electronically signed by Cara JONETTA Lovelace MD Signature Date/Time: 07/08/2024/11:40:39 AM    Final    US  ABDOMEN LIMITED WITH LIVER DOPPLER Result Date: 07/08/2024 CLINICAL DATA:  63 year old male with history of hyperbilirubinemia. EXAM: DUPLEX ULTRASOUND OF LIVER TECHNIQUE: Color and duplex Doppler ultrasound was performed to evaluate the hepatic in-flow and out-flow vessels. COMPARISON:  None Available. FINDINGS: Liver: Diffusely coarsened echotexture with increased echogenicity normal hepatic contour without nodularity. No focal lesion, mass or intrahepatic biliary ductal dilatation. Main Portal Vein size: 0.83 cm Portal Vein Velocities Main Prox:  23 cm/sec, antegrade Main Dist:  23 cm/sec, antegrade Right: Not visualized. Left: Not visualized. Hepatic Vein Velocities Right: Not visualized. Middle:  14.5 cm/sec Left: Not visualized. IVC: Present and patent with normal respiratory phasicity. Hepatic Artery Velocity:  216 cm/sec Splenic Vein Velocity: Not visualized. Spleen: 8.8 cm x 8.1 cm x 3.6 cm with a total volume of 134 cm^3 (411 cm^3 is upper limit normal) Portal Vein Occlusion/Thrombus: No Splenic Vein Occlusion/Thrombus: No Ascites: None Varices: None IMPRESSION: 1. Limited evaluation. The visualized portal vasculature is patent with antegrade flow. 2. Diffusely coarsened and echogenic hepatic parenchyma as could be seen with hepatic steatosis or acute hepatitis. 3. No evidence of biliary ductal dilation. Ester Sides, MD [facility] Electronically Signed   By: Ester Sides M.D.   On: 07/08/2024 06:30   CT ABDOMEN PELVIS W CONTRAST Result Date: 07/07/2024 CLINICAL DATA:  Acute abdominal pain. Altered mental status and jaundice. EXAM: CT ABDOMEN AND PELVIS WITH CONTRAST TECHNIQUE: Multidetector CT imaging of the abdomen and pelvis was performed using the standard protocol following bolus administration of  intravenous contrast. RADIATION DOSE REDUCTION: This exam was performed according to the departmental dose-optimization program which includes automated exposure control, adjustment of the mA and/or kV according to patient size and/or use of iterative reconstruction technique. CONTRAST:  75mL OMNIPAQUE IOHEXOL 300 MG/ML  SOLN COMPARISON:  None Available. FINDINGS: Lower chest: Bandlike areas of atelectasis within both lower lobes. Trace pleural thickening without significant effusion. Hepatobiliary: Enlarged liver spanning 18.9 cm cranial caudal. Advanced hepatic steatosis. Allowing for motion artifact, no focal liver abnormality. There may be subtle capsular nodularity, although motion obscures assessment. Small gallstone within minimally distended gallbladder. No biliary dilatation. Pancreas: Parenchymal atrophy. No ductal dilatation or inflammation. No evidence of pancreatic mass. Spleen: No splenomegaly.  No focal splenic abnormality. Adrenals/Urinary Tract: No adrenal nodule. No hydronephrosis. Symmetric bilateral perinephric stranding. No renal calculi or suspicious renal abnormality. Absent excretion on delayed phase imaging. Partially distended urinary bladder, mildly thick walled. Stomach/Bowel: Small hiatal hernia. Decompressed stomach. Small bowel is decompressed. Normal appendix is tentatively visualized, regardless no appendicitis. Mild wall thickening about the ascending and hepatic flexure of the colon. Moderate stool within the left colon. No bowel obstruction. Vascular/Lymphatic: Aortic atherosclerosis. No aneurysm. The portal vein is patent allowing for  motion. No abdominopelvic adenopathy. Reproductive: Prostate is unremarkable. Other: Trace free fluid in the pelvis, but no significant ascites. No free air. No abdominal wall hernia. Musculoskeletal: Remote left rib fractures. Chronic hip arthropathy and postsurgical change. No acute osseous findings. IMPRESSION: 1. Hepatomegaly and advanced  hepatic steatosis. There may be subtle capsular nodularity, although motion obscures assessment. Recommend correlation with any clinical or laboratory findings of cirrhosis. 2. Mild wall thickening about the ascending and hepatic flexure of the colon, suspicious for colitis. 3. Cholelithiasis without gallbladder inflammation. 4. Absent renal excretion on delayed phase imaging, often seen with renal dysfunction. Aortic Atherosclerosis (ICD10-I70.0). Electronically Signed   By: Andrea Gasman M.D.   On: 07/07/2024 14:19   DG Chest Port 1 View Result Date: 07/07/2024 CLINICAL DATA:  Possible sepsis. Altered mental status and jaundice. Hypothermia. EXAM: PORTABLE CHEST 1 VIEW COMPARISON:  Head FINDINGS: The heart size and mediastinal contours are within normal limits. No consolidation, effusion, or pneumothorax. Old rib fractures are present bilaterally. IMPRESSION: No active disease. Electronically Signed   By: Leita Birmingham M.D.   On: 07/07/2024 14:15   CT Head Wo Contrast Result Date: 07/07/2024 EXAM: CT HEAD WITHOUT CONTRAST 07/07/2024 12:59:28 PM TECHNIQUE: CT of the head was performed without the administration of intravenous contrast. Automated exposure control, iterative reconstruction, and/or weight based adjustment of the mA/kV was utilized to reduce the radiation dose to as low as reasonably achievable. COMPARISON: 01/06/2016 CLINICAL HISTORY: Mental status change, unknown cause. AMS. FINDINGS: BRAIN AND VENTRICLES: No acute hemorrhage. No evidence of acute infarct. No hydrocephalus. No extra-axial collection. No mass effect or midline shift. Prominence of the sulci and ventricles compatible with brain atrophy. Hypoattenuating foci in the cerebral white matter, most likely representing chronic small vessel disease. ORBITS: No acute abnormality. SINUSES: No acute abnormality. SOFT TISSUES AND SKULL: No acute soft tissue abnormality. No skull fracture. IMPRESSION: 1. No acute intracranial abnormality.  Electronically signed by: Birmingham Calk MD 07/07/2024 01:04 PM EDT RP Workstation: [id number]    Assessment and plan- Patient is a 63 y.o. male with history of alcohol-related cirrhosis admitted for acute encephalopathy also found to have staph epidermis bacteremia and subacute bacterial endocarditis.  Hematology consulted for thrombocytopenia and anemia  Thrombocytopenia: Last platelet count I have outside of this admission is from 5 years ago when his platelet counts were 90.  It is unclear as to what his more recent baseline is.  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  Other possible causes of thrombocytopenia include acute alcohol related myelosuppression and ongoing sepsis.  I am inclined to hold off on bone marrow biopsy at this time.  If cytopenias do not improve despite improvement in his underlying clinical condition and sepsis we could consider doing a bone marrow biopsy at that time  Transfuse platelets when less than 10,000.  Monitor fibrinogen 2-3 times a week. If the plasma fibrinogen level is <100 mg/dL, administer cryoprecipitate to increase it to >100 mg/dL. If the plasma fibrinogen level is >100 mg/dL and the PT or aPTT remains significantly elevated, give FFP if there is concern for bleeding.  Anemia:  B12 and folate within normal limits.  Iron studies would not be interpretable since patient received blood transfusion likely secondary to acute ongoing issues.  Continue to monitor and consider supportive transfusions if hemoglobin less than 7   Thank you for this kind referral  and the opportunity to participate in the care of this patient   Visit Diagnosis 1. Sepsis with encephalopathy, due to unspecified organism, unspecified whether septic shock present (HCC)   2. Liver disease   3. Thrombocytopenia   4. Anemia,  unspecified type   5. Altered mental status, unspecified altered mental status type     Dr. Annah Skene, MD, MPH [facility] [phone] 07/09/2024

## 2024-07-09 NOTE — Plan of Care (Signed)

## 2024-07-09 NOTE — Progress Notes (Signed)
 Sergio JONELLE Brooklyn, MD [address]  [place], [place] [address]  Main: [phone] Fax:  [phone] Pager: [phone]   Subjective: Patient is intubated overnight due to respiratory failure from flash pulmonary edema.   Objective: Vital signs in last 24 hours: Vitals:   07/10/24 1235 07/10/24 1240 07/10/24 1245 07/10/24 1248  BP: (!) 113/36 (!) 106/33 (!) 118/39 (!) 113/37  Pulse: 90 88 88 89  Resp: 20 20 20 20   Temp:    98.6 F (37 C)  TempSrc:    Oral  SpO2: 95% 95% 95% 95%  Weight:      Height:       Weight change: 6.3 kg  Intake/Output Summary (Last 24 hours) at 07/10/2024 1441 Last data filed at 07/10/2024 1424 Gross per 24 hour  Intake 3058.58 ml  Output 1246 ml  Net 1812.58 ml     Exam: Heart:: Bradycardia Lungs: bibasilar rales Abdomen: soft, nontender, normal bowel sounds   Lab Results:    Latest Ref Rng & Units 07/10/2024    3:13 AM 07/09/2024   10:52 AM 07/09/2024    5:55 AM  CBC  WBC 4.0 - 10.5 K/uL 20.6  17.8  18.3   Hemoglobin 13.0 - 17.0 g/dL 8.2  8.3  8.9   Hematocrit 39.0 - 52.0 % 24.1  24.6  26.0   Platelets 150 - 400 K/uL 12  13  12        Latest Ref Rng & Units 07/10/2024    3:13 AM 07/09/2024    5:55 AM 07/08/2024    8:03 PM  CMP  Glucose 70 - 99 mg/dL 893  894    BUN 8 - 23 mg/dL 56  55    Creatinine 9.38 - 1.24 mg/dL 8.19  8.22    Sodium 864 - 145 mmol/L 138  134    Potassium 3.5 - 5.1 mmol/L 3.5  3.1  3.2   Chloride 98 - 111 mmol/L 101  100    CO2 22 - 32 mmol/L 21  22    Calcium 8.9 - 10.3 mg/dL 7.7  7.9    Total Protein 6.5 - 8.1 g/dL 6.0  6.3    Total Bilirubin 0.0 - 1.2 mg/dL 89.8  88.9    Alkaline Phos 38 - 126 U/L 119  132    AST 15 - 41 U/L 94  103    ALT 0 - 44 U/L 25  27      Micro Results: Recent Results (from the past 240 hours)  Blood Culture (routine x 2)     Status: Abnormal   Collection Time: 07/07/24  1:40 PM   Specimen: BLOOD LEFT ARM  Result Value Ref Range Status   Specimen  Description   Final    BLOOD LEFT ARM Performed at [facility], [address] [place], [place] [address]    Special Requests   Final    BOTTLES DRAWN AEROBIC AND ANAEROBIC Blood Culture results may not be optimal due to an inadequate volume of blood received in culture bottles Performed at [facility], [address] [place], [place] [address]    Culture  Setup Time   Final    GRAM POSITIVE COCCI IN BOTH AEROBIC AND ANAEROBIC BOTTLES CRITICAL VALUE NOTED.  VALUE IS CONSISTENT WITH PREVIOUSLY REPORTED AND CALLED VALUE.    Culture (A)  Final    STAPHYLOCOCCUS EPIDERMIDIS SUSCEPTIBILITIES PERFORMED ON PREVIOUS CULTURE WITHIN THE LAST 5 DAYS. Performed at [facility],  [address] [place], [place] [address]    Report Status 07/10/2024 FINAL  Final  Blood Culture (routine x 2)     Status: Abnormal   Collection Time: 07/07/24  1:40 PM   Specimen: BLOOD RIGHT ARM  Result Value Ref Range Status   Specimen Description   Final    BLOOD RIGHT ARM Performed at [facility], [address], [place], [place] [address]    Special Requests   Final    BOTTLES DRAWN AEROBIC AND ANAEROBIC Blood Culture results may not be optimal due to an inadequate volume of blood received in culture bottles Performed at [facility], [address] [place], [place] [address]    Culture  Setup Time   Final    IN BOTH AEROBIC AND ANAEROBIC BOTTLES GRAM POSITIVE COCCI CRITICAL RESULT CALLED TO, READ BACK BY AND VERIFIED WITH: JASON ROBBINS @ 07/08/2024 0328 AB    Culture STAPHYLOCOCCUS EPIDERMIDIS (A)  Final   Report Status 07/10/2024 FINAL  Final   Organism ID, Bacteria STAPHYLOCOCCUS EPIDERMIDIS  Final      Susceptibility   Staphylococcus epidermidis - MIC*    CIPROFLOXACIN  <=0.5 SENSITIVE Sensitive     ERYTHROMYCIN >=8 RESISTANT Resistant     GENTAMICIN <=0.5 SENSITIVE Sensitive     OXACILLIN <=0.25 SENSITIVE Sensitive     TETRACYCLINE <=1 SENSITIVE  Sensitive     VANCOMYCIN 1 SENSITIVE Sensitive     TRIMETH/SULFA <=10 SENSITIVE Sensitive     CLINDAMYCIN <=0.25 SENSITIVE Sensitive     RIFAMPIN <=0.5 SENSITIVE Sensitive     Inducible Clindamycin NEGATIVE Sensitive     * STAPHYLOCOCCUS EPIDERMIDIS  Blood Culture ID Panel (Reflexed)     Status: Abnormal   Collection Time: 07/07/24  1:40 PM  Result Value Ref Range Status   Enterococcus faecalis NOT DETECTED NOT DETECTED Final   Enterococcus Faecium NOT DETECTED NOT DETECTED Final   Listeria monocytogenes NOT DETECTED NOT DETECTED Final   Staphylococcus species DETECTED (A) NOT DETECTED Final    Comment: CRITICAL RESULT CALLED TO, READ BACK BY AND VERIFIED WITH: JASON ROBBINS @ 07/08/2024 0328 AB    Staphylococcus aureus (BCID) NOT DETECTED NOT DETECTED Final   Staphylococcus epidermidis DETECTED (A) NOT DETECTED Final    Comment: CRITICAL RESULT CALLED TO, READ BACK BY AND VERIFIED WITH: JASON ROBBINS @ 07/08/2024 0328 AB    Staphylococcus lugdunensis NOT DETECTED NOT DETECTED Final   Streptococcus species NOT DETECTED NOT DETECTED Final   Streptococcus agalactiae NOT DETECTED NOT DETECTED Final   Streptococcus pneumoniae NOT DETECTED NOT DETECTED Final   Streptococcus pyogenes NOT DETECTED NOT DETECTED Final   A.calcoaceticus-baumannii NOT DETECTED NOT DETECTED Final   Bacteroides fragilis NOT DETECTED NOT DETECTED Final   Enterobacterales NOT DETECTED NOT DETECTED Final   Enterobacter cloacae complex NOT DETECTED NOT DETECTED Final   Escherichia coli NOT DETECTED NOT DETECTED Final   Klebsiella aerogenes NOT DETECTED NOT DETECTED Final   Klebsiella oxytoca NOT DETECTED NOT DETECTED Final   Klebsiella pneumoniae NOT DETECTED NOT DETECTED Final   Proteus species NOT DETECTED NOT DETECTED Final   Salmonella species NOT DETECTED NOT DETECTED Final   Serratia marcescens NOT DETECTED NOT DETECTED Final   Haemophilus influenzae NOT DETECTED NOT DETECTED Final   Neisseria  meningitidis NOT DETECTED NOT DETECTED Final   Pseudomonas aeruginosa NOT DETECTED NOT DETECTED Final   Stenotrophomonas maltophilia NOT DETECTED NOT DETECTED Final   Candida albicans NOT DETECTED NOT DETECTED Final   Candida auris NOT DETECTED  NOT DETECTED Final   Candida glabrata NOT DETECTED NOT DETECTED Final   Candida krusei NOT DETECTED NOT DETECTED Final   Candida parapsilosis NOT DETECTED NOT DETECTED Final   Candida tropicalis NOT DETECTED NOT DETECTED Final   Cryptococcus neoformans/gattii NOT DETECTED NOT DETECTED Final   Methicillin resistance mecA/C NOT DETECTED NOT DETECTED Final    Comment: Performed at [facility], [address] [place], [place] [address]  MRSA Next Gen by PCR, Nasal     Status: None   Collection Time: 07/07/24  3:15 PM   Specimen: Nasal Mucosa; Nasal Swab  Result Value Ref Range Status   MRSA by PCR Next Gen NOT DETECTED NOT DETECTED Final    Comment: (NOTE) The GeneXpert MRSA Assay (FDA approved for NASAL specimens only), is one component of a comprehensive MRSA colonization surveillance program. It is not intended to diagnose MRSA infection nor to guide or monitor treatment for MRSA infections. Test performance is not FDA approved in patients less than 41 years old. Performed at [facility], [address] [place], [place] [address]   Resp panel by RT-PCR (RSV, Flu A&B, Covid) Anterior Nasal Swab     Status: None   Collection Time: 07/07/24  3:44 PM   Specimen: Anterior Nasal Swab  Result Value Ref Range Status   SARS Coronavirus 2 by RT PCR NEGATIVE NEGATIVE Final    Comment: (NOTE) SARS-CoV-2 target nucleic acids are NOT DETECTED.  The SARS-CoV-2 RNA is generally detectable in upper respiratory specimens during the acute phase of infection. The lowest concentration of SARS-CoV-2 viral copies this assay can detect is 138 copies/mL. A negative result does not preclude SARS-Cov-2 infection and should not be used as the  sole basis for treatment or other patient management decisions. A negative result may occur with  improper specimen collection/handling, submission of specimen other than nasopharyngeal swab, presence of viral mutation(s) within the areas targeted by this assay, and inadequate number of viral copies(<138 copies/mL). A negative result must be combined with clinical observations, patient history, and epidemiological information. The expected result is Negative.  Fact Sheet for Patients:  bloggercourse.com  Fact Sheet for Healthcare Providers:  seriousbroker.it  This test is no t yet approved or cleared by the [place]  FDA and  has been authorized for detection and/or diagnosis of SARS-CoV-2 by FDA under an Emergency Use Authorization (EUA). This EUA will remain  in effect (meaning this test can be used) for the duration of the COVID-19 declaration under Section 564(b)(1) of the Act, 21 U.S.C.section 360bbb-3(b)(1), unless the authorization is terminated  or revoked sooner.       Influenza A by PCR NEGATIVE NEGATIVE Final   Influenza B by PCR NEGATIVE NEGATIVE Final    Comment: (NOTE) The Xpert Xpress SARS-CoV-2/FLU/RSV plus assay is intended as an aid in the diagnosis of influenza from Nasopharyngeal swab specimens and should not be used as a sole basis for treatment. Nasal washings and aspirates are unacceptable for Xpert Xpress SARS-CoV-2/FLU/RSV testing.  Fact Sheet for Patients: bloggercourse.com  Fact Sheet for Healthcare Providers: seriousbroker.it  This test is not yet approved or cleared by the [place]  FDA and has been authorized for detection and/or diagnosis of SARS-CoV-2 by FDA under an Emergency Use Authorization (EUA). This EUA will remain in effect (meaning this test can be used) for the duration of the COVID-19 declaration under Section 564(b)(1) of the  Act, 21 U.S.C. section 360bbb-3(b)(1), unless the authorization is terminated or revoked.  Resp Syncytial Virus by PCR NEGATIVE NEGATIVE Final    Comment: (NOTE) Fact Sheet for Patients: bloggercourse.com  Fact Sheet for Healthcare Providers: seriousbroker.it  This test is not yet approved or cleared by the [place]  FDA and has been authorized for detection and/or diagnosis of SARS-CoV-2 by FDA under an Emergency Use Authorization (EUA). This EUA will remain in effect (meaning this test can be used) for the duration of the COVID-19 declaration under Section 564(b)(1) of the Act, 21 U.S.C. section 360bbb-3(b)(1), unless the authorization is terminated or revoked.  Performed at [facility], [address] [place], [place] [address]   Respiratory (~20 pathogens) panel by PCR     Status: None   Collection Time: 07/07/24  3:44 PM   Specimen: Nasopharyngeal Swab; Respiratory  Result Value Ref Range Status   Adenovirus NOT DETECTED NOT DETECTED Final   Coronavirus 229E NOT DETECTED NOT DETECTED Final    Comment: (NOTE) The Coronavirus on the Respiratory Panel, DOES NOT test for the novel  Coronavirus (2019 nCoV)    Coronavirus HKU1 NOT DETECTED NOT DETECTED Final   Coronavirus NL63 NOT DETECTED NOT DETECTED Final   Coronavirus OC43 NOT DETECTED NOT DETECTED Final   Metapneumovirus NOT DETECTED NOT DETECTED Final   Rhinovirus / Enterovirus NOT DETECTED NOT DETECTED Final   Influenza A NOT DETECTED NOT DETECTED Final   Influenza B NOT DETECTED NOT DETECTED Final   Parainfluenza Virus 1 NOT DETECTED NOT DETECTED Final   Parainfluenza Virus 2 NOT DETECTED NOT DETECTED Final   Parainfluenza Virus 3 NOT DETECTED NOT DETECTED Final   Parainfluenza Virus 4 NOT DETECTED NOT DETECTED Final   Respiratory Syncytial Virus NOT DETECTED NOT DETECTED Final   Bordetella pertussis NOT DETECTED NOT DETECTED Final   Bordetella  Parapertussis NOT DETECTED NOT DETECTED Final   Chlamydophila pneumoniae NOT DETECTED NOT DETECTED Final   Mycoplasma pneumoniae NOT DETECTED NOT DETECTED Final    Comment: Performed at [facility], [address], [place], [place] [address]  Culture, blood (Routine X 2) w Reflex to ID Panel     Status: None (Preliminary result)   Collection Time: 07/09/24 10:52 AM   Specimen: BLOOD  Result Value Ref Range Status   Specimen Description BLOOD BLOOD LEFT HAND  Final   Special Requests   Final    BOTTLES DRAWN AEROBIC ONLY Blood Culture adequate volume   Culture   Final    NO GROWTH < 24 HOURS Performed at [facility], [address] [place], [place] [address]    Report Status PENDING  Incomplete  Culture, blood (Routine X 2) w Reflex to ID Panel     Status: None (Preliminary result)   Collection Time: 07/09/24 11:07 AM   Specimen: BLOOD  Result Value Ref Range Status   Specimen Description BLOOD BLOOD LEFT HAND  Final   Special Requests   Final    BOTTLES DRAWN AEROBIC AND ANAEROBIC Blood Culture adequate volume   Culture   Final    NO GROWTH < 24 HOURS Performed at [facility], [address] [place], [place] [address]    Report Status PENDING  Incomplete   Studies/Results: DG Abd 1 View Result Date: 07/10/2024 CLINICAL DATA:  NG placement. EXAM: ABDOMEN - 1 VIEW COMPARISON:  CT abdomen pelvis dated 07/07/2024. FINDINGS: Enteric tube with tip and side-port in the left upper abdomen the proximal stomach. No bowel dilatation. No acute osseous pathology. IMPRESSION: Enteric tube with tip and side-port in  the proximal stomach. Electronically Signed   By: Vanetta Chou M.D.   On: 07/10/2024 14:36   DG Chest Port 1 View Result Date: 07/10/2024 CLINICAL DATA:  Central line placement. EXAM: PORTABLE CHEST 1 VIEW COMPARISON:  Chest radiograph dated 07/10/2024. FINDINGS: Left IJ central venous line with tip over central SVC. Endotracheal tube approximately 5  cm above the carina and enteric tube extends below diaphragm with tip beyond the inferior margin of the image. Bilateral pulmonary opacities as seen on the earlier radiograph. No pneumothorax. Stable cardiac silhouette. No acute osseous pathology. IMPRESSION: Left IJ central venous line with tip over central SVC. No pneumothorax. Electronically Signed   By: Vanetta Chou M.D.   On: 07/10/2024 14:34   DG Chest Port 1 View Result Date: 07/10/2024 EXAM: 1 VIEW XRAY OF THE CHEST 07/10/2024 11:13:29 AM COMPARISON: Same day. CLINICAL HISTORY: [id number] Encounter for intubation [id number]. Encounter for intubation Encounter for intubation FINDINGS: LINES, TUBES AND DEVICES: Endotracheal tube tip is seen 1 cm above the carina and directed slightly towards the right mainstem bronchus. Withdrawal by 2 to 3 cm is recommended. LUNGS AND PLEURA: Opacities are again noted consistent with pneumonia or possibly edema. No pleural effusion. No pneumothorax. HEART AND MEDIASTINUM: No acute abnormality of the cardiac and mediastinal silhouettes. BONES AND SOFT TISSUES: No acute osseous abnormality. IMPRESSION: 1. Endotracheal tube tip 1 cm above the carina with slight right mainstem orientation; recommend withdrawal by 23 cm. 2. Opacities consistent with pneumonia or edema. Electronically signed by: Lynwood Seip MD 07/10/2024 11:30 AM EDT RP Workstation: [id number]   DG Chest Port 1 View Result Date: 07/10/2024 EXAM: 1 VIEW(S) XRAY OF THE CHEST 07/10/2024 03:26:54 AM COMPARISON: Chest x-ray dated 11/10/2023. CLINICAL HISTORY: [id number] Hypoxia FINDINGS: LUNGS AND PLEURA: Interval increase in bilateral perihilar, right greater than left, streaky consolidative airspace opacity. Left upper lobe patchy airspace opacities. Blunting of the right costophrenic angle with likely underlying trace pleural effusion. Left costophrenic angle is collimated off view. No pulmonary edema. No pneumothorax. HEART AND MEDIASTINUM: No acute abnormality of  the cardiac and mediastinal silhouettes. BONES AND SOFT TISSUES: No acute osseous abnormality. IMPRESSION: 1. Interval increase in bilateral perihilar, right greater than left, consolidative and patchy airspace opacities, most consistent with multifocal pneumonia or aspiration pneumonitis. 2. Blunting of the right costophrenic angle with likely trace pleural effusion. Electronically signed by: Morgane Naveau MD 07/10/2024 03:47 AM EDT RP Workstation: [id number]   DG Chest Port 1 View Result Date: 07/09/2024 EXAM: 1 VIEW(S) XRAY OF THE CHEST 07/09/2024 04:45:00 PM COMPARISON: Chest x-ray 07/07/2024. CLINICAL HISTORY: [id number] Dyspnea [id number]. Dyspnea FINDINGS: LUNGS AND PLEURA: There is new bilateral multifocal airspace consolidation, right greater than left. This is predominantly central in location. No pulmonary edema. No pleural effusion. No pneumothorax. HEART AND MEDIASTINUM: No acute abnormality of the cardiac and mediastinal silhouettes. BONES AND SOFT TISSUES: There are multiple healed left-sided rib fractures. IMPRESSION: 1. New bilateral multifocal airspace consolidation, right greater than left, predominantly central in location. Electronically signed by: Greig Pique MD 07/09/2024 09:42 PM EDT RP Workstation: [id number]   MR CARDIAC MORPHOLOGY W WO CONTRAST Result Date: 07/09/2024 CLINICAL DATA:  Aortic regurgitation, possible aortic vegetation EXAM: CARDIAC MRI TECHNIQUE: The patient was scanned on a 1.5 Tesla Siemens magnet. A dedicated cardiac coil was used. Functional imaging was done using Fiesta sequences. 2,3, and 4 chamber views were done to assess for RWMA's. Modified Simpson's rule using a short axis stack was used to calculate an ejection fraction on a dedicated  work [employer]. The patient received 8 cc of Gadavist. After 10 minutes inversion recovery sequences were used to assess for infiltration and scar tissue. Velocity flow mapping performed in the ascending aorta  and main pulmonary artery. CONTRAST:  8 cc  of Gadavist FINDINGS: 1. Normal left ventricular size and thickness. Mildly reduced LV systolic function (LVEF = 45%). There is global hypokinesis. There is no late gadolinium enhancement in the left ventricular myocardium. LVEDV: 172 ml LVESV: 94 ml SV: 77 ml CO: 4 L/min Myocardial mass: 90 g 2. Normal right ventricular size, thickness and low normal systolic function. There are no regional wall motion abnormalities. 3.  Normal left and right atrial size. 4. Normal size of the aortic root, ascending aorta and pulmonary artery. 5. Aortic valve prolapse, moderate aortic valve regurgitation. Regurgitant volume 51 ml, regurgitant fraction 53%. 6.  Normal pericardium.  No pericardial effusion. IMPRESSION: 1.  Mildly reduced LV systolic function.  LVEF 45% 2.  No LGE or scar. 3.  Aortic valve prolapse and moderate aortic regurgitation. 4.  Poor image quality, cannot rule out aortic valve vegetation. 5.  Normal RV systolic function. Electronically Signed   By: Redell Cave M.D.   On: 07/09/2024 17:11   MR CARDIAC VELOCITY FLOW MAP Result Date: 07/09/2024 CLINICAL DATA:  Aortic regurgitation, possible aortic vegetation EXAM: CARDIAC MRI TECHNIQUE: The patient was scanned on a 1.5 Tesla Siemens magnet. A dedicated cardiac coil was used. Functional imaging was done using Fiesta sequences. 2,3, and 4 chamber views were done to assess for RWMA's. Modified Simpson's rule using a short axis stack was used to calculate an ejection fraction on a dedicated work [employer]. The patient received 8 cc of Gadavist. After 10 minutes inversion recovery sequences were used to assess for infiltration and scar tissue. Velocity flow mapping performed in the ascending aorta and main pulmonary artery. CONTRAST:  8 cc  of Gadavist FINDINGS: 1. Normal left ventricular size and thickness. Mildly reduced LV systolic function (LVEF = 45%). There is global hypokinesis. There is  no late gadolinium enhancement in the left ventricular myocardium. LVEDV: 172 ml LVESV: 94 ml SV: 77 ml CO: 4 L/min Myocardial mass: 90 g 2. Normal right ventricular size, thickness and low normal systolic function. There are no regional wall motion abnormalities. 3.  Normal left and right atrial size. 4. Normal size of the aortic root, ascending aorta and pulmonary artery. 5. Aortic valve prolapse, moderate aortic valve regurgitation. Regurgitant volume 51 ml, regurgitant fraction 53%. 6.  Normal pericardium.  No pericardial effusion. IMPRESSION: 1.  Mildly reduced LV systolic function.  LVEF 45% 2.  No LGE or scar. 3.  Aortic valve prolapse and moderate aortic regurgitation. 4.  Poor image quality, cannot rule out aortic valve vegetation. 5.  Normal RV systolic function. Electronically Signed   By: Redell Cave M.D.   On: 07/09/2024 17:11   MR CARDIAC VELOCITY FLOW MAP Result Date: 07/09/2024 CLINICAL DATA:  Aortic regurgitation, possible aortic vegetation EXAM: CARDIAC MRI TECHNIQUE: The patient was scanned on a 1.5 Tesla Siemens magnet. A dedicated cardiac coil was used. Functional imaging was done using Fiesta sequences. 2,3, and 4 chamber views were done to assess for RWMA's. Modified Simpson's rule using a short axis stack was used to calculate an ejection fraction on a dedicated work [employer]. The patient received 8 cc of Gadavist. After 10 minutes inversion recovery sequences were used to assess for infiltration and scar tissue. Velocity  flow mapping performed in the ascending aorta and main pulmonary artery. CONTRAST:  8 cc  of Gadavist FINDINGS: 1. Normal left ventricular size and thickness. Mildly reduced LV systolic function (LVEF = 45%). There is global hypokinesis. There is no late gadolinium enhancement in the left ventricular myocardium. LVEDV: 172 ml LVESV: 94 ml SV: 77 ml CO: 4 L/min Myocardial mass: 90 g 2. Normal right ventricular size, thickness and low normal  systolic function. There are no regional wall motion abnormalities. 3.  Normal left and right atrial size. 4. Normal size of the aortic root, ascending aorta and pulmonary artery. 5. Aortic valve prolapse, moderate aortic valve regurgitation. Regurgitant volume 51 ml, regurgitant fraction 53%. 6.  Normal pericardium.  No pericardial effusion. IMPRESSION: 1.  Mildly reduced LV systolic function.  LVEF 45% 2.  No LGE or scar. 3.  Aortic valve prolapse and moderate aortic regurgitation. 4.  Poor image quality, cannot rule out aortic valve vegetation. 5.  Normal RV systolic function. Electronically Signed   By: Redell Cave M.D.   On: 07/09/2024 17:11   Medications: I have reviewed the patient's current medications. Prior to Admission:  No medications prior to admission.   Scheduled:  Chlorhexidine Gluconate Cloth  6 each Topical Daily   docusate  100 mg Per Tube BID   folic acid  1 mg Intravenous Daily   furosemide  80 mg Intravenous Once   lactulose  300 mL Rectal BID   pantoprazole (PROTONIX) IV  40 mg Intravenous Q12H   PHENObarbital  97.5 mg Intravenous Q8H   Followed by   [START ON 07/11/2024] PHENObarbital  65 mg Intravenous Q8H   Followed by   [START ON 07/13/2024] PHENObarbital  32.5 mg Intravenous Q8H   polyethylene glycol  17 g Per Tube Daily   sodium chloride  flush  10-40 mL Intracatheter Q12H   [START ON 07/11/2024] thiamine (VITAMIN B1) injection  100 mg Intravenous Daily   Continuous:  sodium chloride  20 mL/hr at 07/10/24 1424   albumin human Stopped (07/10/24 1122)   doxycycline (VIBRAMYCIN) IV Stopped (07/10/24 0810)   norepinephrine (LEVOPHED) Adult infusion 13 mcg/min (07/10/24 1424)   piperacillin-tazobactam Stopped (07/10/24 0921)   propofol (DIPRIVAN) infusion 5 mcg/kg/min (07/10/24 1424)   [START ON 07/11/2024] vancomycin     vancomycin 150 mL/hr at 07/10/24 1424   PRN:docusate sodium, fentaNYL (SUBLIMAZE) injection, fentaNYL (SUBLIMAZE) injection, midazolam  PF, mouth rinse, polyethylene glycol, sodium chloride  flush Anti-infectives (From admission, onward)    Start     Dose/Rate Route Frequency Ordered Stop   07/11/24 1000  vancomycin (VANCOCIN) IVPB 1000 mg/200 mL premix        1,000 mg 200 mL/hr over 60 Minutes Intravenous Every 24 hours 07/10/24 1331     07/10/24 1030  vancomycin (VANCOREADY) IVPB 1500 mg/300 mL        1,500 mg 150 mL/hr over 120 Minutes Intravenous  Once 07/10/24 0920     07/10/24 0600  doxycycline (VIBRAMYCIN) 100 mg in sodium chloride  0.9 % 250 mL IVPB        100 mg 125 mL/hr over 120 Minutes Intravenous Every 12 hours 07/10/24 0428 07/17/24 0559   07/07/24 2200  piperacillin-tazobactam (ZOSYN) IVPB 3.375 g        3.375 g 12.5 mL/hr over 240 Minutes Intravenous Every 8 hours 07/07/24 1505     07/07/24 2100  cefTRIAXone (ROCEPHIN) 2 g in sodium chloride  0.9 % 100 mL IVPB  Status:  Discontinued  2 g 200 mL/hr over 30 Minutes Intravenous Every 24 hours 07/07/24 1439 07/07/24 1453   07/07/24 1330  ceFEPIme (MAXIPIME) 2 g in sodium chloride  0.9 % 100 mL IVPB        2 g 200 mL/hr over 30 Minutes Intravenous  Once 07/07/24 1325 07/07/24 1438   07/07/24 1330  metroNIDAZOLE (FLAGYL) IVPB 500 mg        500 mg 100 mL/hr over 60 Minutes Intravenous  Once 07/07/24 1325 07/07/24 1810   07/07/24 1330  vancomycin (VANCOCIN) IVPB 1000 mg/200 mL premix        1,000 mg 200 mL/hr over 60 Minutes Intravenous  Once 07/07/24 1325 07/07/24 1910      Scheduled Meds:  Chlorhexidine Gluconate Cloth  6 each Topical Daily   docusate  100 mg Per Tube BID   folic acid  1 mg Intravenous Daily   furosemide  80 mg Intravenous Once   lactulose  300 mL Rectal BID   pantoprazole (PROTONIX) IV  40 mg Intravenous Q12H   PHENObarbital  97.5 mg Intravenous Q8H   Followed by   NOREEN ON 07/11/2024] PHENObarbital  65 mg Intravenous Q8H   Followed by   [START ON 07/13/2024] PHENObarbital  32.5 mg Intravenous Q8H   polyethylene glycol  17 g  Per Tube Daily   sodium chloride  flush  10-40 mL Intracatheter Q12H   [START ON 07/11/2024] thiamine (VITAMIN B1) injection  100 mg Intravenous Daily   Continuous Infusions:  sodium chloride  20 mL/hr at 07/10/24 1424   albumin human Stopped (07/10/24 1122)   doxycycline (VIBRAMYCIN) IV Stopped (07/10/24 0810)   norepinephrine (LEVOPHED) Adult infusion 13 mcg/min (07/10/24 1424)   piperacillin-tazobactam Stopped (07/10/24 0921)   propofol (DIPRIVAN) infusion 5 mcg/kg/min (07/10/24 1424)   [START ON 07/11/2024] vancomycin     vancomycin 150 mL/hr at 07/10/24 1424   PRN Meds:.docusate sodium, fentaNYL (SUBLIMAZE) injection, fentaNYL (SUBLIMAZE) injection, midazolam PF, mouth rinse, polyethylene glycol, sodium chloride  flush   Assessment: Principal Problem:   Acute metabolic encephalopathy  63 year old male with history of alcoholic liver disease admitted with alcoholic liver failure, altered mental status is found to have severe thrombocytopenia, Staph epidermidis bacteremia concerning for subacute bacterial endocarditis, complicated by acute respiratory failure requiring intubation and mechanical ventilation, in multiorgan failure  Plan: Acute alcoholic hepatitis with history of chronic alcoholic liver disease Pancytopenia and coagulopathy in setting of sepsis, concern for DIC, bone marrow suppression from alcohol abuse On broad-spectrum antibiotics No evidence of ascites Manage volume status per ICU team and cardiology Do not recommend prednisone in setting of severe sepsis, with bacteremia, concerning for subacute bacterial endocarditis Overall prognosis is guarded  GI will sign off at this time, please call us  back with questions or concerns   LOS: 3 days   Rubena Roseman 07/10/2024, 2:41 PM

## 2024-07-09 NOTE — Consult Note (Signed)
 [facility] CLINIC CARDIOLOGY CONSULT NOTE       Patient ID: Sergio Curry MRN: [id number] DOB/AGE: [birth year] 63 y.o.  Admit date: 07/07/2024 Referring Physician Lonell Moose, NP Primary Physician Patient, No Pcp Per  Primary Cardiologist None Reason for Consultation Possible SBE  HPI: [name] is a 63 y.o. male  with a past medical history of alcohol use, liver disease who presented to the ED on 07/07/2024 for altered mental status and jaundice. Echo this admission with concern for SBE of anterior leaflet of aortic valve. Cardiology was consulted for further evaluation.   Patient was brought to the ED for further evaluation after being noted to be altered and jaundiced.  Workup in the ED notable for creatinine 1.68, potassium 2.9, hemoglobin 6.6, WBC 15.3, Plt 6. Lactic acid trended >9.0 > 8.8 > 6.4 > 1.8 > 1.3. Troponins 171 > 154. CT head without acute abnormality, CXR without acute abnormality, CT abd pelvis with hepatomegaly and hepatic steatosis, possible colitis.  Echo done yesterday demonstrated EF 45-50%, global hypokinesis with moderate to severe dilation of LV, mobile mass noted on anterior leaflet of aortic valve prolapsing into outflow tract with mild to moderate AR.  TEE recommended for additional evaluation.  At the time of evaluation this morning patient is resting in hospital bed with friend at bedside.  He denies any pain today and states that he is feeling okay overall.  Patient noted to be somewhat somnolent during exam.  Labs today with persistently low platelets.  Not a candidate for TEE at this time given thrombocytopenia.  Review of systems complete and found to be negative unless listed above    Past Medical History:  Diagnosis Date   Seizures (HCC)     No past surgical history on file.  No medications prior to admission.   Social History   Socioeconomic History   Marital status: Single    Spouse name: Not on file   Number of children: Not on file    Years of education: Not on file   Highest education level: Not on file  Occupational History   Not on file  Tobacco Use   Smoking status: Former    Types: Cigarettes   Smokeless tobacco: Never  Substance and Sexual Activity   Alcohol use: Yes    Alcohol/week: 7.0 standard drinks of alcohol    Types: 4 Cans of beer, 3 Shots of liquor per week   Drug use: Yes    Types: Marijuana   Sexual activity: Not Currently    Birth control/protection: None  Other Topics Concern   Not on file  Social History Narrative   Not on file   Social Drivers of Health   Financial Resource Strain: Not on file  Food Insecurity: No Food Insecurity (07/07/2024)   Hunger Vital Sign    Worried About Running Out of Food in the Last Year: Never true    Ran Out of Food in the Last Year: Never true  Transportation Needs: No Transportation Needs (07/07/2024)   PRAPARE - Administrator, Civil Service (Medical): No    Lack of Transportation (Non-Medical): No  Physical Activity: Not on file  Stress: Not on file  Social Connections: Not on file  Intimate Partner Violence: Not At Risk (07/07/2024)   Humiliation, Afraid, Rape, and Kick questionnaire    Fear of Current or Ex-Partner: No    Emotionally Abused: No    Physically Abused: No    Sexually Abused: No    No  family history on file.   Vitals:   07/09/24 0845 07/09/24 0900 07/09/24 0915 07/09/24 0930  BP:  (!) 97/40  (!) 110/32  Pulse: 84 85 87 83  Resp: (!) 28 (!) 28 (!) 41 (!) 30  Temp:      TempSrc:      SpO2: 99% 100% 99% 99%  Weight:      Height:        PHYSICAL EXAM General: Chronically ill appearing male, well nourished, in no acute distress. HEENT: Normocephalic and atraumatic. Neck: No JVD.  Lungs: Normal respiratory effort on supplemental O2. Clear bilaterally to auscultation. No wheezes, crackles, rhonchi.  Heart: HRRR. Normal S1 and S2 without gallops or murmurs.  Abdomen: Non-distended appearing.  Msk: Normal strength  and tone for age. Extremities: Warm and well perfused. No clubbing, cyanosis. No edema.  Neuro: Alert and oriented X 3. Psych: Answers questions appropriately.   Labs: Basic Metabolic Panel: Recent Labs    07/08/24 0426 07/08/24 2003 07/09/24 0555  NA 133*  --  134*  K 2.9* 3.2* 3.1*  CL 96*  --  100  CO2 23  --  22  GLUCOSE 128*  --  105*  BUN 40*  --  55*  CREATININE 1.42*  --  1.77*  CALCIUM 7.4*  --  7.9*  MG 2.3  --  2.6*  PHOS 3.9  --  3.1   Liver Function Tests: Recent Labs    07/08/24 0426 07/09/24 0555  AST 100* 103*  ALT 25 27  ALKPHOS 118 132*  BILITOT 8.3* 11.0*  PROT 5.4* 6.3*  ALBUMIN 1.7* 1.9*   Recent Labs    07/07/24 1210  LIPASE 16   CBC: Recent Labs    07/07/24 1210 07/08/24 0426 07/09/24 0555  WBC 15.3* 17.9* 18.3*  NEUTROABS 11.1* 12.3*  --   HGB 6.6* 7.9* 8.9*  HCT 19.8* 22.6* 26.0*  MCV 111.2* 95.8 98.1  PLT 6* 25* 12*   Cardiac Enzymes: Recent Labs    07/07/24 1210 07/07/24 1340  TROPONINIHS 171* 154*   BNP: No results for input(s): BNP in the last 72 hours. D-Dimer: No results for input(s): DDIMER in the last 72 hours. Hemoglobin A1C: No results for input(s): HGBA1C in the last 72 hours. Fasting Lipid Panel: No results for input(s): CHOL, HDL, LDLCALC, TRIG, CHOLHDL, LDLDIRECT in the last 72 hours. Thyroid Function Tests: No results for input(s): TSH, T4TOTAL, T3FREE, THYROIDAB in the last 72 hours.  Invalid input(s): FREET3 Anemia Panel: Recent Labs    07/07/24 1338 07/07/24 1559  VITAMINB12  --  2,972*  FOLATE 8.1  --      Radiology: ECHOCARDIOGRAM COMPLETE Result Date: 07/08/2024    ECHOCARDIOGRAM REPORT   Patient Name:   Sergio Curry Date of Exam: 07/08/2024 Medical Rec #:  [id number]        Height:       69.0 in Accession #:    [id number]       Weight:       129.4 lb Date of Birth:  December 12, 1960         BSA:          1.717 m Patient Age:    63 years         BP:           133/32  mmHg Patient Gender: M                HR:  84 bpm. Exam Location:  [facility] Procedure: 2D Echo, Cardiac Doppler and Color Doppler (Both Spectral and Color            Flow Doppler were utilized during procedure). Indications:     Elevated Troponin  History:         Patient has no prior history of Echocardiogram examinations.  Sonographer:     Thedora Louder RDCS, FASE Referring Phys:  [id number] LONELL KANDICE MOOSE Diagnosing Phys: Cara JONETTA Lovelace MD  Sonographer Comments: Technically difficult study due to poor echo windows. Image acquisition challenging due to patient behavioral factors. IMPRESSIONS  1. Possible SBE of anterior leaflet. Recommend TEE.  2. Technically difficult study.  3. Left ventricular ejection fraction, by estimation, is 45 to 50%. The left ventricle has mildly decreased function. The left ventricle demonstrates global hypokinesis. The left ventricular internal cavity size was moderately to severely dilated. Left ventricular diastolic function could not be evaluated.  4. Right ventricular systolic function is normal. The right ventricular size is normal.  5. Left atrial size was mildly dilated.  6. The mitral valve is myxomatous. Trivial mitral valve regurgitation.  7. 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 SBE can not be excluded. Consider TEE.. The aortic valve is calcified. Aortic valve regurgitation is mild to moderate. Aortic valve sclerosis/calcification is present, without any evidence of aortic stenosis. Conclusion(s)/Recommendation(s): Poor windows for evaluation of left ventricular function by transthoracic echocardiography. Would recommend an alternative means of evaluation. Findings concerning for aortic valve vegetation, would recommend a Transesophageal Echocardiogram for clarification. FINDINGS  Left Ventricle: Left ventricular ejection fraction, by estimation, is 45 to 50%. The left ventricle has mildly decreased function. The left ventricle  demonstrates global hypokinesis. Strain was performed and the global longitudinal strain is indeterminate. The left ventricular internal cavity size was moderately to severely dilated. There is no left ventricular hypertrophy. Left ventricular diastolic function could not be evaluated. Right Ventricle: The right ventricular size is normal. No increase in right ventricular wall thickness. Right ventricular systolic function is normal. Left Atrium: Left atrial size was mildly dilated. Right Atrium: Right atrial size was normal in size. Pericardium: There is no evidence of pericardial effusion. Mitral Valve: The mitral valve is myxomatous. Trivial mitral valve regurgitation. Tricuspid Valve: The tricuspid valve is normal in structure. Tricuspid valve regurgitation is trivial. Aortic Valve: Mobile mass on anterior leaflet prolapsing inyo the outflow tract. SBE can not be excluded. Consider TEE. The aortic valve is calcified. Aortic valve regurgitation is mild to moderate. Aortic valve sclerosis/calcification is present, without any evidence of aortic stenosis. Aortic valve peak gradient measures 8.2 mmHg. Pulmonic Valve: The pulmonic valve was normal in structure. Pulmonic valve regurgitation is not visualized. Aorta: The ascending aorta was not well visualized. IAS/Shunts: No atrial level shunt detected by color flow Doppler. Additional Comments: Possible SBE of anterior leaflet. Recommend TEE. Technically difficult study. 3D was performed not requiring image post processing on an independent workstation and was indeterminate.  LEFT VENTRICLE PLAX 2D LVIDd:         6.10 cm LVIDs:         4.70 cm LV PW:         1.10 cm LV IVS:        1.00 cm  LV Volumes (MOD) LV vol d, MOD A4C: 90.4 ml LV vol s, MOD A4C: 29.6 ml LV SV MOD A4C:     90.4 ml LEFT ATRIUM  Index LA diam:      4.10 cm 2.39 cm/m LA Vol (A4C): 26.0 ml 15.14 ml/m  AORTIC VALVE              PULMONIC VALVE AV Vmax:      143.00 cm/s RVOT Peak grad: 1  mmHg AV Peak Grad: 8.2 mmHg LVOT Vmax:    91.80 cm/s LVOT Vmean:   62.000 cm/s LVOT VTI:     0.196 m  AORTA Ao Asc diam: 3.10 cm  SHUNTS Systemic VTI: 0.20 m Cara JONETTA Lovelace MD Electronically signed by Cara JONETTA Lovelace MD Signature Date/Time: 07/08/2024/11:40:39 AM    Final    US  ABDOMEN LIMITED WITH LIVER DOPPLER Result Date: 07/08/2024 CLINICAL DATA:  63 year old male with history of hyperbilirubinemia. EXAM: DUPLEX ULTRASOUND OF LIVER TECHNIQUE: Color and duplex Doppler ultrasound was performed to evaluate the hepatic in-flow and out-flow vessels. COMPARISON:  None Available. FINDINGS: Liver: Diffusely coarsened echotexture with increased echogenicity normal hepatic contour without nodularity. No focal lesion, mass or intrahepatic biliary ductal dilatation. Main Portal Vein size: 0.83 cm Portal Vein Velocities Main Prox:  23 cm/sec, antegrade Main Dist:  23 cm/sec, antegrade Right: Not visualized. Left: Not visualized. Hepatic Vein Velocities Right: Not visualized. Middle:  14.5 cm/sec Left: Not visualized. IVC: Present and patent with normal respiratory phasicity. Hepatic Artery Velocity:  216 cm/sec Splenic Vein Velocity: Not visualized. Spleen: 8.8 cm x 8.1 cm x 3.6 cm with a total volume of 134 cm^3 (411 cm^3 is upper limit normal) Portal Vein Occlusion/Thrombus: No Splenic Vein Occlusion/Thrombus: No Ascites: None Varices: None IMPRESSION: 1. Limited evaluation. The visualized portal vasculature is patent with antegrade flow. 2. Diffusely coarsened and echogenic hepatic parenchyma as could be seen with hepatic steatosis or acute hepatitis. 3. No evidence of biliary ductal dilation. Ester Sides, MD [facility] Electronically Signed   By: Ester Sides M.D.   On: 07/08/2024 06:30   CT ABDOMEN PELVIS W CONTRAST Result Date: 07/07/2024 CLINICAL DATA:  Acute abdominal pain. Altered mental status and jaundice. EXAM: CT ABDOMEN AND PELVIS WITH  CONTRAST TECHNIQUE: Multidetector CT imaging of the abdomen and pelvis was performed using the standard protocol following bolus administration of intravenous contrast. RADIATION DOSE REDUCTION: This exam was performed according to the departmental dose-optimization program which includes automated exposure control, adjustment of the mA and/or kV according to patient size and/or use of iterative reconstruction technique. CONTRAST:  75mL OMNIPAQUE IOHEXOL 300 MG/ML  SOLN COMPARISON:  None Available. FINDINGS: Lower chest: Bandlike areas of atelectasis within both lower lobes. Trace pleural thickening without significant effusion. Hepatobiliary: Enlarged liver spanning 18.9 cm cranial caudal. Advanced hepatic steatosis. Allowing for motion artifact, no focal liver abnormality. There may be subtle capsular nodularity, although motion obscures assessment. Small gallstone within minimally distended gallbladder. No biliary dilatation. Pancreas: Parenchymal atrophy. No ductal dilatation or inflammation. No evidence of pancreatic mass. Spleen: No splenomegaly.  No focal splenic abnormality. Adrenals/Urinary Tract: No adrenal nodule. No hydronephrosis. Symmetric bilateral perinephric stranding. No renal calculi or suspicious renal abnormality. Absent excretion on delayed phase imaging. Partially distended urinary bladder, mildly thick walled. Stomach/Bowel: Small hiatal hernia. Decompressed stomach. Small bowel is decompressed. Normal appendix is tentatively visualized, regardless no appendicitis. Mild wall thickening about the ascending and hepatic flexure of the colon. Moderate stool within the left colon. No bowel obstruction. Vascular/Lymphatic: Aortic atherosclerosis. No aneurysm. The portal vein is patent allowing for motion. No abdominopelvic adenopathy. Reproductive: Prostate is unremarkable. Other: Trace free fluid in  the pelvis, but no significant ascites. No free air. No abdominal wall hernia. Musculoskeletal:  Remote left rib fractures. Chronic hip arthropathy and postsurgical change. No acute osseous findings. IMPRESSION: 1. Hepatomegaly and advanced hepatic steatosis. There may be subtle capsular nodularity, although motion obscures assessment. Recommend correlation with any clinical or laboratory findings of cirrhosis. 2. Mild wall thickening about the ascending and hepatic flexure of the colon, suspicious for colitis. 3. Cholelithiasis without gallbladder inflammation. 4. Absent renal excretion on delayed phase imaging, often seen with renal dysfunction. Aortic Atherosclerosis (ICD10-I70.0). Electronically Signed   By: Andrea Gasman M.D.   On: 07/07/2024 14:19   DG Chest Port 1 View Result Date: 07/07/2024 CLINICAL DATA:  Possible sepsis. Altered mental status and jaundice. Hypothermia. EXAM: PORTABLE CHEST 1 VIEW COMPARISON:  Head FINDINGS: The heart size and mediastinal contours are within normal limits. No consolidation, effusion, or pneumothorax. Old rib fractures are present bilaterally. IMPRESSION: No active disease. Electronically Signed   By: Leita Birmingham M.D.   On: 07/07/2024 14:15   CT Head Wo Contrast Result Date: 07/07/2024 EXAM: CT HEAD WITHOUT CONTRAST 07/07/2024 12:59:28 PM TECHNIQUE: CT of the head was performed without the administration of intravenous contrast. Automated exposure control, iterative reconstruction, and/or weight based adjustment of the mA/kV was utilized to reduce the radiation dose to as low as reasonably achievable. COMPARISON: 01/06/2016 CLINICAL HISTORY: Mental status change, unknown cause. AMS. FINDINGS: BRAIN AND VENTRICLES: No acute hemorrhage. No evidence of acute infarct. No hydrocephalus. No extra-axial collection. No mass effect or midline shift. Prominence of the sulci and ventricles compatible with brain atrophy. Hypoattenuating foci in the cerebral white matter, most likely representing chronic small vessel disease. ORBITS: No acute abnormality. SINUSES: No  acute abnormality. SOFT TISSUES AND SKULL: No acute soft tissue abnormality. No skull fracture. IMPRESSION: 1. No acute intracranial abnormality. Electronically signed by: Birmingham Calk MD 07/07/2024 01:04 PM EDT RP Workstation: [id number]    ECHO as above  TELEMETRY (personally reviewed): sinus rhythm 1st degree AVB rate 80s  EKG (personally reviewed): no EKG on admission for review  Data reviewed by me 07/09/2024: last 24h vitals tele labs imaging I/O ED provider note, admission H&P  Principal Problem:   Acute metabolic encephalopathy    ASSESSMENT AND PLAN:  Rommie Dunn is a 63 y.o. male  with a past medical history of alcohol use, liver disease who presented to the ED on 07/07/2024 for altered mental status and jaundice. Echo this admission with concern for SBE of anterior leaflet of aortic valve. Cardiology was consulted for further evaluation.   # Possible endocarditis # Acute toxic metabolic encephalopathy # Alcoholic hepatitis # Severe thrombocytopenia Patient initially presented for altered mental status, jaundice.  Treated for alcoholic hepatitis with sepsis, possible colitis.  Severely thrombocytopenic since admission.  Echo done 07/08/2024 with possible vegetation of aortic valve. - Given patient is not a candidate for TEE given persistent thrombocytopenia, will order cardiac MRI for additional evaluation of aortic valve. - Further management of alcoholic hepatitis, metabolic encephalopathy, anemia and thrombocytopenia as per primary team. -Mildly elevated and flat trending troponins most consistent with demand/supply mismatch and not ACS in the setting of above illness.  This patient's plan of care was discussed and created with Dr. Custovic and he is in agreement.  Signed: Danita Bloch, PA-C  07/09/2024, 9:55 AM [facility] Cardiology

## 2024-07-09 NOTE — Consult Note (Signed)
 PHARMACY CONSULT NOTE - ELECTROLYTES  Pharmacy Consult for Electrolyte Monitoring and Replacement   Recent Labs: Height: 5' 9 (175.3 cm) Weight: 58.7 kg (129 lb 6.6 oz) IBW/kg (Calculated) : 70.7 Estimated Creatinine Clearance: 35 mL/min (A) (by C-G formula based on SCr of 1.77 mg/dL (H)). Potassium (mmol/L)  Date Value  07/09/2024 3.1 (L)   Magnesium (mg/dL)  Date Value  89/72/7974 2.6 (H)   Calcium (mg/dL)  Date Value  89/72/7974 7.9 (L)   Albumin (g/dL)  Date Value  89/72/7974 1.9 (L)   Phosphorus (mg/dL)  Date Value  89/72/7974 3.1   Sodium (mmol/L)  Date Value  07/09/2024 134 (L)   Corrected Ca: 9.24 mg/dL  Assessment  Sergio Curry is a 63 y.o. male presenting with altered mental status and jaundice. PMH significant for seizures and liver disease. Pharmacy has been consulted to monitor and replace electrolytes.  Goal of Therapy: Electrolytes WNL  Plan:  KCl 10mEq IV x 3  Check BMP, Mg, Phos with AM labs  Thank you for allowing pharmacy to be a part of this patient's care.  Adriana JONETTA Bolster, PharmD Clinical Pharmacist 07/09/2024 7:10 AM

## 2024-07-09 NOTE — Progress Notes (Addendum)
 NAME:  Sergio Curry, MRN:  [id number], DOB:  [birth year], LOS: 2 ADMISSION DATE:  07/07/2024, CONSULTATION DATE: 07/07/2024 REFERRING MD: Dr. Jacolyn, CHIEF COMPLAINT: Altered Mental Status  History of Present Illness:  This is a 63 yo male who presented to [facility] ER on 10/25 via EMS with altered mental status and jaundice.  Per ED notes EMS reported when they arrived on the scene pt hypothermic temp 93.9 F, therefore they administered 500 ml NS bolus.    ED Course  Upon arrival to the ER pt reported he does have liver disease and drinks a few beers a day. Initial vital signs were: temp 94.1 F/sbp 97/hr 96/rr 20/O2 sats 94% on RA.  Significant lab results were: Na+132/K+ 2.9/chloride 96/CO2 18/BUN 37/creatinine 1.68/calcium 8.4/anion 8.4/alk phos 163/albumin 1.8/AST 128/ammonia 46/total bilirubin 8.3/troponin 171/lactic >9.0/wbc 15.3/hgb 6.6/platelet count 6/PT 20.7/INR 1.7/alcohol level <15.  Pt denies bloody stools, melena, or vomiting blood.  He denies taking blood thinners.  CT Head negative.  While in the ER pt received 2.5L LR bolus and flagyl/vancomycin ordered.  CT Abd/Pelvis revealed hepatomegaly and advanced hepatic steatosis, possible colitis, and cholelithiasis without gall bladder inflammation.  PCCM team contacted for ICU admission.    Pertinent  Medical History  Alcoholic Withdrawal Seizures   Micro Data:   MRSA PCR 10/25>>negative  Resp panel by RT-PCR 10/25>>negative  RVP 10/25>>negative  Blood 10/25>>staph epi in 1 of 4 bottles likely contaminant   Anti-infectives (From admission, onward)    Start     Dose/Rate Route Frequency Ordered Stop   07/07/24 2200  piperacillin-tazobactam (ZOSYN) IVPB 3.375 g        3.375 g 12.5 mL/hr over 240 Minutes Intravenous Every 8 hours 07/07/24 1505     07/07/24 2100  cefTRIAXone (ROCEPHIN) 2 g in sodium chloride  0.9 % 100 mL IVPB  Status:  Discontinued        2 g 200 mL/hr over 30 Minutes Intravenous Every 24 hours 07/07/24 1439  07/07/24 1453   07/07/24 1330  ceFEPIme (MAXIPIME) 2 g in sodium chloride  0.9 % 100 mL IVPB        2 g 200 mL/hr over 30 Minutes Intravenous  Once 07/07/24 1325 07/07/24 1438   07/07/24 1330  metroNIDAZOLE (FLAGYL) IVPB 500 mg        500 mg 100 mL/hr over 60 Minutes Intravenous  Once 07/07/24 1325 07/07/24 1810   07/07/24 1330  vancomycin (VANCOCIN) IVPB 1000 mg/200 mL premix        1,000 mg 200 mL/hr over 60 Minutes Intravenous  Once 07/07/24 1325 07/07/24 1910      Significant Hospital Events: Including procedures, antibiotic start and stop dates in addition to other pertinent events   10/25: Admitted with acute toxic metabolic encephalopathy, sepsis, possible colitis, alcoholic hepatitis, severe lactic acidosis, macrocytic anemia, and pancytopenia.  Pt received 2 units of pRBC's and 2 units of platelets  10/26: Pt showed signs of ETOH withdrawal overnight requiring ativan  per CIWA protocol once.  CBC stable at this time.  GI consulted and will see pt today  10/27: Pt is on 5L [place]. Remains in withdrawal and not oriented. Platelets 12 this am. Requiring prn Ativan .  Interim History / Subjective:  As outlined above under significant events    Objective    Blood pressure (!) 116/42, pulse 88, temperature (!) 97.4 F (36.3 C), temperature source Oral, resp. rate (!) 24, height 5' 9 (1.753 m), weight 58.7 kg, SpO2 96%.  Intake/Output Summary (Last 24 hours) at 07/09/2024 0749 Last data filed at 07/09/2024 0645 Gross per 24 hour  Intake 2436.35 ml  Output 100 ml  Net 2336.35 ml   Filed Weights   07/07/24 1517 07/08/24 0500 07/09/24 0500  Weight: 64 kg 58.7 kg 58.7 kg   Examination: General: Acute on chronically-ill appearing male, NAD on 5L [place] HENT: Supple, no JVD, scleral icterus  Lungs: trace crackles at bases, otherwise clear throughout, even, non labored  Cardiovascular: Sinus rhythm, s1s2, no r/g, no m/r/g Abdomen: soft, non-distended, non-tender, no  guarding/rebound, bowel sounds x 4 Extremities: warm, dry, intact, moves all extremities, radial and distal pulses 2+, no edema Neuro: alert but slow to respond & oriented x 3, withdraws from painful stimulation, PERRL  Skin: Severely jaundiced and generalized scattered petechiae  GU: External catheter draining tea colored urine   Resolved problem list   Assessment and Plan   #Acute toxic metabolic encephalopathy  #ETOH Abuse  #Hyperammonemia  - Continue CIWA protocol; ativan  prn - High dose thiamine x 4 days, followed by 100mg  daily and folic acid daily - Correct metabolic derangements as indicated - Maintain sleep/wake cycle - Continue Lactulose twice daily   #Elevated troponin's suspect secondary to demand ischemia #Suspected Endocarditis  - Continuous cardiac monitoring  - Vasopressors to maintain MAP goal >65 ~ not currently requiring - Troponin peak: 171 - ECHO: EF 45-50%; possible SBE of anterior leaflet, global hypokinesis of LV; TEE recommended - Not currently a TEE candidate per cardiology d/t thrombocytopenia - Cardiac MRI   #Acute kidney injury suspect secondary to ATN  #Anion gap metabolic acidosis ~ Resolved #Severe lactic acidosis ~ Resolved #Hyponatremia  #Hypokalemia  - Strict I/O - Trend BMP and renal function; Creatinine 1.77 this am - Trend lactic acid; most recent 1.3 - Electrolyte replacement protocol as indicated - Pharmacy to assist with replacement  - Potassium 3.1 this am - Avoid nephrotoxins as able - Ensure renal perfusion - Per GI, consider albumin challenge and/or start octreotide bolus and infusion if UOP low and Cr rising   #Alcoholic Hepatitis  #Elevated alk phos  #Hyperbilirubinemia CT Abd Pelvis W Contrast 10/25: Hepatomegaly and advanced hepatic steatosis. There may be subtle capsular nodularity, although motion obscures assessment. Recommend correlation with any clinical or laboratory findings of cirrhosis. Mild wall thickening  about the ascending and hepatic flexure of the colon, suspicious for colitis. Cholelithiasis without gallbladder inflammation. Absent renal excretion on delayed phase imaging, often seen with renal dysfunction. Aortic Atherosclerosis US  ABD LTD RUG W Liver Doppler 10/25: Limited evaluation. The visualized portal vasculature is patent with antegrade flow. Diffusely coarsened and echogenic hepatic parenchyma as could be seen with hepatic steatosis or acute hepatitis. No evidence of biliary ductal dilation - Trend hepatic function panel - Tbili 11.0, previously 8.3 - Acute hepatitis panel: non-reactive - Per GI, will need to have platelets >50K prior to endoscopic evaluation   #Sepsis  #Suspected Endocarditis #Possible Colitis - Trend WBC and monitor fever curve - WBC 18.3, remains afebrile - Repeat blood cultures - ABX: Zosyn - Continue Zosyn pending culture and sensitivies   #Macrocytic anemia  #Anemia without signs of bleeding  #Severe thrombocytopenia due to Alcoholic Hepatitis  #Elevated coags  - Trend CBC and monitor H&H, Coags - Transfuse if Hgb < 7 or platelets <10K or s/sx of bleeding - Previously received 2 units pRBC and 2 packs of platelets - Platelets 12 this am; replace - Monitor for s/sx of bleeding - DVT prophylaxis: SCDs (  avoid chemical VTE given anemia and coag results) - Vitamin B12 and folic acid levels within normal limits  - PPI: Protonix 40mg  Q12h - Blood smear pending - Heme consult   #Endo - ICU hypo/hyperglycemic protocol - CBG Q4h - Glucose goal: 140-180   #Protein Malnutrition  - Diet: NPO - Albumin x 1 completed yesterday - Dietician consult   Labs   CBC: Recent Labs  Lab 07/07/24 1210 07/08/24 0426 07/09/24 0555  WBC 15.3* 17.9* 18.3*  NEUTROABS 11.1* 12.3*  --   HGB 6.6* 7.9* 8.9*  HCT 19.8* 22.6* 26.0*  MCV 111.2* 95.8 98.1  PLT 6* 25* 12*    Basic Metabolic Panel: Recent Labs  Lab 07/07/24 1210 07/08/24 0426 07/08/24 2003  07/09/24 0555  NA 132* 133*  --  134*  K 2.9* 2.9* 3.2* 3.1*  CL 96* 96*  --  100  CO2 18* 23  --  22  GLUCOSE 79 128*  --  105*  BUN 37* 40*  --  55*  CREATININE 1.68* 1.42*  --  1.77*  CALCIUM 8.4* 7.4*  --  7.9*  MG 1.6* 2.3  --  2.6*  PHOS 1.4* 3.9  --  3.1   GFR: Estimated Creatinine Clearance: 35 mL/min (A) (by C-G formula based on SCr of 1.77 mg/dL (H)). Recent Labs  Lab 07/07/24 1210 07/07/24 1433 07/07/24 1957 07/08/24 0426 07/08/24 0829 07/08/24 1110 07/09/24 0555  WBC 15.3*  --   --  17.9*  --   --  18.3*  LATICACIDVEN >9.0* 8.8* 6.4*  --  1.8 1.3  --     Liver Function Tests: Recent Labs  Lab 07/07/24 1210 07/08/24 0426 07/09/24 0555  AST 128* 100* 103*  ALT 30 25 27   ALKPHOS 163* 118 132*  BILITOT 8.3* 8.3* 11.0*  PROT 6.5 5.4* 6.3*  ALBUMIN 1.8* 1.7* 1.9*   Recent Labs  Lab 07/07/24 1210  LIPASE 16   Recent Labs  Lab 07/07/24 1210  AMMONIA 46*    ABG    Component Value Date/Time   HCO3 16.9 (L) 07/07/2024 1600   ACIDBASEDEF 7.9 (H) 07/07/2024 1600   O2SAT 45.8 07/07/2024 1600     Coagulation Profile: Recent Labs  Lab 07/07/24 1210 07/08/24 1110  INR 1.7* 1.5*    Cardiac Enzymes: No results for input(s): CKTOTAL, CKMB, CKMBINDEX, TROPONINI in the last 168 hours.  HbA1C: No results found for: HGBA1C  CBG: Recent Labs  Lab 07/08/24 1537 07/08/24 1930 07/08/24 2313 07/09/24 0308 07/09/24 0733  GLUCAP 106* 101* 98 107* 93    Review of Systems: Positives in BOLD   Review of Systems  Constitutional:  Negative for chills and fever.  Respiratory:  Negative for cough, shortness of breath and wheezing.   Cardiovascular:  Negative for chest pain and palpitations.  Gastrointestinal:  Negative for abdominal pain, heartburn, nausea and vomiting.  Genitourinary:  Negative for dysuria.  Skin:  Negative for itching.  Neurological:  Negative for dizziness and headaches.     Past Medical History:  He,  has a past  medical history of Seizures (HCC).   Surgical History:  No past surgical history on file.   Social History:   reports that he has quit smoking. His smoking use included cigarettes. He has never used smokeless tobacco. He reports current alcohol use of about 7.0 standard drinks of alcohol per week. He reports current drug use. Drug: Marijuana.   Family History:  His family history is not on file.  Allergies No Known Allergies   Home Medications  Prior to Admission medications   Medication Sig Start Date End Date Taking? Authorizing Provider  chlordiazePOXIDE  (LIBRIUM ) 25 MG capsule Take 1 capsule 5 times a day on day 1, then decrease by one capsule daily until gone 01/06/16   Trudy Dorn BRAVO, MD  ciprofloxacin -dexamethasone  (CIPRODEX ) OTIC suspension Place 4 drops into the right ear 2 (two) times daily. 05/22/20   LampteyAleene KIDD, MD  cloNIDine  (CATAPRES  - DOSED IN MG/24 HR) 0.1 mg/24hr patch Place 1 patch (0.1 mg total) onto the skin every 7 (seven) days. 01/06/16   Trudy Dorn BRAVO, MD     Critical care time: 45 minutes      Robet Kim, PA-C [facility] PCCM Team Contact Info: [phone]

## 2024-07-10 ENCOUNTER — Inpatient Hospital Stay: Payer: MEDICAID

## 2024-07-10 DIAGNOSIS — K700 Alcoholic fatty liver: Secondary | ICD-10-CM

## 2024-07-10 LAB — COMPREHENSIVE METABOLIC PANEL WITH GFR
ALT: 25 U/L (ref 0–44)
AST: 94 U/L — ABNORMAL HIGH (ref 15–41)
Albumin: 1.6 g/dL — ABNORMAL LOW (ref 3.5–5.0)
Alkaline Phosphatase: 119 U/L (ref 38–126)
Anion gap: 16 — ABNORMAL HIGH (ref 5–15)
BUN: 56 mg/dL — ABNORMAL HIGH (ref 8–23)
CO2: 21 mmol/L — ABNORMAL LOW (ref 22–32)
Calcium: 7.7 mg/dL — ABNORMAL LOW (ref 8.9–10.3)
Chloride: 101 mmol/L (ref 98–111)
Creatinine, Ser: 1.8 mg/dL — ABNORMAL HIGH (ref 0.61–1.24)
GFR, Estimated: 42 mL/min — ABNORMAL LOW (ref >60)
Glucose, Bld: 106 mg/dL — ABNORMAL HIGH (ref 70–99)
Potassium: 3.5 mmol/L (ref 3.5–5.1)
Sodium: 138 mmol/L (ref 135–145)
Total Bilirubin: 10.1 mg/dL — ABNORMAL HIGH (ref 0.0–1.2)
Total Protein: 6 g/dL — ABNORMAL LOW (ref 6.5–8.1)

## 2024-07-10 LAB — PREPARE PLATELET PHERESIS
Unit division: 0
Unit division: 0

## 2024-07-10 LAB — BPAM PLATELET PHERESIS
Blood Product Expiration Date: 202510282359
Blood Product Expiration Date: 202510282359
ISSUE DATE / TIME: 202510251630
ISSUE DATE / TIME: 202510251844
Unit Type and Rh: 6200
Unit Type and Rh: 6200

## 2024-07-10 LAB — CULTURE, BLOOD (ROUTINE X 2)

## 2024-07-10 LAB — BLOOD GAS, ARTERIAL
Acid-base deficit: 1.7 mmol/L (ref 0.0–2.0)
Acid-base deficit: 4.8 mmol/L — ABNORMAL HIGH (ref 0.0–2.0)
Bicarbonate: 21.9 mmol/L (ref 20.0–28.0)
Bicarbonate: 20.5 mmol/L (ref 20.0–28.0)
FIO2: 65 %
FIO2: 60 %
MECHVT: 450 mL
Mechanical Rate: 20
O2 Content: 55 L/min
O2 Saturation: 95.7 %
O2 Saturation: 99.7 %
PEEP: 10 cmH2O
Patient temperature: 37
Patient temperature: 37
pCO2 arterial: 33 mmHg (ref 32–48)
pCO2 arterial: 38 mmHg (ref 32–48)
pH, Arterial: 7.43 (ref 7.35–7.45)
pH, Arterial: 7.34 — ABNORMAL LOW (ref 7.35–7.45)
pO2, Arterial: 69 mmHg — ABNORMAL LOW (ref 83–108)
pO2, Arterial: 127 mmHg — ABNORMAL HIGH (ref 83–108)

## 2024-07-10 LAB — CBC
HCT: 24.1 % — ABNORMAL LOW (ref 39.0–52.0)
Hemoglobin: 8.2 g/dL — ABNORMAL LOW (ref 13.0–17.0)
MCH: 33.5 pg (ref 26.0–34.0)
MCHC: 34 g/dL (ref 30.0–36.0)
MCV: 98.4 fL (ref 80.0–100.0)
Platelets: 12 K/uL — CL (ref 150–400)
RBC: 2.45 MIL/uL — ABNORMAL LOW (ref 4.22–5.81)
RDW: 20.2 % — ABNORMAL HIGH (ref 11.5–15.5)
WBC: 20.6 K/uL — ABNORMAL HIGH (ref 4.0–10.5)
nRBC: 0.2 % (ref 0.0–0.2)

## 2024-07-10 LAB — GLUCOSE, CAPILLARY
Glucose-Capillary: 99 mg/dL (ref 70–99)
Glucose-Capillary: 94 mg/dL (ref 70–99)
Glucose-Capillary: 99 mg/dL (ref 70–99)
Glucose-Capillary: 120 mg/dL — ABNORMAL HIGH (ref 70–99)
Glucose-Capillary: 146 mg/dL — ABNORMAL HIGH (ref 70–99)
Glucose-Capillary: 129 mg/dL — ABNORMAL HIGH (ref 70–99)

## 2024-07-10 LAB — COOXEMETRY PANEL
Carboxyhemoglobin: 1.4 % (ref 0.5–1.5)
Methemoglobin: 0.9 % (ref 0.0–1.5)
O2 Saturation: 60.5 %
Total hemoglobin: 8.2 g/dL — ABNORMAL LOW (ref 12.0–16.0)
Total oxygen content: 59.1 %

## 2024-07-10 LAB — MAGNESIUM: Magnesium: 2.3 mg/dL (ref 1.7–2.4)

## 2024-07-10 LAB — LACTIC ACID, PLASMA: Lactic Acid, Venous: 1.4 mmol/L (ref 0.5–1.9)

## 2024-07-10 LAB — PHOSPHORUS: Phosphorus: 3.4 mg/dL (ref 2.5–4.6)

## 2024-07-10 LAB — HAPTOGLOBIN: Haptoglobin: <10 mg/dL — ABNORMAL LOW (ref 32–363)

## 2024-07-10 LAB — IMMATURE PLATELET FRACTION: Immature Platelet Fraction: 13.9 % — ABNORMAL HIGH (ref 1.2–8.6)

## 2024-07-10 LAB — AMMONIA: Ammonia: 46 umol/L — ABNORMAL HIGH (ref 9–35)

## 2024-07-10 MED ORDER — SODIUM CHLORIDE 0.9 % IV SOLN
2.0000 g | INTRAVENOUS | Status: DC
  Administered 2024-07-10 – 2024-07-14 (×4): 2 g via INTRAVENOUS
  Filled 2024-07-10 (×4): qty 20

## 2024-07-10 MED ORDER — NOREPINEPHRINE 4 MG/250ML-% IV SOLN: 0.0000 ug/min | INTRAVENOUS | Status: DC

## 2024-07-10 MED ORDER — SODIUM CHLORIDE 0.9% IV SOLUTION: Freq: Once | INTRAVENOUS | Status: AC

## 2024-07-10 MED ORDER — ROCURONIUM BROMIDE 10 MG/ML (PF) SYRINGE
50.0000 mg | PREFILLED_SYRINGE | Freq: Once | INTRAVENOUS | Status: AC
  Administered 2024-07-10: 50 mg via INTRAVENOUS
  Filled 2024-07-10: qty 10

## 2024-07-10 MED ORDER — POLYETHYLENE GLYCOL 3350 17 G PO PACK
17.0000 g | PACK | Freq: Every day | ORAL | Status: DC
  Administered 2024-07-11 – 2024-07-13 (×3): 17 g
  Filled 2024-07-10 (×3): qty 1

## 2024-07-10 MED ORDER — SODIUM CHLORIDE 0.9% FLUSH
10.0000 mL | Freq: Two times a day (BID) | INTRAVENOUS | Status: DC
  Administered 2024-07-10: 10 mL
  Administered 2024-07-10: 30 mL
  Administered 2024-07-11: 10 mL
  Administered 2024-07-11: 30 mL
  Administered 2024-07-12: 40 mL
  Administered 2024-07-12 – 2024-07-13 (×3): 10 mL

## 2024-07-10 MED ORDER — FENTANYL CITRATE (PF) 50 MCG/ML IJ SOSY
50.0000 ug | PREFILLED_SYRINGE | INTRAMUSCULAR | Status: AC | PRN
  Administered 2024-07-12 – 2024-07-14 (×3): 50 ug via INTRAVENOUS
  Filled 2024-07-10 (×3): qty 1

## 2024-07-10 MED ORDER — DOCUSATE SODIUM 50 MG/5ML PO LIQD
100.0000 mg | Freq: Two times a day (BID) | ORAL | Status: DC
  Administered 2024-07-11 – 2024-07-13 (×5): 100 mg
  Filled 2024-07-10 (×5): qty 10

## 2024-07-10 MED ORDER — POTASSIUM CHLORIDE 10 MEQ/100ML IV SOLN
10.0000 meq | INTRAVENOUS | Status: AC
  Administered 2024-07-10 (×4): 10 meq via INTRAVENOUS
  Filled 2024-07-10 (×4): qty 100

## 2024-07-10 MED ORDER — DOBUTAMINE-DEXTROSE 4-5 MG/ML-% IV SOLN
2.5000 ug/kg/min | INTRAVENOUS | Status: DC
  Administered 2024-07-10: 2.5 ug/kg/min via INTRAVENOUS
  Filled 2024-07-10: qty 250

## 2024-07-10 MED ORDER — FUROSEMIDE 10 MG/ML IJ SOLN
10.0000 mg/h | INTRAVENOUS | Status: DC
  Filled 2024-07-10: qty 20

## 2024-07-10 MED ORDER — SODIUM CHLORIDE 0.9 % IV SOLN
250.0000 mL | INTRAVENOUS | Status: AC
  Administered 2024-07-10: 250 mL via INTRAVENOUS

## 2024-07-10 MED ORDER — ETOMIDATE 2 MG/ML IV SOLN
20.0000 mg | Freq: Once | INTRAVENOUS | Status: AC
  Administered 2024-07-10: 20 mg via INTRAVENOUS
  Filled 2024-07-10: qty 10

## 2024-07-10 MED ORDER — NOREPINEPHRINE 16 MG/250ML-% IV SOLN
0.0000 ug/min | INTRAVENOUS | Status: DC
  Administered 2024-07-10: 18 ug/min via INTRAVENOUS
  Administered 2024-07-11: 20 ug/min via INTRAVENOUS
  Administered 2024-07-11: 16 ug/min via INTRAVENOUS
  Administered 2024-07-12 – 2024-07-13 (×2): 12 ug/min via INTRAVENOUS
  Administered 2024-07-14: 20 ug/min via INTRAVENOUS
  Filled 2024-07-10 (×6): qty 250

## 2024-07-10 MED ORDER — DOXYCYCLINE HYCLATE 100 MG IV SOLR
100.0000 mg | Freq: Two times a day (BID) | INTRAVENOUS | Status: DC
  Administered 2024-07-10: 100 mg via INTRAVENOUS
  Filled 2024-07-10 (×2): qty 100

## 2024-07-10 MED ORDER — SODIUM CHLORIDE 0.9% FLUSH: 10.0000 mL | INTRAVENOUS | Status: DC | PRN

## 2024-07-10 MED ORDER — ALBUMIN HUMAN 25 % IV SOLN
25.0000 g | Freq: Two times a day (BID) | INTRAVENOUS | Status: AC
  Administered 2024-07-10: 12.5 g via INTRAVENOUS
  Administered 2024-07-10: 25 g via INTRAVENOUS
  Filled 2024-07-10 (×2): qty 100

## 2024-07-10 MED ORDER — VANCOMYCIN HCL IN DEXTROSE 1-5 GM/200ML-% IV SOLN
1000.0000 mg | INTRAVENOUS | Status: DC
  Filled 2024-07-10: qty 200

## 2024-07-10 MED ORDER — VANCOMYCIN HCL 1500 MG/300ML IV SOLN
1500.0000 mg | Freq: Once | INTRAVENOUS | Status: AC
  Administered 2024-07-10: 1500 mg via INTRAVENOUS
  Filled 2024-07-10: qty 300

## 2024-07-10 MED ORDER — VASOPRESSIN 20 UNITS/100 ML INFUSION FOR SHOCK
0.0000 [IU]/min | INTRAVENOUS | Status: DC
  Administered 2024-07-10: 0.01 [IU]/min via INTRAVENOUS
  Administered 2024-07-11 – 2024-07-13 (×4): 0.03 [IU]/min via INTRAVENOUS
  Administered 2024-07-13 – 2024-07-14 (×2): 0.02 [IU]/min via INTRAVENOUS
  Filled 2024-07-10 (×8): qty 100

## 2024-07-10 MED ORDER — FENTANYL CITRATE (PF) 50 MCG/ML IJ SOSY
50.0000 ug | PREFILLED_SYRINGE | INTRAMUSCULAR | Status: DC | PRN
  Administered 2024-07-14 (×2): 50 ug via INTRAVENOUS
  Filled 2024-07-10 (×2): qty 1

## 2024-07-10 MED ORDER — PROPOFOL 1000 MG/100ML IV EMUL
0.0000 ug/kg/min | INTRAVENOUS | Status: DC
  Administered 2024-07-10: 5 ug/kg/min via INTRAVENOUS
  Administered 2024-07-11 – 2024-07-13 (×5): 15 ug/kg/min via INTRAVENOUS
  Filled 2024-07-10 (×7): qty 100

## 2024-07-10 MED ORDER — METRONIDAZOLE 500 MG/100ML IV SOLN
500.0000 mg | Freq: Two times a day (BID) | INTRAVENOUS | Status: DC
  Administered 2024-07-10 – 2024-07-13 (×7): 500 mg via INTRAVENOUS
  Filled 2024-07-10 (×8): qty 100

## 2024-07-10 MED ORDER — FUROSEMIDE 10 MG/ML IJ SOLN
80.0000 mg | Freq: Once | INTRAMUSCULAR | Status: AC
  Administered 2024-07-10: 80 mg via INTRAVENOUS
  Filled 2024-07-10: qty 8

## 2024-07-10 MED ORDER — MORPHINE SULFATE (PF) 2 MG/ML IV SOLN
2.0000 mg | Freq: Once | INTRAVENOUS | Status: AC
  Administered 2024-07-10: 2 mg via INTRAVENOUS
  Filled 2024-07-10: qty 1

## 2024-07-10 MED ORDER — NOREPINEPHRINE 4 MG/250ML-% IV SOLN
0.0000 ug/min | INTRAVENOUS | Status: DC
  Administered 2024-07-10: 13 ug/min via INTRAVENOUS
  Administered 2024-07-10 (×2): 5 ug/min via INTRAVENOUS
  Filled 2024-07-10 (×2): qty 250

## 2024-07-10 MED ORDER — MIDAZOLAM HCL (PF) 2 MG/2ML IJ SOLN: 1.0000 mg | INTRAMUSCULAR | Status: DC | PRN

## 2024-07-10 MED ORDER — LACTULOSE 10 GM/15ML PO SOLN
20.0000 g | Freq: Two times a day (BID) | ORAL | Status: DC
  Administered 2024-07-10 – 2024-07-13 (×7): 20 g via NASOGASTRIC
  Filled 2024-07-10 (×7): qty 30

## 2024-07-10 NOTE — Progress Notes (Signed)
 [facility] CLINIC CARDIOLOGY PROGRESS NOTE       Patient ID: Sergio Curry MRN: [id number] DOB/AGE: 10/01/1960 63 y.o.  Admit date: 07/07/2024 Referring Physician Lonell Moose, NP Primary Physician Patient, No Pcp Per  Primary Cardiologist None Reason for Consultation Possible SBE  HPI: Sergio Curry is a 63 y.o. male  with a past medical history of alcohol use, liver disease who presented to the ED on 07/07/2024 for altered mental status and jaundice. Echo this admission with concern for SBE of anterior leaflet of aortic valve. Cardiology was consulted for further evaluation.   Interval history: -Patient seen and examined this AM, now on BiPAP. -Platelets remain low at 12 today.  -cMRI done yesterday with poor image quality, could not rule out endocarditis. Did note aortic valve prolapse with moderate AR.   Review of systems complete and found to be negative unless listed above    Past Medical History:  Diagnosis Date   Seizures (HCC)     No past surgical history on file.  No medications prior to admission.   Social History   Socioeconomic History   Marital status: Single    Spouse name: Not on file   Number of children: Not on file   Years of education: Not on file   Highest education level: Not on file  Occupational History   Not on file  Tobacco Use   Smoking status: Former    Types: Cigarettes   Smokeless tobacco: Never  Substance and Sexual Activity   Alcohol use: Yes    Alcohol/week: 7.0 standard drinks of alcohol    Types: 4 Cans of beer, 3 Shots of liquor per week   Drug use: Yes    Types: Marijuana   Sexual activity: Not Currently    Birth control/protection: None  Other Topics Concern   Not on file  Social History Narrative   Not on file   Social Drivers of Health   Financial Resource Strain: Not on file  Food Insecurity: No Food Insecurity (07/07/2024)   Hunger Vital Sign    Worried About Running Out of Food in the Last Year: Never true     Ran Out of Food in the Last Year: Never true  Transportation Needs: No Transportation Needs (07/07/2024)   PRAPARE - Administrator, Civil Service (Medical): No    Lack of Transportation (Non-Medical): No  Physical Activity: Not on file  Stress: Not on file  Social Connections: Not on file  Intimate Partner Violence: Not At Risk (07/07/2024)   Humiliation, Afraid, Rape, and Kick questionnaire    Fear of Current or Ex-Partner: No    Emotionally Abused: No    Physically Abused: No    Sexually Abused: No    No family history on file.   Vitals:   07/10/24 0600 07/10/24 0630 07/10/24 0700 07/10/24 0718  BP: (!) 112/40 (!) 99/40 101/64   Pulse: 96 87 92   Resp: (!) 26 (!) 33 (!) 22   Temp:      TempSrc:      SpO2: 90% 94% 90% 92%  Weight:      Height:        PHYSICAL EXAM General: Chronically ill appearing male, well nourished, in no acute distress. HEENT: Normocephalic and atraumatic. Neck: No JVD.  Lungs: Normal respiratory effort on supplemental O2. Clear bilaterally to auscultation. No wheezes, crackles, rhonchi.  Heart: HRRR. Normal S1 and S2 without gallops or murmurs.  Abdomen: Non-distended appearing.  Msk: Normal strength and  tone for age. Extremities: Warm and well perfused. No clubbing, cyanosis. No edema.  Neuro: Alert and oriented X 3. Psych: Answers questions appropriately.   Labs: Basic Metabolic Panel: Recent Labs    07/09/24 0555 07/10/24 0313  NA 134* 138  K 3.1* 3.5  CL 100 101  CO2 22 21*  GLUCOSE 105* 106*  BUN 55* 56*  CREATININE 1.77* 1.80*  CALCIUM 7.9* 7.7*  MG 2.6* 2.3  PHOS 3.1 3.4   Liver Function Tests: Recent Labs    07/09/24 0555 07/10/24 0313  AST 103* 94*  ALT 27 25  ALKPHOS 132* 119  BILITOT 11.0* 10.1*  PROT 6.3* 6.0*  ALBUMIN 1.9* 1.6*   Recent Labs    07/07/24 1210  LIPASE 16   CBC: Recent Labs    07/08/24 0426 07/09/24 0555 07/09/24 1052 07/10/24 0313  WBC 17.9*   < > 17.8* 20.6*  NEUTROABS  12.3*  --  13.4*  --   HGB 7.9*   < > 8.3* 8.2*  HCT 22.6*   < > 24.6* 24.1*  MCV 95.8   < > 98.8 98.4  PLT 25*   < > 13* 12*   < > = values in this interval not displayed.   Cardiac Enzymes: Recent Labs    07/07/24 1210 07/07/24 1340  TROPONINIHS 171* 154*   BNP: Recent Labs    07/09/24 1841  BNP 2,360.8*   D-Dimer: Recent Labs    07/09/24 1052  DDIMER 2.68*   Hemoglobin A1C: No results for input(s): HGBA1C in the last 72 hours. Fasting Lipid Panel: No results for input(s): CHOL, HDL, LDLCALC, TRIG, CHOLHDL, LDLDIRECT in the last 72 hours. Thyroid Function Tests: No results for input(s): TSH, T4TOTAL, T3FREE, THYROIDAB in the last 72 hours.  Invalid input(s): FREET3 Anemia Panel: Recent Labs    07/07/24 1338 07/07/24 1559  VITAMINB12  --  2,972*  FOLATE 8.1  --      Radiology: [facility] Chest Port 1 View Result Date: 07/10/2024 EXAM: 1 VIEW(S) XRAY OF THE CHEST 07/10/2024 03:26:54 AM COMPARISON: Chest x-ray dated 11/10/2023. CLINICAL HISTORY: 200808 Hypoxia FINDINGS: LUNGS AND PLEURA: Interval increase in bilateral perihilar, right greater than left, streaky consolidative airspace opacity. Left upper lobe patchy airspace opacities. Blunting of the right costophrenic angle with likely underlying trace pleural effusion. Left costophrenic angle is collimated off view. No pulmonary edema. No pneumothorax. HEART AND MEDIASTINUM: No acute abnormality of the cardiac and mediastinal silhouettes. BONES AND SOFT TISSUES: No acute osseous abnormality. IMPRESSION: 1. Interval increase in bilateral perihilar, right greater than left, consolidative and patchy airspace opacities, most consistent with multifocal pneumonia or aspiration pneumonitis. 2. Blunting of the right costophrenic angle with likely trace pleural effusion. Electronically signed by: Morgane Naveau MD 07/10/2024 03:47 AM EDT RP Workstation: [id number]   DG Chest Port 1 View Result Date:  07/09/2024 EXAM: 1 VIEW(S) XRAY OF THE CHEST 07/09/2024 04:45:00 PM COMPARISON: Chest x-ray 07/07/2024. CLINICAL HISTORY: 858128 Dyspnea [id number]. Dyspnea FINDINGS: LUNGS AND PLEURA: There is new bilateral multifocal airspace consolidation, right greater than left. This is predominantly central in location. No pulmonary edema. No pleural effusion. No pneumothorax. HEART AND MEDIASTINUM: No acute abnormality of the cardiac and mediastinal silhouettes. BONES AND SOFT TISSUES: There are multiple healed left-sided rib fractures. IMPRESSION: 1. New bilateral multifocal airspace consolidation, right greater than left, predominantly central in location. Electronically signed by: Greig Pique MD 07/09/2024 09:42 PM EDT RP Workstation: [id number]   MR CARDIAC MORPHOLOGY W WO CONTRAST Result Date: 07/09/2024  CLINICAL DATA:  Aortic regurgitation, possible aortic vegetation EXAM: CARDIAC MRI TECHNIQUE: The patient was scanned on a 1.5 Tesla Siemens magnet. A dedicated cardiac coil was used. Functional imaging was done using Fiesta sequences. 2,3, and 4 chamber views were done to assess for RWMA's. Modified Simpson's rule using a short axis stack was used to calculate an ejection fraction on a dedicated work [employer]. The patient received 8 cc of Gadavist. After 10 minutes inversion recovery sequences were used to assess for infiltration and scar Curry. Velocity flow mapping performed in the ascending aorta and main pulmonary artery. CONTRAST:  8 cc  of Gadavist FINDINGS: 1. Normal left ventricular size and thickness. Mildly reduced LV systolic function (LVEF = 45%). There is global hypokinesis. There is no late gadolinium enhancement in the left ventricular myocardium. LVEDV: 172 ml LVESV: 94 ml SV: 77 ml CO: 4 L/min Myocardial mass: 90 g 2. Normal right ventricular size, thickness and low normal systolic function. There are no regional wall motion abnormalities. 3.  Normal left and right atrial size.  4. Normal size of the aortic root, ascending aorta and pulmonary artery. 5. Aortic valve prolapse, moderate aortic valve regurgitation. Regurgitant volume 51 ml, regurgitant fraction 53%. 6.  Normal pericardium.  No pericardial effusion. IMPRESSION: 1.  Mildly reduced LV systolic function.  LVEF 45% 2.  No LGE or scar. 3.  Aortic valve prolapse and moderate aortic regurgitation. 4.  Poor image quality, cannot rule out aortic valve vegetation. 5.  Normal RV systolic function. Electronically Signed   By: Redell Cave M.D.   On: 07/09/2024 17:11   MR CARDIAC VELOCITY FLOW MAP Result Date: 07/09/2024 CLINICAL DATA:  Aortic regurgitation, possible aortic vegetation EXAM: CARDIAC MRI TECHNIQUE: The patient was scanned on a 1.5 Tesla Siemens magnet. A dedicated cardiac coil was used. Functional imaging was done using Fiesta sequences. 2,3, and 4 chamber views were done to assess for RWMA's. Modified Simpson's rule using a short axis stack was used to calculate an ejection fraction on a dedicated work [employer]. The patient received 8 cc of Gadavist. After 10 minutes inversion recovery sequences were used to assess for infiltration and scar Curry. Velocity flow mapping performed in the ascending aorta and main pulmonary artery. CONTRAST:  8 cc  of Gadavist FINDINGS: 1. Normal left ventricular size and thickness. Mildly reduced LV systolic function (LVEF = 45%). There is global hypokinesis. There is no late gadolinium enhancement in the left ventricular myocardium. LVEDV: 172 ml LVESV: 94 ml SV: 77 ml CO: 4 L/min Myocardial mass: 90 g 2. Normal right ventricular size, thickness and low normal systolic function. There are no regional wall motion abnormalities. 3.  Normal left and right atrial size. 4. Normal size of the aortic root, ascending aorta and pulmonary artery. 5. Aortic valve prolapse, moderate aortic valve regurgitation. Regurgitant volume 51 ml, regurgitant fraction 53%. 6.  Normal  pericardium.  No pericardial effusion. IMPRESSION: 1.  Mildly reduced LV systolic function.  LVEF 45% 2.  No LGE or scar. 3.  Aortic valve prolapse and moderate aortic regurgitation. 4.  Poor image quality, cannot rule out aortic valve vegetation. 5.  Normal RV systolic function. Electronically Signed   By: Redell Cave M.D.   On: 07/09/2024 17:11   MR CARDIAC VELOCITY FLOW MAP Result Date: 07/09/2024 CLINICAL DATA:  Aortic regurgitation, possible aortic vegetation EXAM: CARDIAC MRI TECHNIQUE: The patient was scanned on a 1.5 Tesla Siemens magnet. A dedicated cardiac coil was used.  Functional imaging was done using Fiesta sequences. 2,3, and 4 chamber views were done to assess for RWMA's. Modified Simpson's rule using a short axis stack was used to calculate an ejection fraction on a dedicated work [employer]. The patient received 8 cc of Gadavist. After 10 minutes inversion recovery sequences were used to assess for infiltration and scar Curry. Velocity flow mapping performed in the ascending aorta and main pulmonary artery. CONTRAST:  8 cc  of Gadavist FINDINGS: 1. Normal left ventricular size and thickness. Mildly reduced LV systolic function (LVEF = 45%). There is global hypokinesis. There is no late gadolinium enhancement in the left ventricular myocardium. LVEDV: 172 ml LVESV: 94 ml SV: 77 ml CO: 4 L/min Myocardial mass: 90 g 2. Normal right ventricular size, thickness and low normal systolic function. There are no regional wall motion abnormalities. 3.  Normal left and right atrial size. 4. Normal size of the aortic root, ascending aorta and pulmonary artery. 5. Aortic valve prolapse, moderate aortic valve regurgitation. Regurgitant volume 51 ml, regurgitant fraction 53%. 6.  Normal pericardium.  No pericardial effusion. IMPRESSION: 1.  Mildly reduced LV systolic function.  LVEF 45% 2.  No LGE or scar. 3.  Aortic valve prolapse and moderate aortic regurgitation. 4.  Poor image  quality, cannot rule out aortic valve vegetation. 5.  Normal RV systolic function. Electronically Signed   By: Redell Cave M.D.   On: 07/09/2024 17:11   ECHOCARDIOGRAM COMPLETE Result Date: 07/08/2024    ECHOCARDIOGRAM REPORT   Patient Name:   Sergio Curry Date of Exam: 07/08/2024 Medical Rec #:  [id number]        Height:       69.0 in Accession #:    [id number]       Weight:       129.4 lb Date of Birth:  1961-06-24         BSA:          1.717 m Patient Age:    63 years         BP:           133/32 mmHg Patient Gender: M                HR:           84 bpm. Exam Location:  [facility] Procedure: 2D Echo, Cardiac Doppler and Color Doppler (Both Spectral and Color            Flow Doppler were utilized during procedure). Indications:     Elevated Troponin  History:         Patient has no prior history of Echocardiogram examinations.  Sonographer:     Thedora Louder RDCS, FASE Referring Phys:  [id number] LONELL KANDICE MOOSE Diagnosing Phys: Cara JONETTA Lovelace MD  Sonographer Comments: Technically difficult study due to poor echo windows. Image acquisition challenging due to patient behavioral factors. IMPRESSIONS  1. Possible SBE of anterior leaflet. Recommend TEE.  2. Technically difficult study.  3. Left ventricular ejection fraction, by estimation, is 45 to 50%. The left ventricle has mildly decreased function. The left ventricle demonstrates global hypokinesis. The left ventricular internal cavity size was moderately to severely dilated. Left ventricular diastolic function could not be evaluated.  4. Right ventricular systolic function is normal. The right ventricular size is normal.  5. Left atrial size was mildly dilated.  6. The mitral valve is myxomatous. Trivial mitral valve regurgitation.  7. Mobile mass on anterior leaflet prolapsing inyo the  outflow tract. SBE can not be excluded. Consider TEE.. The aortic valve is calcified. Aortic valve regurgitation is mild to moderate. Aortic valve sclerosis/calcification  is present, without any evidence of aortic stenosis. Conclusion(s)/Recommendation(s): Poor windows for evaluation of left ventricular function by transthoracic echocardiography. Would recommend an alternative means of evaluation. Findings concerning for aortic valve vegetation, would recommend a Transesophageal Echocardiogram for clarification. FINDINGS  Left Ventricle: Left ventricular ejection fraction, by estimation, is 45 to 50%. The left ventricle has mildly decreased function. The left ventricle demonstrates global hypokinesis. Strain was performed and the global longitudinal strain is indeterminate. The left ventricular internal cavity size was moderately to severely dilated. There is no left ventricular hypertrophy. Left ventricular diastolic function could not be evaluated. Right Ventricle: The right ventricular size is normal. No increase in right ventricular wall thickness. Right ventricular systolic function is normal. Left Atrium: Left atrial size was mildly dilated. Right Atrium: Right atrial size was normal in size. Pericardium: There is no evidence of pericardial effusion. Mitral Valve: The mitral valve is myxomatous. Trivial mitral valve regurgitation. Tricuspid Valve: The tricuspid valve is normal in structure. Tricuspid valve regurgitation is trivial. Aortic Valve: Mobile mass on anterior leaflet prolapsing inyo the outflow tract. SBE can not be excluded. Consider TEE. The aortic valve is calcified. Aortic valve regurgitation is mild to moderate. Aortic valve sclerosis/calcification is present, without any evidence of aortic stenosis. Aortic valve peak gradient measures 8.2 mmHg. Pulmonic Valve: The pulmonic valve was normal in structure. Pulmonic valve regurgitation is not visualized. Aorta: The ascending aorta was not well visualized. IAS/Shunts: No atrial level shunt detected by color flow Doppler. Additional Comments: Possible SBE of anterior leaflet. Recommend TEE. Technically difficult  study. 3D was performed not requiring image post processing on an independent workstation and was indeterminate.  LEFT VENTRICLE PLAX 2D LVIDd:         6.10 cm LVIDs:         4.70 cm LV PW:         1.10 cm LV IVS:        1.00 cm  LV Volumes (MOD) LV vol d, MOD A4C: 90.4 ml LV vol s, MOD A4C: 29.6 ml LV SV MOD A4C:     90.4 ml LEFT ATRIUM           Index LA diam:      4.10 cm 2.39 cm/m LA Vol (A4C): 26.0 ml 15.14 ml/m  AORTIC VALVE              PULMONIC VALVE AV Vmax:      143.00 cm/s RVOT Peak grad: 1 mmHg AV Peak Grad: 8.2 mmHg LVOT Vmax:    91.80 cm/s LVOT Vmean:   62.000 cm/s LVOT VTI:     0.196 m  AORTA Ao Asc diam: 3.10 cm  SHUNTS Systemic VTI: 0.20 m Cara JONETTA Lovelace MD Electronically signed by Cara JONETTA Lovelace MD Signature Date/Time: 07/08/2024/11:40:39 AM    Final    US  ABDOMEN LIMITED WITH LIVER DOPPLER Result Date: 07/08/2024 CLINICAL DATA:  63 year old male with history of hyperbilirubinemia. EXAM: DUPLEX ULTRASOUND OF LIVER TECHNIQUE: Color and duplex Doppler ultrasound was performed to evaluate the hepatic in-flow and out-flow vessels. COMPARISON:  None Available. FINDINGS: Liver: Diffusely coarsened echotexture with increased echogenicity normal hepatic contour without nodularity. No focal lesion, mass or intrahepatic biliary ductal dilatation. Main Portal Vein size: 0.83 cm Portal Vein Velocities Main Prox:  23 cm/sec, antegrade Main Dist:  23 cm/sec, antegrade Right: Not visualized. Left:  Not visualized. Hepatic Vein Velocities Right: Not visualized. Middle:  14.5 cm/sec Left: Not visualized. IVC: Present and patent with normal respiratory phasicity. Hepatic Artery Velocity:  216 cm/sec Splenic Vein Velocity: Not visualized. Spleen: 8.8 cm x 8.1 cm x 3.6 cm with a total volume of 134 cm^3 (411 cm^3 is upper limit normal) Portal Vein Occlusion/Thrombus: No Splenic Vein Occlusion/Thrombus: No Ascites: None Varices: None IMPRESSION: 1. Limited evaluation. The visualized portal vasculature is  patent with antegrade flow. 2. Diffusely coarsened and echogenic hepatic parenchyma as could be seen with hepatic steatosis or acute hepatitis. 3. No evidence of biliary ductal dilation. Ester Sides, MD [facility] Radiology Electronically Signed   By: Ester Sides M.D.   On: 07/08/2024 06:30   CT ABDOMEN PELVIS W CONTRAST Result Date: 07/07/2024 CLINICAL DATA:  Acute abdominal pain. Altered mental status and jaundice. EXAM: CT ABDOMEN AND PELVIS WITH CONTRAST TECHNIQUE: Multidetector CT imaging of the abdomen and pelvis was performed using the standard protocol following bolus administration of intravenous contrast. RADIATION DOSE REDUCTION: This exam was performed according to the departmental dose-optimization program which includes automated exposure control, adjustment of the mA and/or kV according to patient size and/or use of iterative reconstruction technique. CONTRAST:  75mL OMNIPAQUE IOHEXOL 300 MG/ML  SOLN COMPARISON:  None Available. FINDINGS: Lower chest: Bandlike areas of atelectasis within both lower lobes. Trace pleural thickening without significant effusion. Hepatobiliary: Enlarged liver spanning 18.9 cm cranial caudal. Advanced hepatic steatosis. Allowing for motion artifact, no focal liver abnormality. There may be subtle capsular nodularity, although motion obscures assessment. Small gallstone within minimally distended gallbladder. No biliary dilatation. Pancreas: Parenchymal atrophy. No ductal dilatation or inflammation. No evidence of pancreatic mass. Spleen: No splenomegaly.  No focal splenic abnormality. Adrenals/Urinary Tract: No adrenal nodule. No hydronephrosis. Symmetric bilateral perinephric stranding. No renal calculi or suspicious renal abnormality. Absent excretion on delayed phase imaging. Partially distended urinary bladder, mildly thick walled. Stomach/Bowel: Small hiatal hernia. Decompressed stomach. Small bowel is  decompressed. Normal appendix is tentatively visualized, regardless no appendicitis. Mild wall thickening about the ascending and hepatic flexure of the colon. Moderate stool within the left colon. No bowel obstruction. Vascular/Lymphatic: Aortic atherosclerosis. No aneurysm. The portal vein is patent allowing for motion. No abdominopelvic adenopathy. Reproductive: Prostate is unremarkable. Other: Trace free fluid in the pelvis, but no significant ascites. No free air. No abdominal wall hernia. Musculoskeletal: Remote left rib fractures. Chronic hip arthropathy and postsurgical change. No acute osseous findings. IMPRESSION: 1. Hepatomegaly and advanced hepatic steatosis. There may be subtle capsular nodularity, although motion obscures assessment. Recommend correlation with any clinical or laboratory findings of cirrhosis. 2. Mild wall thickening about the ascending and hepatic flexure of the colon, suspicious for colitis. 3. Cholelithiasis without gallbladder inflammation. 4. Absent renal excretion on delayed phase imaging, often seen with renal dysfunction. Aortic Atherosclerosis (ICD10-I70.0). Electronically Signed   By: Andrea Gasman M.D.   On: 07/07/2024 14:19   DG Chest Port 1 View Result Date: 07/07/2024 CLINICAL DATA:  Possible sepsis. Altered mental status and jaundice. Hypothermia. EXAM: PORTABLE CHEST 1 VIEW COMPARISON:  Head FINDINGS: The heart size and mediastinal contours are within normal limits. No consolidation, effusion, or pneumothorax. Old rib fractures are present bilaterally. IMPRESSION: No active disease. Electronically Signed   By: Leita Birmingham M.D.   On: 07/07/2024 14:15   CT Head Wo Contrast Result Date: 07/07/2024 EXAM: CT HEAD WITHOUT CONTRAST 07/07/2024 12:59:28 PM TECHNIQUE: CT of the head was performed without the administration  of intravenous contrast. Automated exposure control, iterative reconstruction, and/or weight based adjustment of the mA/kV was utilized to reduce  the radiation dose to as low as reasonably achievable. COMPARISON: 01/06/2016 CLINICAL HISTORY: Mental status change, unknown cause. AMS. FINDINGS: BRAIN AND VENTRICLES: No acute hemorrhage. No evidence of acute infarct. No hydrocephalus. No extra-axial collection. No mass effect or midline shift. Prominence of the sulci and ventricles compatible with brain atrophy. Hypoattenuating foci in the cerebral white matter, most likely representing chronic small vessel disease. ORBITS: No acute abnormality. SINUSES: No acute abnormality. SOFT TISSUES AND SKULL: No acute soft Curry abnormality. No skull fracture. IMPRESSION: 1. No acute intracranial abnormality. Electronically signed by: Waddell Calk MD 07/07/2024 01:04 PM EDT RP Workstation: [id number]    ECHO as above  TELEMETRY (personally reviewed): sinus rhythm 1st degree AVB rate 80s  EKG (personally reviewed): no EKG on admission for review  Data reviewed by me 07/10/2024: last 24h vitals tele labs imaging I/O ED provider note, admission H&P, PCCM notes  Principal Problem:   Acute metabolic encephalopathy    ASSESSMENT AND PLAN:  Sergio Curry is a 63 y.o. male  with a past medical history of alcohol use, liver disease who presented to the ED on 07/07/2024 for altered mental status and jaundice. Echo this admission with concern for SBE of anterior leaflet of aortic valve. Cardiology was consulted for further evaluation.   # Possible endocarditis # Acute toxic metabolic encephalopathy # Alcoholic hepatitis # Severe thrombocytopenia Patient initially presented for altered mental status, jaundice.  Treated for alcoholic hepatitis with sepsis, possible colitis.  Severely thrombocytopenic since admission.  Echo done 07/08/2024 with possible vegetation of aortic valve. cMRI with poor image quality, unable to rule out endocarditis. Did note aortic valve prolapse with moderate AR. - Given patient is not a candidate for TEE given persistent  thrombocytopenia, would recommend treating for SBE - ID has been notified. - Further management of alcoholic hepatitis, metabolic encephalopathy, anemia and thrombocytopenia as per primary team. -Mildly elevated and flat trending troponins most consistent with demand/supply mismatch and not ACS in the setting of above illness.  This patient's plan of care was discussed and created with Dr. Custovic and he is in agreement.  Signed: Danita Bloch, PA-C  07/10/2024, 7:27 AM [facility] Cardiology

## 2024-07-10 NOTE — Procedures (Addendum)
 Intubation Procedure Note  Sergio Curry  [id number]  1961/05/30  Date:07/10/24  Time:11:04 AM   Provider Performing:Stefanie Hodgens    Procedure: Intubation (31500)  Indication(s) Respiratory Failure  Consent Risks of the procedure as well as the alternatives and risks of each were explained to the patient and/or caregiver.  Consent for the procedure was obtained and is signed in the bedside chart   Anesthesia Etomidate and Rocuronium   Time Out Verified patient identification, verified procedure, site/side was marked, verified correct patient position, special equipment/implants available, medications/allergies/relevant history reviewed, required imaging and test results available.   Sterile Technique Usual hand hygeine, masks, and gloves were used   Procedure Description Patient positioned in bed supine.  Sedation given as noted above.  Patient was intubated with endotracheal tube using Glidescope.  View was Grade 1 full glottis .  Number of attempts was 1.  Colorimetric CO2 detector was consistent with tracheal placement.   Complications/Tolerance None; patient tolerated the procedure well. Chest X-ray is ordered to verify placement.   EBL Minimal   Specimen(s) None  Robet Kim, PA-C [facility] PCCM Team Contact Info: [phone]

## 2024-07-10 NOTE — Progress Notes (Signed)
 ET tube withdrawn 2 cm per provider order. ET tube is now at 24 at the lip.

## 2024-07-10 NOTE — Consult Note (Signed)
 Pharmacy Antibiotic Note  Sergio Curry is a 63 y.o. male admitted on 07/07/2024 with potential endocarditis.  Pharmacy has been consulted for vancomycin dosing.  Plan: Loading dose vancomycin 1500mg    Then, Vancomycin 1000mg  IV every 24 hours.  Goal trough 15-20 mcg/mL.  Vd used: 0.72 Weight used: 65 kg Scr used: 1.80 Estimated AUC: 592.9 Estimated trough,ss: 16.1  Height: 5' 9 (175.3 cm) Weight: 65 kg (143 lb 4.8 oz) IBW/kg (Calculated) : 70.7  Temp (24hrs), Avg:98 F (36.7 C), Min:97.5 F (36.4 C), Max:98.8 F (37.1 C)  Recent Labs  Lab 07/07/24 1210 07/07/24 1433 07/07/24 1957 07/08/24 0426 07/08/24 0829 07/08/24 1110 07/09/24 0555 07/09/24 1052 07/10/24 0313 07/10/24 0912  WBC 15.3*  --   --  17.9*  --   --  18.3* 17.8* 20.6*  --   CREATININE 1.68*  --   --  1.42*  --   --  1.77*  --  1.80*  --   LATICACIDVEN >9.0* 8.8* 6.4*  --  1.8 1.3  --   --   --  1.4    Estimated Creatinine Clearance: 38.1 mL/min (A) (by C-G formula based on SCr of 1.8 mg/dL (H)).    No Known Allergies  Antimicrobials this admission: Vancomycin 10/25, 1028 >>  Zosyn 10/25 >>  Doxycycline 10/28 >>  Cefepime 10/25   Dose adjustments this admission: N/a  Microbiology results: 10/25 BCx: 4/4 bottles MSSE 10/25 MRSA PCR: not detected  Thank you for allowing pharmacy to be a part of this patient's care.  Bernardino George, PharmD Candidate [phone] [facility] [employer] 07/10/2024 12:42 PM

## 2024-07-10 NOTE — Progress Notes (Addendum)
 NAME:  Sergio Curry, MRN:  [id number], DOB:  [birth year], LOS: 3 ADMISSION DATE:  07/07/2024, CONSULTATION DATE: 07/07/2024 REFERRING MD: Dr. Jacolyn, CHIEF COMPLAINT: Altered Mental Status  History of Present Illness:  This is a 63 yo male who presented to [facility] ER on 10/25 via EMS with altered mental status and jaundice.  Per ED notes EMS reported when they arrived on the scene pt hypothermic temp 93.9 F, therefore they administered 500 ml NS bolus.    ED Course  Upon arrival to the ER pt reported he does have liver disease and drinks a few beers a day. Initial vital signs were: temp 94.1 F/sbp 97/hr 96/rr 20/O2 sats 94% on RA.  Significant lab results were: Na+132/K+ 2.9/chloride 96/CO2 18/BUN 37/creatinine 1.68/calcium 8.4/anion 8.4/alk phos 163/albumin 1.8/AST 128/ammonia 46/total bilirubin 8.3/troponin 171/lactic >9.0/wbc 15.3/hgb 6.6/platelet count 6/PT 20.7/INR 1.7/alcohol level <15.  Pt denies bloody stools, melena, or vomiting blood.  He denies taking blood thinners.  CT Head negative.  While in the ER pt received 2.5L LR bolus and flagyl/vancomycin ordered.  CT Abd/Pelvis revealed hepatomegaly and advanced hepatic steatosis, possible colitis, and cholelithiasis without gall bladder inflammation.  PCCM team contacted for ICU admission.    Pertinent  Medical History  Alcoholic Withdrawal Seizures   Micro Data:   10/25 MRSA PCR >>negative  10/25 Resp panel by RT-PCR >>negative  10/25 Respiratory Viral Panel >>negative  10/25 Blood Cultures x 2 >>staph epi in 1 of 4 bottles likely contaminant  10/27 Repeat Blood Cultures >> + Staph Epi  Anti-infectives (From admission, onward)    Start     Dose/Rate Route Frequency Ordered Stop   07/10/24 0600  doxycycline (VIBRAMYCIN) 100 mg in sodium chloride  0.9 % 250 mL IVPB        100 mg 125 mL/hr over 120 Minutes Intravenous Every 12 hours 07/10/24 0428 07/17/24 0559   07/07/24 2200  piperacillin-tazobactam (ZOSYN) IVPB 3.375 g         3.375 g 12.5 mL/hr over 240 Minutes Intravenous Every 8 hours 07/07/24 1505     07/07/24 2100  cefTRIAXone (ROCEPHIN) 2 g in sodium chloride  0.9 % 100 mL IVPB  Status:  Discontinued        2 g 200 mL/hr over 30 Minutes Intravenous Every 24 hours 07/07/24 1439 07/07/24 1453   07/07/24 1330  ceFEPIme (MAXIPIME) 2 g in sodium chloride  0.9 % 100 mL IVPB        2 g 200 mL/hr over 30 Minutes Intravenous  Once 07/07/24 1325 07/07/24 1438   07/07/24 1330  metroNIDAZOLE (FLAGYL) IVPB 500 mg        500 mg 100 mL/hr over 60 Minutes Intravenous  Once 07/07/24 1325 07/07/24 1810   07/07/24 1330  vancomycin (VANCOCIN) IVPB 1000 mg/200 mL premix        1,000 mg 200 mL/hr over 60 Minutes Intravenous  Once 07/07/24 1325 07/07/24 1910      Significant Hospital Events: Including procedures, antibiotic start and stop dates in addition to other pertinent events   10/25: Admitted with acute toxic metabolic encephalopathy, sepsis, possible colitis, alcoholic hepatitis, severe lactic acidosis, macrocytic anemia, and pancytopenia.  Pt received 2 units of pRBC's and 2 units of platelets  10/26: Pt showed signs of ETOH withdrawal overnight requiring ativan  per CIWA protocol once.  CBC stable at this time.  GI consulted and will see pt today  10/27: Pt is on 5L [place]. Remains in withdrawal and not oriented. Platelets 12 this am. Requiring prn  Ativan . 10/28: Critically ill appearing with increasing oxygen requirements, on HHFNC this am and transitioned to Bipap. Worsening kidney function, as well as elevated BNP requiring lasix 80mg  x 1. Hypotension requiring levophed gtt.   Interim History / Subjective:  As outlined above under significant events    Objective    Blood pressure (!) 108/40, pulse 91, temperature 97.6 F (36.4 C), temperature source Axillary, resp. rate (!) 30, height 5' 9 (1.753 m), weight 65 kg, SpO2 92%.    FiO2 (%):  [65 %] 65 %   Intake/Output Summary (Last 24 hours) at 07/10/2024 0818 Last  data filed at 07/10/2024 0700 Gross per 24 hour  Intake 3409.97 ml  Output 1321 ml  Net 2088.97 ml   Filed Weights   07/08/24 0500 07/09/24 0500 07/10/24 0500  Weight: 58.7 kg 58.7 kg 65 kg   Examination: General: Acute on chronically-ill appearing male, in respiratory distress placed on Bipap HENT: Atraumatic, normocephalic, supple, no JVD, scleral icterus  Lungs: coarse breath sounds throughout, labored, on Bipap Cardiovascular: Sinus rhythm, s1s2, no r/g, no m/r/g Abdomen: soft, non-distended, non-tender, no guarding/rebound, bowel sounds x 4 Extremities: warm, dry, intact, moves all extremities, radial and distal pulses 2+, edema +2 of upper extremities and 3+ of lower extremities Neuro: alert to voice only, not oriented, following few commands, withdraws from painful stimulation, PERRL with minimal pupillary constriction Skin: Jaundiced and generalized scattered petechiae  GU: External catheter draining very minimal dark urine  Resolved problem list   Assessment and Plan   #Acute Toxic Metabolic Encephalopathy  #ETOH Abuse with Complicated Withdrawal #Hyperammonemia  - Continue CIWA protocol; ativan  prn - High dose thiamine x 4 days, followed by 100mg  daily and folic acid daily - Continue Phenobarbital taper - Correct metabolic derangements as indicated - Maintain sleep/wake cycle - Lactulose twice daily   #Septic Shock secondary to Bacteremia vs. 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 - Continuous cardiac monitoring  - Vasopressors to maintain MAP goal >65 ~ started on levophed - Wean pressors as able - Troponin peak: 171 - Not currently a TEE candidate per cardiology d/t thrombocytopenia - Cardiac MRI could not rule out SBE - Cardiology following; will treat empirically for endocarditis and inform ID   #Acute Hypoxic Respiratory Failure secondary to Multifocal vs. Aspiration Pneumonia #Pulmonary Edema #Trace Pleural Effusion 10/28 CXR: Interval increase in bilateral perihilar R>L consolidative and patchy airspace opacities, likely multifocal pneumonia or aspiration pneumonia; blunting of costophrenic angle with likely trace pleural effusion - Supplemental oxygen to maintain O2 >92% - Transitioned from [facility] to [facility], now on Bipap - Low threshold for intubation - Intermittent CXR and ABG - BNP 2360 - Lasix 80mg  x 1 - ABX: continue Zosyn + Doxycycline and added Vanc - Will give morphine x 1 to assist with respiratory distress   #Acute Kidney Injury suspect secondary to ATN  #Anion Gap Metabolic Acidosis secondary to Bacteremia #Severe Lactic Acidosis ~ Resolved #Hyponatremia~ Resolved #Hypokalemia  - Strict I/O - Trend BMP and renal function; Creatinine 1.80 this am - Trend lactic acid - Electrolyte replacement protocol as indicated -  Pharmacy to assist with replacement  - Potassium 3.5, replace as indicated - Avoid nephrotoxins as able - Ensure renal perfusion - Albumin 25g twice daily  - If renal function and UO worsens, consider Nephro consult   #Alcoholic Hepatitis  #Elevated alk phos  #Hyperbilirubinemia #Hypoalbuminemia CT Abd Pelvis W Contrast 10/25: Hepatomegaly and advanced hepatic steatosis. There may be subtle capsular nodularity, although motion obscures assessment. Recommend correlation with any clinical or laboratory findings of cirrhosis. Mild wall thickening about the ascending and hepatic flexure of the colon,  suspicious for colitis. Cholelithiasis without gallbladder inflammation. Absent renal excretion on delayed phase imaging, often seen with renal dysfunction. Aortic Atherosclerosis US  ABD LTD RUG W Liver Doppler 10/25: Limited evaluation. The visualized portal vasculature is patent with antegrade flow. Diffusely coarsened and echogenic hepatic parenchyma as could be seen with hepatic steatosis or acute hepatitis. No evidence of biliary ductal dilation - Trend hepatic function panel - Tbili improving at 10.1 - Acute hepatitis panel: non-reactive - Albumin this am 1.6 - Albumin 25g twice daily  - Per GI, will need to have platelets >50K prior to endoscopic evaluation   #Septic Shock secondary to Staph Epi Bacteremia vs Multifocal or Aspiration Pneumonia #Suspected Endocarditis (unable to rule out via cardiac MRI) #Possible Colitis - Trend WBC and monitor fever curve - WBC 20.6, remains afebrile - 10/28 Repeat blood cultures: + staph epidermis - ABX: Zosyn + Doxycyline, added Vanc - Pharmacy to assist with dosing - Consider ID consult   #Macrocytic Anemia  #Anemia without signs of bleeding  #Severe Thrombocytopenia due to Alcoholic Hepatitis  #Elevated coags  - Trend CBC and monitor H&H, Coags - Transfuse if Hgb < 7 or platelets <10K or s/sx of bleeding - Previously received 2 units pRBC and 2 packs of platelets - Platelets 12 this am - Monitor for s/sx of bleeding - Blood smear: normal platelet morphology - Per heme, will check fibrinogen once a week; most recent 195 - Immature Platelet fraction elevated at 13.9 - Heme following; appreciate input - DVT prophylaxis: SCDs (avoid chemical VTE given anemia and coag results) - PPI: Protonix 40mg  Q12h   #Endo - ICU hypo/hyperglycemic protocol - CBG Q4h - Glucose goal: 140-180   #Protein Malnutrition  - Diet: NPO - Dietician following; appreciate input    Labs   CBC: Recent Labs  Lab 07/07/24 1210 07/08/24 0426  07/09/24 0555 07/09/24 1052 07/10/24 0313  WBC 15.3* 17.9* 18.3* 17.8* 20.6*  NEUTROABS 11.1* 12.3*  --  13.4*  --   HGB 6.6* 7.9* 8.9* 8.3* 8.2*  HCT 19.8* 22.6* 26.0* 24.6* 24.1*  MCV 111.2* 95.8 98.1 98.8 98.4  PLT 6* 25* 12* 13* 12*    Basic Metabolic Panel: Recent Labs  Lab 07/07/24 1210 07/08/24 0426 07/08/24 2003 07/09/24 0555 07/10/24 0313  NA 132* 133*  --  134* 138  K 2.9* 2.9* 3.2* 3.1* 3.5  CL 96* 96*  --  100 101  CO2 18* 23  --  22 21*  GLUCOSE 79 128*  --  105* 106*  BUN 37* 40*  --  55* 56*  CREATININE 1.68* 1.42*  --  1.77* 1.80*  CALCIUM 8.4* 7.4*  --  7.9* 7.7*  MG 1.6* 2.3  --  2.6* 2.3  PHOS 1.4* 3.9  --  3.1 3.4   GFR: Estimated Creatinine Clearance: 38.1 mL/min (A) (by C-G formula based on SCr of 1.8 mg/dL (H)). Recent Labs  Lab 07/07/24 1433 07/07/24 1957 07/08/24 0426 07/08/24 9170 07/08/24  1110 07/09/24 0555 07/09/24 1052 07/10/24 0313  WBC  --   --  17.9*  --   --  18.3* 17.8* 20.6*  LATICACIDVEN 8.8* 6.4*  --  1.8 1.3  --   --   --     Liver Function Tests: Recent Labs  Lab 07/07/24 1210 07/08/24 0426 07/09/24 0555 07/10/24 0313  AST 128* 100* 103* 94*  ALT 30 25 27 25   ALKPHOS 163* 118 132* 119  BILITOT 8.3* 8.3* 11.0* 10.1*  PROT 6.5 5.4* 6.3* 6.0*  ALBUMIN 1.8* 1.7* 1.9* 1.6*   Recent Labs  Lab 07/07/24 1210  LIPASE 16   Recent Labs  Lab 07/07/24 1210 07/10/24 0313  AMMONIA 46* 46*    ABG    Component Value Date/Time   PHART 7.43 07/10/2024 0428   PCO2ART 33 07/10/2024 0428   PO2ART 69 (L) 07/10/2024 0428   HCO3 21.9 07/10/2024 0428   ACIDBASEDEF 1.7 07/10/2024 0428   O2SAT 95.7 07/10/2024 0428     Coagulation Profile: Recent Labs  Lab 07/07/24 1210 07/08/24 1110 07/09/24 1052  INR 1.7* 1.5* 1.5*    Cardiac Enzymes: No results for input(s): CKTOTAL, CKMB, CKMBINDEX, TROPONINI in the last 168 hours.  HbA1C: No results found for: HGBA1C  CBG: Recent Labs  Lab 07/09/24 1559  07/09/24 1947 07/09/24 2318 07/10/24 0314 07/10/24 0732  GLUCAP 103* 95 100* 99 94    Review of Systems: Positives in BOLD   Review of Systems  Constitutional:  Negative for chills and fever.  Respiratory:  Negative for cough, shortness of breath and wheezing.   Cardiovascular:  Negative for chest pain and palpitations.  Gastrointestinal:  Negative for abdominal pain, heartburn, nausea and vomiting.  Genitourinary:  Negative for dysuria.  Skin:  Negative for itching.  Neurological:  Negative for dizziness and headaches.     Past Medical History:  He,  has a past medical history of Seizures (HCC).   Surgical History:  No past surgical history on file.   Social History:   reports that he has quit smoking. His smoking use included cigarettes. He has never used smokeless tobacco. He reports current alcohol use of about 7.0 standard drinks of alcohol per week. He reports current drug use. Drug: Marijuana.   Family History:  His family history is not on file.   Allergies No Known Allergies   Home Medications  Prior to Admission medications   Medication Sig Start Date End Date Taking? Authorizing Provider  chlordiazePOXIDE  (LIBRIUM ) 25 MG capsule Take 1 capsule 5 times a day on day 1, then decrease by one capsule daily until gone 01/06/16   Trudy Dorn BRAVO, MD  ciprofloxacin -dexamethasone  (CIPRODEX ) OTIC suspension Place 4 drops into the right ear 2 (two) times daily. 05/22/20   LampteyAleene KIDD, MD  cloNIDine  (CATAPRES  - DOSED IN MG/24 HR) 0.1 mg/24hr patch Place 1 patch (0.1 mg total) onto the skin every 7 (seven) days. 01/06/16   Trudy Dorn BRAVO, MD     Critical care time: 65 minutes      Robet Kim, PA-C [facility] PCCM Team Contact Info: [phone]

## 2024-07-10 NOTE — Procedures (Signed)
 Central Venous Catheter Insertion Procedure Note  Sergio Curry  [id number]  01-06-1961  Date:07/10/24  Time:1:51 PM   Provider Performing:Shabree Tebbetts   Procedure: Insertion of Non-tunneled Central Venous Catheter(36556) with US  guidance (23062)   Indication(s) Medication administration and Difficult access  Consent Risks of the procedure as well as the alternatives and risks of each were explained to the patient and/or caregiver.  Consent for the procedure was obtained and is signed in the bedside chart  Anesthesia None required  Timeout Verified patient identification, verified procedure, site/side was marked, verified correct patient position, special equipment/implants available, medications/allergies/relevant history reviewed, required imaging and test results available.  Sterile Technique Maximal sterile technique including full sterile barrier drape, hand hygiene, sterile gown, sterile gloves, mask, hair covering, sterile ultrasound probe cover (if used).  Procedure Description Area of catheter insertion was cleaned with chlorhexidine and draped in sterile fashion.  With real-time ultrasound guidance a central venous catheter was placed into the left internal jugular vein. Nonpulsatile blood flow and easy flushing noted in all ports.  The catheter was sutured in place and sterile dressing applied.  Complications/Tolerance None; patient tolerated the procedure well. Chest X-ray is ordered to verify placement for internal jugular or subclavian cannulation.   Chest x-ray is not ordered for femoral cannulation.  EBL Minimal  Specimen(s) None   Sergio Kim, PA-C [place] [facility] PCCM Team Contact Info: [phone]

## 2024-07-10 NOTE — Consult Note (Signed)
 PHARMACY CONSULT NOTE - ELECTROLYTES  Pharmacy Consult for Electrolyte Monitoring and Replacement   Recent Labs: Height: 5' 9 (175.3 cm) Weight: 65 kg (143 lb 4.8 oz) IBW/kg (Calculated) : 70.7 Estimated Creatinine Clearance: 38.1 mL/min (A) (by C-G formula based on SCr of 1.8 mg/dL (H)). Potassium (mmol/L)  Date Value  07/10/2024 3.5   Magnesium (mg/dL)  Date Value  89/71/7974 2.3   Calcium (mg/dL)  Date Value  89/71/7974 7.7 (L)   Albumin (g/dL)  Date Value  89/71/7974 1.6 (L)   Phosphorus (mg/dL)  Date Value  89/71/7974 3.4   Sodium (mmol/L)  Date Value  07/10/2024 138   Corrected Ca: 9.6 mg/dL  Assessment  Sergio Curry is a 63 y.o. male presenting with altered mental status and jaundice. PMH significant for seizures and liver disease. Pharmacy has been consulted to monitor and replace electrolytes.  Diet: NPO   Pertinent medications:  - Received furosemide 40mg  IV x1 yesterday  Goal of Therapy: Electrolytes WNL  Plan:  Kcl 10mEq IV x 4  Check BMP, Mg, Phos with AM labs  Thank you for allowing pharmacy to be a part of this patient's care.  Bernardino George, PharmD Candidate [phone] [employer] 07/10/2024 7:37 AM

## 2024-07-10 NOTE — TOC Progression Note (Signed)
 Transition of Care [facility]) - Progression Note    Patient Details  Name: Sergio Curry MRN: [id number] Date of Birth: 1960/11/29  [facility] of [facility] CM/SW Contact  Corrie JINNY Ruts, LCSW Phone Number: 07/10/2024, 11:18 AM  Clinical Narrative:    Chart reviewed. Patient was undergoing a procedure. SW will follow up with patient or NOK to consult.                      Expected Discharge Plan and Services                                               Social Drivers of Health (SDOH) Interventions SDOH Screenings   Food Insecurity: No Food Insecurity (07/07/2024)  Housing: Low Risk  (07/07/2024)  Transportation Needs: No Transportation Needs (07/07/2024)  Utilities: Not At Risk (07/07/2024)  Tobacco Use: Medium Risk (05/21/2020)    Readmission Risk Interventions     No data to display

## 2024-07-10 NOTE — Consult Note (Signed)
 NAME: Sergio Curry  DOB: 01-23-1961  MRN: [id number]  Date/Time: 07/10/2024 2:46 PM  REQUESTING PROVIDER: Dr.Assaker Subjective:  REASON FOR CONSULT: bacteremia and endocarditis ? Sergio Curry is a 62 y.o. male with a history of alcohol abuse, seizures due to alcohol withdrawal is presenting from home with altered mental status weakness and confusion and jaundice.  As per his partner at bedside patient drinks bourbon and beer every day Recently she had found multiple empty bottles in the suitcase She had stayed with her daughter taking care of her grandchild and when she returned she found him in to be confused and was sitting in the couch and hence she called EMS and brought him in  Vitals in the ED BP of 118/63, temperature 97.5, heart rate 108. Labs revealed a sodium of 132, potassium 2.9, chloride 96, bicarb 18, BUN 37 and creatinine of 1.68. WBC was 15.3, Hb 6.6, platelets 6, lactate more than 9. Total bilirubin was 8.3 AST was 128 and ALT was 30. Blood cultures were sent  Patient was admitted to ICU with a diagnosis of acute metabolic encephalopathy likely EtOH abuse hyperammonia and elevated troponin suspicion of demand ischemia and alcoholic hepatitis. Patient was started on vancomycin, Zosyn CT abdomen and pelvis revealed enlarged liver with hepatic steatosis and subcapsular nodularity.  Partially distended bladder.  Mild wall thickening of the ascending colon and hepatic flexure.  Patient was seen by GI on 07/08/2024.  Recommended initiating lactulose and monitoring H&H.  And transfusion as per primary team.  Patient was started on phenobarbital for delirium tremens  Blood culture came back positive for Staph epidermidis which is usually a contaminant  Patient's chest x-ray did not show any pneumonia patient had a 2D echo which showed EF of 45 to 50%. Left ventricle demonstrates global hypokinesis.  Left ventricular internal cavity size was moderate to severely dilated.   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  Endocarditis could not be excluded.  So cardiology was consulted for TEE.  Because of low platelet count they opted to do cardiac MRI which was inconclusive for the aortic valve vegetation Meanwhile patient got intubated because of increasing shortness of breath and found to be in flash pulmonary edema. 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 for avascular necrosis He takes ibuprofen for pain in the hip and she states that there was a bottle of 100 tablets of ibuprofen which he may have finished but she does not know in  what timeframe   Past Medical History:  Diagnosis Date   Seizures (HCC)   Past surgical history Bilateral hip replacement Social History   Socioeconomic History   Marital status: Single    Spouse name: Not on file   Number of children: Not on file   Years of education: Not on file   Highest education level: Not on file  Occupational History   Not on file  Tobacco Use   Smoking status: Former    Types: Cigarettes   Smokeless tobacco: Never  Substance and Sexual Activity   Alcohol use: Yes    Alcohol/week: 7.0 standard drinks of alcohol    Types: 4 Cans of beer, 3  Shots of liquor per week   Drug use: Yes    Types: Marijuana   Sexual activity: Not Currently    Birth control/protection: None  Other Topics Concern   Not on file  Social History Narrative   Not on file   Social Drivers of Health   Financial Resource Strain: Not on file  Food Insecurity: No Food Insecurity (07/07/2024)   Hunger Vital Sign    Worried About Running Out of Food in the Last Year: Never true    Ran Out of Food in the Last Year:  Never true  Transportation Needs: No Transportation Needs (07/07/2024)   PRAPARE - Administrator, Civil Service (Medical): No    Lack of Transportation (Non-Medical): No  Physical Activity: Not on file  Stress: Not on file  Social Connections: Not on file  Intimate Partner Violence: Not At Risk (07/07/2024)   Humiliation, Afraid, Rape, and Kick questionnaire    Fear of Current or Ex-Partner: No    Emotionally Abused: No    Physically Abused: No    Sexually Abused: No    No family history on file. No Known Allergies I? Current Facility-Administered Medications  Medication Dose Route Frequency Provider Last Rate Last Admin   0.9 %  sodium chloride  infusion  250 mL Intravenous Continuous Lenon Elsie HERO, RPH 20 mL/hr at 07/10/24 1424 Infusion Verify at 07/10/24 1424   albumin human 25 % solution 25 g  25 g Intravenous BID Malka Domino, MD   Stopped at 07/10/24 1122   Chlorhexidine Gluconate Cloth 2 % PADS 6 each  6 each Topical Daily Nelson, Dana G, NP   6 each at 07/10/24 9076   docusate (COLACE) 50 MG/5ML liquid 100 mg  100 mg Per Tube BID Keene, Jeremiah D, NP       docusate sodium (COLACE) capsule 100 mg  100 mg Oral BID PRN Nelson, Dana G, NP       doxycycline (VIBRAMYCIN) 100 mg in sodium chloride  0.9 % 250 mL IVPB  100 mg Intravenous Q12H Rust-Chester, Jenita CROME, NP   Stopped at 07/10/24 0810   fentaNYL (SUBLIMAZE) injection 50 mcg  50 mcg Intravenous Q15 min PRN Keene, Jeremiah D, NP       fentaNYL (SUBLIMAZE) injection 50-200 mcg  50-200 mcg Intravenous Q30 min PRN Keene, Jeremiah D, NP       folic acid injection 1 mg  1 mg Intravenous Daily Nelson, Dana G, NP   1 mg at 07/10/24 9073   furosemide (LASIX) injection 80 mg  80 mg Intravenous Once Bousman, Karlie, PA-C       lactulose (CHRONULAC) enema 200 gm  300 mL Rectal BID Nelson, Dana G, NP   300 mL at 07/10/24 0725   midazolam PF (VERSED) injection 1-2 mg  1-2 mg Intravenous Q1H PRN Keene, Jeremiah D,  NP       norepinephrine (LEVOPHED) 4mg  in (0.016 mg/mL) premix infusion  0-40 mcg/min Intravenous Titrated Clair Marolyn NOVAK, RPH 48.8 mL/hr at 07/10/24 1424 13 mcg/min at 07/10/24 1424   Oral care mouth rinse  15 mL Mouth Rinse PRN Kasa, Kurian, MD       pantoprazole (PROTONIX) injection 40 mg  40 mg Intravenous Q12H Nelson, Dana G, NP   40 mg at 07/10/24 0444   PHENObarbital (LUMINAL) injection 97.5 mg  97.5 mg Intravenous Q8H Assaker, Domino, MD   97.5 mg at 07/10/24 1345   Followed by   NOREEN ON 07/11/2024] PHENObarbital (LUMINAL)  injection 65 mg  65 mg Intravenous Q8H Assaker, Jean-Pierre, MD       Followed by   NOREEN ON 07/13/2024] PHENObarbital (LUMINAL) injection 32.5 mg  32.5 mg Intravenous Q8H Assaker, Jean-Pierre, MD       piperacillin-tazobactam (ZOSYN) IVPB 3.375 g  3.375 g Intravenous Q8H Viviana Leonor BROCKS, [place]   Stopped at 07/10/24 9078   polyethylene glycol (MIRALAX / GLYCOLAX) packet 17 g  17 g Oral Daily PRN Nelson, Dana G, NP       polyethylene glycol (MIRALAX / GLYCOLAX) packet 17 g  17 g Per Tube Daily Keene, Jeremiah D, NP       propofol (DIPRIVAN) 1000 MG/100ML infusion  0-80 mcg/kg/min Intravenous Continuous Shellia Mann D, NP 1.95 mL/hr at 07/10/24 1424 5 mcg/kg/min at 07/10/24 1424   sodium chloride  flush (NS) 0.9 % injection 10-40 mL  10-40 mL Intracatheter Q12H Bousman, Karlie, PA-C       sodium chloride  flush (NS) 0.9 % injection 10-40 mL  10-40 mL Intracatheter PRN Bousman, Karlie, PA-C       [START ON 07/11/2024] thiamine (VITAMIN B1) injection 100 mg  100 mg Intravenous Daily Nelson, Dana G, NP       [START ON 07/11/2024] vancomycin (VANCOCIN) IVPB 1000 mg/200 mL premix  1,000 mg Intravenous Q24H Grubb, Rodney D, RPH       vancomycin (VANCOREADY) IVPB 1500 mg/300 mL  1,500 mg Intravenous Once Viviana Leonor BROCKS, [place] 150 mL/hr at 07/10/24 1424 Infusion Verify at 07/10/24 1424     Abtx:  Anti-infectives (From admission, onward)    Start     Dose/Rate  Route Frequency Ordered Stop   07/11/24 1000  vancomycin (VANCOCIN) IVPB 1000 mg/200 mL premix        1,000 mg 200 mL/hr over 60 Minutes Intravenous Every 24 hours 07/10/24 1331     07/10/24 1030  vancomycin (VANCOREADY) IVPB 1500 mg/300 mL        1,500 mg 150 mL/hr over 120 Minutes Intravenous  Once 07/10/24 0920     07/10/24 0600  doxycycline (VIBRAMYCIN) 100 mg in sodium chloride  0.9 % 250 mL IVPB        100 mg 125 mL/hr over 120 Minutes Intravenous Every 12 hours 07/10/24 0428 07/17/24 0559   07/07/24 2200  piperacillin-tazobactam (ZOSYN) IVPB 3.375 g        3.375 g 12.5 mL/hr over 240 Minutes Intravenous Every 8 hours 07/07/24 1505     07/07/24 2100  cefTRIAXone (ROCEPHIN) 2 g in sodium chloride  0.9 % 100 mL IVPB  Status:  Discontinued        2 g 200 mL/hr over 30 Minutes Intravenous Every 24 hours 07/07/24 1439 07/07/24 1453   07/07/24 1330  ceFEPIme (MAXIPIME) 2 g in sodium chloride  0.9 % 100 mL IVPB        2 g 200 mL/hr over 30 Minutes Intravenous  Once 07/07/24 1325 07/07/24 1438   07/07/24 1330  metroNIDAZOLE (FLAGYL) IVPB 500 mg        500 mg 100 mL/hr over 60 Minutes Intravenous  Once 07/07/24 1325 07/07/24 1810   07/07/24 1330  vancomycin (VANCOCIN) IVPB 1000 mg/200 mL premix        1,000 mg 200 mL/hr over 60 Minutes Intravenous  Once 07/07/24 1325 07/07/24 1910       REVIEW OF SYSTEMS:  NA aS patient is intubated Objective:  VITALS:  BP (!) 113/37 (BP Location: Left Arm)   Pulse 89   Temp 98.6 F (  37 C) (Oral)   Resp 20   Ht 5' 9 (1.753 m)   Wt 65 kg   SpO2 95%   BMI 21.16 kg/m   PHYSICAL EXAM:  General: Intubated, sedated HENT head: Normocephalic, without obvious abnormality, atraumatic. Eyes: Icteric sclera  ENT unable to examine  Neck: Supple, symmetrical, no adenopathy, thyroid: non tender no carotid bruit and no JVD.  Bilateral air entry Creps in the bases Heart: Tachycardia  abdomen: Soft, non-tender,not distended. Bowel sounds normal. No  masses Extremities: atraumatic, no cyanosis. No edema. No clubbing Skin: Bruise seen, petechiae  Lymph: Cervical, supraclavicular normal. Neurologic: Cannot be assessed Pertinent Labs Lab Results CBC    Component Value Date/Time   WBC 20.6 (H) 07/10/2024 0313   RBC 2.45 (L) 07/10/2024 0313   HGB 8.2 (L) 07/10/2024 0313   HCT 24.1 (L) 07/10/2024 0313   PLT 12 (LL) 07/10/2024 0313   MCV 98.4 07/10/2024 0313   MCH 33.5 07/10/2024 0313   MCHC 34.0 07/10/2024 0313   RDW 20.2 (H) 07/10/2024 0313   LYMPHSABS 1.8 07/09/2024 1052   MONOABS 2.0 (H) 07/09/2024 1052   EOSABS 0.0 07/09/2024 1052   BASOSABS 0.1 07/09/2024 1052       Latest Ref Rng & Units 07/10/2024    3:13 AM 07/09/2024    5:55 AM 07/08/2024    8:03 PM  CMP  Glucose 70 - 99 mg/dL 893  894    BUN 8 - 23 mg/dL 56  55    Creatinine 9.38 - 1.24 mg/dL 8.19  8.22    Sodium 864 - 145 mmol/L 138  134    Potassium 3.5 - 5.1 mmol/L 3.5  3.1  3.2   Chloride 98 - 111 mmol/L 101  100    CO2 22 - 32 mmol/L 21  22    Calcium 8.9 - 10.3 mg/dL 7.7  7.9    Total Protein 6.5 - 8.1 g/dL 6.0  6.3    Total Bilirubin 0.0 - 1.2 mg/dL 89.8  88.9    Alkaline Phos 38 - 126 U/L 119  132    AST 15 - 41 U/L 94  103    ALT 0 - 44 U/L 25  27        Microbiology: Recent Results (from the past 240 hours)  Blood Culture (routine x 2)     Status: Abnormal   Collection Time: 07/07/24  1:40 PM   Specimen: BLOOD LEFT ARM  Result Value Ref Range Status   Specimen Description   Final    BLOOD LEFT ARM Performed at [facility], [address] [place], [place] [address]    Special Requests   Final    BOTTLES DRAWN AEROBIC AND ANAEROBIC Blood Culture results may not be optimal due to an inadequate volume of blood received in culture bottles Performed at [facility], [address] [place], [place] [address]    Culture  Setup Time   Final    GRAM POSITIVE COCCI IN BOTH AEROBIC AND ANAEROBIC BOTTLES CRITICAL VALUE  NOTED.  VALUE IS CONSISTENT WITH PREVIOUSLY REPORTED AND CALLED VALUE.    Culture (A)  Final    STAPHYLOCOCCUS EPIDERMIDIS SUSCEPTIBILITIES PERFORMED ON PREVIOUS CULTURE WITHIN THE LAST 5 DAYS. Performed at [facility], [address], [place], [place] [address]    Report Status 07/10/2024 FINAL  Final  Blood Culture (routine x 2)     Status: Abnormal   Collection Time: 07/07/24  1:40 PM  Specimen: BLOOD RIGHT ARM  Result Value Ref Range Status   Specimen Description   Final    BLOOD RIGHT ARM Performed at [facility], [address] [place], [place] [address]    Special Requests   Final    BOTTLES DRAWN AEROBIC AND ANAEROBIC Blood Culture results may not be optimal due to an inadequate volume of blood received in culture bottles Performed at [facility], [address] [place], [place] [address]    Culture  Setup Time   Final    IN BOTH AEROBIC AND ANAEROBIC BOTTLES GRAM POSITIVE COCCI CRITICAL RESULT CALLED TO, READ BACK BY AND VERIFIED WITH: JASON ROBBINS @ 07/08/2024 0328 AB    Culture STAPHYLOCOCCUS EPIDERMIDIS (A)  Final   Report Status 07/10/2024 FINAL  Final   Organism ID, Bacteria STAPHYLOCOCCUS EPIDERMIDIS  Final      Susceptibility   Staphylococcus epidermidis - MIC*    CIPROFLOXACIN  <=0.5 SENSITIVE Sensitive     ERYTHROMYCIN >=8 RESISTANT Resistant     GENTAMICIN <=0.5 SENSITIVE Sensitive     OXACILLIN <=0.25 SENSITIVE Sensitive     TETRACYCLINE <=1 SENSITIVE Sensitive     VANCOMYCIN 1 SENSITIVE Sensitive     TRIMETH/SULFA <=10 SENSITIVE Sensitive     CLINDAMYCIN <=0.25 SENSITIVE Sensitive     RIFAMPIN <=0.5 SENSITIVE Sensitive     Inducible Clindamycin NEGATIVE Sensitive     * STAPHYLOCOCCUS EPIDERMIDIS  Blood Culture ID Panel (Reflexed)     Status: Abnormal   Collection Time: 07/07/24  1:40 PM  Result Value Ref Range Status   Enterococcus faecalis NOT DETECTED NOT DETECTED Final   Enterococcus Faecium NOT DETECTED NOT  DETECTED Final   Listeria monocytogenes NOT DETECTED NOT DETECTED Final   Staphylococcus species DETECTED (A) NOT DETECTED Final    Comment: CRITICAL RESULT CALLED TO, READ BACK BY AND VERIFIED WITH: JASON ROBBINS @ 07/08/2024 0328 AB    Staphylococcus aureus (BCID) NOT DETECTED NOT DETECTED Final   Staphylococcus epidermidis DETECTED (A) NOT DETECTED Final    Comment: CRITICAL RESULT CALLED TO, READ BACK BY AND VERIFIED WITH: JASON ROBBINS @ 07/08/2024 0328 AB    Staphylococcus lugdunensis NOT DETECTED NOT DETECTED Final   Streptococcus species NOT DETECTED NOT DETECTED Final   Streptococcus agalactiae NOT DETECTED NOT DETECTED Final   Streptococcus pneumoniae NOT DETECTED NOT DETECTED Final   Streptococcus pyogenes NOT DETECTED NOT DETECTED Final   A.calcoaceticus-baumannii NOT DETECTED NOT DETECTED Final   Bacteroides fragilis NOT DETECTED NOT DETECTED Final   Enterobacterales NOT DETECTED NOT DETECTED Final   Enterobacter cloacae complex NOT DETECTED NOT DETECTED Final   Escherichia coli NOT DETECTED NOT DETECTED Final   Klebsiella aerogenes NOT DETECTED NOT DETECTED Final   Klebsiella oxytoca NOT DETECTED NOT DETECTED Final   Klebsiella pneumoniae NOT DETECTED NOT DETECTED Final   Proteus species NOT DETECTED NOT DETECTED Final   Salmonella species NOT DETECTED NOT DETECTED Final   Serratia marcescens NOT DETECTED NOT DETECTED Final   Haemophilus influenzae NOT DETECTED NOT DETECTED Final   Neisseria meningitidis NOT DETECTED NOT DETECTED Final   Pseudomonas aeruginosa NOT DETECTED NOT DETECTED Final   Stenotrophomonas maltophilia NOT DETECTED NOT DETECTED Final   Candida albicans NOT DETECTED NOT DETECTED Final   Candida auris NOT DETECTED NOT DETECTED Final   Candida glabrata NOT DETECTED NOT DETECTED Final   Candida krusei NOT DETECTED NOT DETECTED Final   Candida parapsilosis NOT DETECTED NOT DETECTED Final   Candida tropicalis NOT DETECTED NOT DETECTED Final  Cryptococcus neoformans/gattii NOT DETECTED NOT DETECTED Final   Methicillin resistance mecA/C NOT DETECTED NOT DETECTED Final    Comment: Performed at [facility], [address] [place], [place] [address]  MRSA Next Gen by PCR, Nasal     Status: None   Collection Time: 07/07/24  3:15 PM   Specimen: Nasal Mucosa; Nasal Swab  Result Value Ref Range Status   MRSA by PCR Next Gen NOT DETECTED NOT DETECTED Final    Comment: (NOTE) The GeneXpert MRSA Assay (FDA approved for NASAL specimens only), is one component of a comprehensive MRSA colonization surveillance program. It is not intended to diagnose MRSA infection nor to guide or monitor treatment for MRSA infections. Test performance is not FDA approved in patients less than 64 years old. Performed at [facility], [address] [place], [place] [address]   Resp panel by RT-PCR (RSV, Flu A&B, Covid) Anterior Nasal Swab     Status: None   Collection Time: 07/07/24  3:44 PM   Specimen: Anterior Nasal Swab  Result Value Ref Range Status   SARS Coronavirus 2 by RT PCR NEGATIVE NEGATIVE Final    Comment: (NOTE) SARS-CoV-2 target nucleic acids are NOT DETECTED.  The SARS-CoV-2 RNA is generally detectable in upper respiratory specimens during the acute phase of infection. The lowest concentration of SARS-CoV-2 viral copies this assay can detect is 138 copies/mL. A negative result does not preclude SARS-Cov-2 infection and should not be used as the sole basis for treatment or other patient management decisions. A negative result may occur with  improper specimen collection/handling, submission of specimen other than nasopharyngeal swab, presence of viral mutation(s) within the areas targeted by this assay, and inadequate number of viral copies(<138 copies/mL). A negative result must be combined with clinical observations, patient history, and epidemiological information. The expected result is Negative.  Fact  Sheet for Patients:  bloggercourse.com  Fact Sheet for Healthcare Providers:  seriousbroker.it  This test is no t yet approved or cleared by the [place]  FDA and  has been authorized for detection and/or diagnosis of SARS-CoV-2 by FDA under an Emergency Use Authorization (EUA). This EUA will remain  in effect (meaning this test can be used) for the duration of the COVID-19 declaration under Section 564(b)(1) of the Act, 21 U.S.C.section 360bbb-3(b)(1), unless the authorization is terminated  or revoked sooner.       Influenza A by PCR NEGATIVE NEGATIVE Final   Influenza B by PCR NEGATIVE NEGATIVE Final    Comment: (NOTE) The Xpert Xpress SARS-CoV-2/FLU/RSV plus assay is intended as an aid in the diagnosis of influenza from Nasopharyngeal swab specimens and should not be used as a sole basis for treatment. Nasal washings and aspirates are unacceptable for Xpert Xpress SARS-CoV-2/FLU/RSV testing.  Fact Sheet for Patients: bloggercourse.com  Fact Sheet for Healthcare Providers: seriousbroker.it  This test is not yet approved or cleared by the [place]  FDA and has been authorized for detection and/or diagnosis of SARS-CoV-2 by FDA under an Emergency Use Authorization (EUA). This EUA will remain in effect (meaning this test can be used) for the duration of the COVID-19 declaration under Section 564(b)(1) of the Act, 21 U.S.C. section 360bbb-3(b)(1), unless the authorization is terminated or revoked.     Resp Syncytial Virus by PCR NEGATIVE NEGATIVE Final    Comment: (NOTE) Fact Sheet for Patients: bloggercourse.com  Fact Sheet for Healthcare Providers: seriousbroker.it  This test is not yet approved or cleared by the [place]  FDA and has  been authorized for detection and/or diagnosis of SARS-CoV-2 by FDA under an  Emergency Use Authorization (EUA). This EUA will remain in effect (meaning this test can be used) for the duration of the COVID-19 declaration under Section 564(b)(1) of the Act, 21 U.S.C. section 360bbb-3(b)(1), unless the authorization is terminated or revoked.  Performed at [facility], [address] [place], [place] [address]   Respiratory (~20 pathogens) panel by PCR     Status: None   Collection Time: 07/07/24  3:44 PM   Specimen: Nasopharyngeal Swab; Respiratory  Result Value Ref Range Status   Adenovirus NOT DETECTED NOT DETECTED Final   Coronavirus 229E NOT DETECTED NOT DETECTED Final    Comment: (NOTE) The Coronavirus on the Respiratory Panel, DOES NOT test for the novel  Coronavirus (2019 nCoV)    Coronavirus HKU1 NOT DETECTED NOT DETECTED Final   Coronavirus NL63 NOT DETECTED NOT DETECTED Final   Coronavirus OC43 NOT DETECTED NOT DETECTED Final   Metapneumovirus NOT DETECTED NOT DETECTED Final   Rhinovirus / Enterovirus NOT DETECTED NOT DETECTED Final   Influenza A NOT DETECTED NOT DETECTED Final   Influenza B NOT DETECTED NOT DETECTED Final   Parainfluenza Virus 1 NOT DETECTED NOT DETECTED Final   Parainfluenza Virus 2 NOT DETECTED NOT DETECTED Final   Parainfluenza Virus 3 NOT DETECTED NOT DETECTED Final   Parainfluenza Virus 4 NOT DETECTED NOT DETECTED Final   Respiratory Syncytial Virus NOT DETECTED NOT DETECTED Final   Bordetella pertussis NOT DETECTED NOT DETECTED Final   Bordetella Parapertussis NOT DETECTED NOT DETECTED Final   Chlamydophila pneumoniae NOT DETECTED NOT DETECTED Final   Mycoplasma pneumoniae NOT DETECTED NOT DETECTED Final    Comment: Performed at [facility], [address], [place], [place] [address]  Culture, blood (Routine X 2) w Reflex to ID Panel     Status: None (Preliminary result)   Collection Time: 07/09/24 10:52 AM   Specimen: BLOOD  Result Value Ref Range Status   Specimen Description BLOOD BLOOD LEFT HAND   Final   Special Requests   Final    BOTTLES DRAWN AEROBIC ONLY Blood Culture adequate volume   Culture   Final    NO GROWTH < 24 HOURS Performed at [facility], [address], [place], [place] [address]    Report Status PENDING  Incomplete  Culture, blood (Routine X 2) w Reflex to ID Panel     Status: None (Preliminary result)   Collection Time: 07/09/24 11:07 AM   Specimen: BLOOD  Result Value Ref Range Status   Specimen Description BLOOD BLOOD LEFT HAND  Final   Special Requests   Final    BOTTLES DRAWN AEROBIC AND ANAEROBIC Blood Culture adequate volume   Culture   Final    NO GROWTH < 24 HOURS Performed at [facility], [address], [place], [place] [address]    Report Status PENDING  Incomplete    Lines and Device Date on insertion # of days DC  Central line     Foley     ETT       IMAGING RESULTS: Chest x-ray bilateral pulmonary edema I have personally reviewed the films ? Impression/Recommendation 63 year old male with history of alcohol abuse presenting with altered mental status  Metabolic encephalopathy Patient is on lactulose and  Acute hypoxic respiratory failure secondary to pulmonary edema Patient has been intubated  Bicytopenia with severe anemia and thrombocytopenia  MSSE bacteremia 4 out of 4 blood cultures have  methicillin-susceptible Staph epidermidis.  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  Discontinue that because of thrombocytopenia Changed to ceftriaxone and Flagyl  Acute hepatic steatosis secondary to alcohol With underlying possible cirrhosisand hyperlipidemia secondary to acute liver failure  Bilateral hip  replacement for avascular necrosis in his 30s  This consult involve complex antimicrobial management.  I have personally spent  --75-minutes involved in face-to-face and non-face-to-face activities for this patient on the day of the visit. Professional time spent includes the following activities: Preparing to see the patient (review of tests), Obtaining and/or reviewing separately obtained history (admission/discharge record), Performing a medically appropriate examination and/or evaluation , Ordering medications/tests/procedures, referring and communicating with other health care professionals, Documenting clinical information in the EMR, Independently interpreting results (not separately reported), Communicating results to the partner counseling and educating the partner and Care coordination with ICU team. ________________________________________________  Note:  This document was prepared using Dragon voice recognition software and may include unintentional dictation errors.

## 2024-07-10 NOTE — Progress Notes (Signed)
 Initial Nutrition Assessment  DOCUMENTATION CODES:   Not applicable  INTERVENTION:   If tube feeds initiated, recommend:  Vital 1.2@60ml /hr- Initiate at 83ml/hr and increase by 10ml q 8 hours until goal rate is reached.   Free water flushes 30ml q4 hours to maintain tube patency   Regimen provides 1728kcal/day, 108g/day protein and 1382ml/day of free water.   Thiamine 100mg  daily x 7 days   Pt at high refeed risk; recommend monitor potassium, magnesium and phosphorus labs daily until stable  Daily weights   NUTRITION DIAGNOSIS:   Inadequate oral intake related to inability to eat (pt sedated and ventilated) as evidenced by NPO status.  GOAL:   Provide needs based on ASPEN/SCCM guidelines  MONITOR:   Vent status, Labs, Weight trends, I & O's, Skin, TF tolerance  REASON FOR ASSESSMENT:   Rounds    ASSESSMENT:   63 y/o male with h/o etoh and substance abuse and withdrawal seizures who is admitted with acute toxic metabolic encephalopathy, sepsis, shock, AKI, possible colitis, alcoholic hepatitis, bacteremia, severe lactic acidosis, macrocytic anemia and pancytopenia.  Pt intubated this morning. No enteral assess noted yet. Pt has been NPO since admission and is now without adequate nutrition for > 4 days. RD will initiate tube feeds once ok per MD. Pt is at high refeed risk. Per chart, is up ~2lbs since admission.    Medications reviewed and include: colace, folic acid, lactulose, protonix, miralax, thiamine, albumin, doxycycline, levophed, zosyn, KCl, propofol, vancomycin   Labs reviewed: K 3.5 wnl, BUN 56(H), creat 1.80(H), P 3.4 wnl, Mg 2.3 wnl, tbili 10.1(H) BNP- 2360.8(H)- 10/27 Wbc- 20.6(H), Hgb 8.2(L), Hct 24.1(L) Cbgs- 99, 94, 99 x 24 hrs   Patient is currently intubated on ventilator support MV: 8.3 L/min Temp (24hrs), Avg:98 F (36.7 C), Min:97.5 F (36.4 C), Max:98.8 F (37.1 C)  Propofol: 1.95 ml/hr- provides 51kcal/day   MAP>12mmHg   UOP-    NUTRITION - FOCUSED PHYSICAL EXAM: Unable to perform at this time   Diet Order:   Diet Order             Diet NPO time specified  Diet effective now                  EDUCATION NEEDS:   No education needs have been identified at this time  Skin:  Skin Assessment: Reviewed RN Assessment (ecchymosis, petechiae)  Last BM:  10/28- via rectal tube  Height:   Ht Readings from Last 1 Encounters:  07/10/24 5' 9 (1.753 m)    Weight:   Wt Readings from Last 1 Encounters:  07/10/24 65 kg    Ideal Body Weight:  72.7 kg  BMI:  Body mass index is 21.16 kg/m.  Estimated Nutritional Needs:   Kcal:  1618kcal/day  Protein:  100-115g/day  Fluid:  1.8-2.1L/day  Augustin Shams MS, RD, LDN If unable to be reached, please send secure chat to RD inpatient available from 8:00a-4:00p daily

## 2024-07-10 NOTE — Plan of Care (Signed)
  Problem: Coping: Goal: Level of anxiety will decrease Outcome: Progressing   Problem: Pain Managment: Goal: General experience of comfort will improve and/or be controlled Outcome: Progressing   Problem: Safety: Goal: Ability to remain free from injury will improve Outcome: Progressing   Problem: Skin Integrity: Goal: Risk for impaired skin integrity will decrease Outcome: Progressing   Problem: Activity: Goal: Ability to tolerate increased activity will improve Outcome: Progressing   Problem: Respiratory: Goal: Ability to maintain a clear airway and adequate ventilation will improve Outcome: Progressing

## 2024-07-11 ENCOUNTER — Encounter: Admission: EM | Disposition: A | Payer: Self-pay | Source: Home / Self Care | Attending: Internal Medicine

## 2024-07-11 ENCOUNTER — Encounter
Admission: EM | Disposition: A | Discharge status: Hospice | Payer: Self-pay | Source: Home / Self Care | Attending: Pulmonary Disease

## 2024-07-11 DIAGNOSIS — R7881 Bacteremia: Secondary | ICD-10-CM

## 2024-07-11 DIAGNOSIS — R4182 Altered mental status, unspecified: Secondary | ICD-10-CM

## 2024-07-11 DIAGNOSIS — J9601 Acute respiratory failure with hypoxia: Secondary | ICD-10-CM

## 2024-07-11 DIAGNOSIS — K769 Liver disease, unspecified: Secondary | ICD-10-CM

## 2024-07-11 DIAGNOSIS — Z515 Encounter for palliative care: Secondary | ICD-10-CM

## 2024-07-11 DIAGNOSIS — D72829 Elevated white blood cell count, unspecified: Secondary | ICD-10-CM

## 2024-07-11 DIAGNOSIS — B957 Other staphylococcus as the cause of diseases classified elsewhere: Secondary | ICD-10-CM

## 2024-07-11 DIAGNOSIS — R6520 Severe sepsis without septic shock: Secondary | ICD-10-CM

## 2024-07-11 LAB — CBC WITH DIFFERENTIAL/PLATELET
Abs Immature Granulocytes: 0.8 K/uL — ABNORMAL HIGH (ref 0.00–0.07)
Abs Immature Granulocytes: 1.21 K/uL — ABNORMAL HIGH (ref 0.00–0.07)
Basophils Absolute: 0.1 K/uL (ref 0.0–0.1)
Basophils Absolute: 0.1 K/uL (ref 0.0–0.1)
Basophils Relative: 0 %
Basophils Relative: 0 %
Eosinophils Absolute: 0.2 K/uL (ref 0.0–0.5)
Eosinophils Absolute: 0 K/uL (ref 0.0–0.5)
Eosinophils Relative: 1 %
Eosinophils Relative: 0 %
HCT: 21.7 % — ABNORMAL LOW (ref 39.0–52.0)
HCT: 20.9 % — ABNORMAL LOW (ref 39.0–52.0)
Hemoglobin: 7.2 g/dL — ABNORMAL LOW (ref 13.0–17.0)
Hemoglobin: 6.8 g/dL — ABNORMAL LOW (ref 13.0–17.0)
Immature Granulocytes: 3 %
Immature Granulocytes: 4 %
Lymphocytes Relative: 16 %
Lymphocytes Relative: 7 %
Lymphs Abs: 4.1 K/uL — ABNORMAL HIGH (ref 0.7–4.0)
Lymphs Abs: 1.8 K/uL (ref 0.7–4.0)
MCH: 33.6 pg (ref 26.0–34.0)
MCH: 33.3 pg (ref 26.0–34.0)
MCHC: 33.2 g/dL (ref 30.0–36.0)
MCHC: 32.5 g/dL (ref 30.0–36.0)
MCV: 101.4 fL — ABNORMAL HIGH (ref 80.0–100.0)
MCV: 102.5 fL — ABNORMAL HIGH (ref 80.0–100.0)
Monocytes Absolute: 2.1 K/uL — ABNORMAL HIGH (ref 0.1–1.0)
Monocytes Absolute: 1.4 K/uL — ABNORMAL HIGH (ref 0.1–1.0)
Monocytes Relative: 8 %
Monocytes Relative: 5 %
Neutro Abs: 18.7 K/uL — ABNORMAL HIGH (ref 1.7–7.7)
Neutro Abs: 23.3 K/uL — ABNORMAL HIGH (ref 1.7–7.7)
Neutrophils Relative %: 72 %
Neutrophils Relative %: 84 %
Platelets: 20 K/uL — CL (ref 150–400)
Platelets: 53 K/uL — ABNORMAL LOW (ref 150–400)
RBC: 2.14 MIL/uL — ABNORMAL LOW (ref 4.22–5.81)
RBC: 2.04 MIL/uL — ABNORMAL LOW (ref 4.22–5.81)
RDW: 19.8 % — ABNORMAL HIGH (ref 11.5–15.5)
RDW: 19.5 % — ABNORMAL HIGH (ref 11.5–15.5)
Smear Review: NORMAL
WBC: 26 K/uL — ABNORMAL HIGH (ref 4.0–10.5)
WBC: 27.9 K/uL — ABNORMAL HIGH (ref 4.0–10.5)
nRBC: 0.4 % — ABNORMAL HIGH (ref 0.0–0.2)
nRBC: 0.1 % (ref 0.0–0.2)

## 2024-07-11 LAB — CBC
HCT: 21.6 % — ABNORMAL LOW (ref 39.0–52.0)
HCT: 22.8 % — ABNORMAL LOW (ref 39.0–52.0)
Hemoglobin: 7.2 g/dL — ABNORMAL LOW (ref 13.0–17.0)
Hemoglobin: 7.4 g/dL — ABNORMAL LOW (ref 13.0–17.0)
MCH: 32.9 pg (ref 26.0–34.0)
MCH: 33.8 pg (ref 26.0–34.0)
MCHC: 32.5 g/dL (ref 30.0–36.0)
MCHC: 33.3 g/dL (ref 30.0–36.0)
MCV: 101.3 fL — ABNORMAL HIGH (ref 80.0–100.0)
MCV: 101.4 fL — ABNORMAL HIGH (ref 80.0–100.0)
Platelets: 20 K/uL — CL (ref 150–400)
Platelets: 21 K/uL — CL (ref 150–400)
RBC: 2.13 MIL/uL — ABNORMAL LOW (ref 4.22–5.81)
RBC: 2.25 MIL/uL — ABNORMAL LOW (ref 4.22–5.81)
RDW: 19.7 % — ABNORMAL HIGH (ref 11.5–15.5)
RDW: 20 % — ABNORMAL HIGH (ref 11.5–15.5)
WBC: 25.2 K/uL — ABNORMAL HIGH (ref 4.0–10.5)
WBC: 25.8 K/uL — ABNORMAL HIGH (ref 4.0–10.5)
nRBC: 0.4 % — ABNORMAL HIGH (ref 0.0–0.2)
nRBC: 0.5 % — ABNORMAL HIGH (ref 0.0–0.2)

## 2024-07-11 LAB — GLUCOSE, CAPILLARY
Glucose-Capillary: 139 mg/dL — ABNORMAL HIGH (ref 70–99)
Glucose-Capillary: 150 mg/dL — ABNORMAL HIGH (ref 70–99)
Glucose-Capillary: 162 mg/dL — ABNORMAL HIGH (ref 70–99)
Glucose-Capillary: 163 mg/dL — ABNORMAL HIGH (ref 70–99)
Glucose-Capillary: 175 mg/dL — ABNORMAL HIGH (ref 70–99)
Glucose-Capillary: 184 mg/dL — ABNORMAL HIGH (ref 70–99)

## 2024-07-11 LAB — LACTIC ACID, PLASMA
Lactic Acid, Venous: 2.3 mmol/L (ref 0.5–1.9)
Lactic Acid, Venous: 2.8 mmol/L (ref 0.5–1.9)

## 2024-07-11 LAB — COMPREHENSIVE METABOLIC PANEL WITH GFR
ALT: 23 U/L (ref 0–44)
AST: 95 U/L — ABNORMAL HIGH (ref 15–41)
Albumin: 2.2 g/dL — ABNORMAL LOW (ref 3.5–5.0)
Alkaline Phosphatase: 114 U/L (ref 38–126)
Anion gap: 18 — ABNORMAL HIGH (ref 5–15)
BUN: 62 mg/dL — ABNORMAL HIGH (ref 8–23)
CO2: 21 mmol/L — ABNORMAL LOW (ref 22–32)
Calcium: 7.9 mg/dL — ABNORMAL LOW (ref 8.9–10.3)
Chloride: 99 mmol/L (ref 98–111)
Creatinine, Ser: 2.08 mg/dL — ABNORMAL HIGH (ref 0.61–1.24)
GFR, Estimated: 35 mL/min — ABNORMAL LOW (ref >60)
Glucose, Bld: 157 mg/dL — ABNORMAL HIGH (ref 70–99)
Potassium: 3.7 mmol/L (ref 3.5–5.1)
Sodium: 138 mmol/L (ref 135–145)
Total Bilirubin: 12.1 mg/dL — ABNORMAL HIGH (ref 0.0–1.2)
Total Protein: 6.4 g/dL — ABNORMAL LOW (ref 6.5–8.1)

## 2024-07-11 LAB — COOXEMETRY PANEL
Carboxyhemoglobin: 1.3 % (ref 0.5–1.5)
Carboxyhemoglobin: 1.1 % (ref 0.5–1.5)
Methemoglobin: <0.7 % (ref 0.0–1.5)
Methemoglobin: 0.9 % (ref 0.0–1.5)
O2 Saturation: 65.3 %
O2 Saturation: 88 %
Total hemoglobin: 7.8 g/dL — ABNORMAL LOW (ref 12.0–16.0)
Total hemoglobin: 8.5 g/dL — ABNORMAL LOW (ref 12.0–16.0)
Total oxygen content: 64.5 %
Total oxygen content: 86.2 %

## 2024-07-11 LAB — RENAL FUNCTION PANEL
Albumin: 2.1 g/dL — ABNORMAL LOW (ref 3.5–5.0)
Anion gap: 19 — ABNORMAL HIGH (ref 5–15)
BUN: 60 mg/dL — ABNORMAL HIGH (ref 8–23)
CO2: 20 mmol/L — ABNORMAL LOW (ref 22–32)
Calcium: 8 mg/dL — ABNORMAL LOW (ref 8.9–10.3)
Chloride: 101 mmol/L (ref 98–111)
Creatinine, Ser: 1.96 mg/dL — ABNORMAL HIGH (ref 0.61–1.24)
GFR, Estimated: 37 mL/min — ABNORMAL LOW (ref >60)
Glucose, Bld: 152 mg/dL — ABNORMAL HIGH (ref 70–99)
Phosphorus: 3.8 mg/dL (ref 2.5–4.6)
Potassium: 4 mmol/L (ref 3.5–5.1)
Sodium: 140 mmol/L (ref 135–145)

## 2024-07-11 LAB — BPAM PLATELET PHERESIS
Blood Product Expiration Date: 202510292359
ISSUE DATE / TIME: 202510281118
Unit Type and Rh: 5100

## 2024-07-11 LAB — PREPARE PLATELET PHERESIS: Unit division: 0

## 2024-07-11 LAB — PHOSPHORUS: Phosphorus: 3.5 mg/dL (ref 2.5–4.6)

## 2024-07-11 LAB — PROTIME-INR
INR: 1.6 — ABNORMAL HIGH (ref 0.8–1.2)
Prothrombin Time: 19.7 s — ABNORMAL HIGH (ref 11.4–15.2)

## 2024-07-11 LAB — PREPARE RBC (CROSSMATCH)

## 2024-07-11 LAB — TRIGLYCERIDES: Triglycerides: 148 mg/dL (ref <150)

## 2024-07-11 LAB — VANCOMYCIN, RANDOM: Vancomycin Rm: 28 ug/mL

## 2024-07-11 LAB — MAGNESIUM: Magnesium: 2.2 mg/dL (ref 1.7–2.4)

## 2024-07-11 LAB — FIBRINOGEN: Fibrinogen: 237 mg/dL (ref 210–475)

## 2024-07-11 LAB — LIPASE, BLOOD: Lipase: 17 U/L (ref 11–51)

## 2024-07-11 SURGERY — ECHOCARDIOGRAM, TRANSESOPHAGEAL
Anesthesia: General

## 2024-07-11 SURGERY — ECHOCARDIOGRAM, TRANSESOPHAGEAL
Anesthesia: Moderate Sedation

## 2024-07-11 MED ORDER — HYDROCORTISONE SOD SUC (PF) 100 MG IJ SOLR
50.0000 mg | Freq: Three times a day (TID) | INTRAMUSCULAR | Status: DC
  Administered 2024-07-11 – 2024-07-14 (×9): 50 mg via INTRAVENOUS
  Filled 2024-07-11 (×9): qty 2

## 2024-07-11 MED ORDER — SODIUM CHLORIDE 0.9% IV SOLUTION: Freq: Once | INTRAVENOUS | Status: AC

## 2024-07-11 MED ORDER — ORAL CARE MOUTH RINSE
15.0000 mL | OROMUCOSAL | Status: DC
  Administered 2024-07-11 – 2024-07-14 (×30): 15 mL via OROMUCOSAL

## 2024-07-11 MED ORDER — LACTATED RINGERS IV BOLUS
500.0000 mL | Freq: Once | INTRAVENOUS | Status: AC
  Administered 2024-07-11: 500 mL via INTRAVENOUS

## 2024-07-11 MED ORDER — VANCOMYCIN VARIABLE DOSE PER UNSTABLE RENAL FUNCTION (PHARMACIST DOSING): Status: DC

## 2024-07-11 NOTE — TOC Initial Note (Signed)
 Transition of Care [facility]) - Initial/Assessment Note    Patient Details  Name: Sergio Curry MRN: [id number] Date of Birth: 06-12-1961  Transition of Care [facility]) CM/SW Contact:    Corrie JINNY Ruts, LCSW Phone Number: 07/11/2024, 11:58 AM  Clinical Narrative:                 Chart reviewed. The patient was admitted for acute metabolic encephalopathy. The patient is currently intubated. The patient does not have any children or siblings. Per nurse the patient has a significant other of 20 years, [name] Silvan. Ms. Silvan is the only person on the patient chart.  I was able to speak with Ms. Silvan at bedside today. I introduced my self, my role, and reason for consult. The patient significant other reports that she is the only person who lives with the patient. The patient significant other reports that that the patient does not have a PCP. SW offered PCP resources. The patient significant other reports that she does not think the patient would care enough to have a PCP because has not been to the doctor in a while.   The patient significant other reports that the patient was able to complete daily living task independently before being admitted to the hospital. The patient significant other the patient is able to drive himself to medical appointments. The patient significant other reports that she will assist the patient at D/C. The patient significant reports that the patient uses [facility] in [place]. The patient significant other reports that the patient has not had HH or been admitted into a SNF in the past. The patient significant other reports that the patient does not have any equipment in the home.   SW asked the patient significant other if the patient would like substance use resources. 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 The patient significant other was a little tearful.   There are no other TOC needs at this time. TOC will follow  the patient until D/C.     Barriers to Discharge: Continued Medical Work up   Patient Goals and CMS Choice            Expected Discharge Plan and Services                                              Prior Living Arrangements/Services   Lives with:: Significant Other Patient language and need for interpreter reviewed:: Yes        Need for Family Participation in Patient Care: Yes (Comment)     Criminal Activity/Legal Involvement Pertinent to Current Situation/Hospitalization: No - Comment as needed  Activities of Daily Living      Permission Sought/Granted   Permission granted to share information with : Yes, Release of Information Signed        Permission granted to share info w Relationship: Significant other  Permission granted to share info w Contact Information: [name] Silvan; [phone]  Emotional Assessment Appearance:: Appears stated age Attitude/Demeanor/Rapport: Unable to Assess Affect (typically observed): Unable to Assess   Alcohol / Substance Use: Alcohol Use    Admission diagnosis:  Acute metabolic encephalopathy [G93.41] Patient Active Problem List   Diagnosis Date Noted   Acute metabolic encephalopathy 07/07/2024   PCP:  Patient, No Pcp Per Pharmacy:   [id number] - [place], [place] - [address] [place] [address] Phone: [phone] Fax: [phone]  [id number] [place], [place] - [address] [place] [address] Phone: [phone] Fax: [phone]     Social Drivers of Health (SDOH) Social History: SDOH Screenings   Food Insecurity: No Food Insecurity (07/07/2024)  Housing: Low Risk  (07/07/2024)  Transportation Needs: No Transportation Needs (07/07/2024)  Utilities: Not At Risk (07/07/2024)  Tobacco Use: Medium Risk (05/21/2020)   SDOH Interventions:     Readmission Risk Interventions     No data to display

## 2024-07-11 NOTE — Progress Notes (Signed)
 Lab called to report a critical Lactic 2.3. Made ICU provider aware.

## 2024-07-11 NOTE — Progress Notes (Addendum)
 [facility] CLINIC CARDIOLOGY PROGRESS NOTE       Patient ID: Sergio Curry MRN: [id number] DOB/AGE: 1961/05/18 63 y.o.  Admit date: 07/07/2024 Referring Physician Lonell Moose, NP Primary Physician Patient, No Pcp Per  Primary Cardiologist None Reason for Consultation Possible SBE  HPI: Sergio Curry is a 63 y.o. male  with a past medical history of alcohol use, liver disease who presented to the ED on 07/07/2024 for altered mental status and jaundice. Echo this admission with concern for SBE of anterior leaflet of aortic valve. Cardiology was consulted for further evaluation.   Interval history: -Patient seen and examined this AM, intubated yesterday evening. -Platelets slightly improved after transfusion yesterday. -Plan for TEE today to evaluate for endocarditis.  Review of systems complete and found to be negative unless listed above    Past Medical History:  Diagnosis Date   Seizures (HCC)     No past surgical history on file.  No medications prior to admission.   Social History   Socioeconomic History   Marital status: Single    Spouse name: Not on file   Number of children: Not on file   Years of education: Not on file   Highest education level: Not on file  Occupational History   Not on file  Tobacco Use   Smoking status: Former    Types: Cigarettes   Smokeless tobacco: Never  Substance and Sexual Activity   Alcohol use: Yes    Alcohol/week: 7.0 standard drinks of alcohol    Types: 4 Cans of beer, 3 Shots of liquor per week   Drug use: Yes    Types: Marijuana   Sexual activity: Not Currently    Birth control/protection: None  Other Topics Concern   Not on file  Social History Narrative   Not on file   Social Drivers of Health   Financial Resource Strain: Not on file  Food Insecurity: No Food Insecurity (07/07/2024)   Hunger Vital Sign    Worried About Running Out of Food in the Last Year: Never true    Ran Out of Food in the Last Year: Never  true  Transportation Needs: No Transportation Needs (07/07/2024)   PRAPARE - Administrator, Civil Service (Medical): No    Lack of Transportation (Non-Medical): No  Physical Activity: Not on file  Stress: Not on file  Social Connections: Not on file  Intimate Partner Violence: Not At Risk (07/07/2024)   Humiliation, Afraid, Rape, and Kick questionnaire    Fear of Current or Ex-Partner: No    Emotionally Abused: No    Physically Abused: No    Sexually Abused: No    No family history on file.   Vitals:   07/11/24 0530 07/11/24 0545 07/11/24 0600 07/11/24 0754  BP: (!) 126/39 (!) 124/39 (!) 124/40   Pulse: 93 92 91   Resp: (!) 23 (!) 23 (!) 23   Temp:      TempSrc:      SpO2: 95% 96% 95% 98%  Weight:      Height:        PHYSICAL EXAM General: Intubated, sedated. HEENT: Normocephalic and atraumatic. Neck: No JVD.  Lungs: Mechanical breath sounds. Heart: HRRR. Normal S1 and S2 without gallops or murmurs.  Abdomen: Non-distended appearing.  Msk: Normal strength and tone for age. Extremities: Warm and well perfused. No clubbing, cyanosis. No edema.   Labs: Basic Metabolic Panel: Recent Labs    07/10/24 0313 07/11/24 0005 07/11/24 0511  NA 138  140 138  K 3.5 4.0 3.7  CL 101 101 99  CO2 21* 20* 21*  GLUCOSE 106* 152* 157*  BUN 56* 60* 62*  CREATININE 1.80* 1.96* 2.08*  CALCIUM 7.7* 8.0* 7.9*  MG 2.3  --  2.2  PHOS 3.4 3.8 3.5   Liver Function Tests: Recent Labs    07/10/24 0313 07/11/24 0005 07/11/24 0511  AST 94*  --  95*  ALT 25  --  23  ALKPHOS 119  --  114  BILITOT 10.1*  --  12.1*  PROT 6.0*  --  6.4*  ALBUMIN 1.6* 2.1* 2.2*   No results for input(s): LIPASE, AMYLASE in the last 72 hours.  CBC: Recent Labs    07/09/24 1052 07/10/24 0313 07/11/24 0005 07/11/24 0511  WBC 17.8*   < > 25.2* 25.8*  NEUTROABS 13.4*  --   --   --   HGB 8.3*   < > 7.4* 7.2*  HCT 24.6*   < > 22.8* 21.6*  MCV 98.8   < > 101.3* 101.4*  PLT 13*   < >  21* 20*   < > = values in this interval not displayed.   Cardiac Enzymes: No results for input(s): CKTOTAL, CKMB, CKMBINDEX, TROPONINIHS in the last 72 hours.  BNP: Recent Labs    07/09/24 1841  BNP 2,360.8*   D-Dimer: Recent Labs    07/09/24 1052  DDIMER 2.68*   Hemoglobin A1C: No results for input(s): HGBA1C in the last 72 hours. Fasting Lipid Panel: Recent Labs    07/11/24 0511  TRIG 148   Thyroid Function Tests: No results for input(s): TSH, T4TOTAL, T3FREE, THYROIDAB in the last 72 hours.  Invalid input(s): FREET3 Anemia Panel: No results for input(s): VITAMINB12, FOLATE, FERRITIN, TIBC, IRON, RETICCTPCT in the last 72 hours.    Radiology: DG Abd 1 View Result Date: 07/10/2024 CLINICAL DATA:  NG placement. EXAM: ABDOMEN - 1 VIEW COMPARISON:  CT abdomen pelvis dated 07/07/2024. FINDINGS: Enteric tube with tip and side-port in the left upper abdomen the proximal stomach. No bowel dilatation. No acute osseous pathology. IMPRESSION: Enteric tube with tip and side-port in the proximal stomach. Electronically Signed   By: Vanetta Chou M.D.   On: 07/10/2024 14:36   DG Chest Port 1 View Result Date: 07/10/2024 CLINICAL DATA:  Central line placement. EXAM: PORTABLE CHEST 1 VIEW COMPARISON:  Chest radiograph dated 07/10/2024. FINDINGS: Left IJ central venous line with tip over central SVC. Endotracheal tube approximately 5 cm above the carina and enteric tube extends below diaphragm with tip beyond the inferior margin of the image. Bilateral pulmonary opacities as seen on the earlier radiograph. No pneumothorax. Stable cardiac silhouette. No acute osseous pathology. IMPRESSION: Left IJ central venous line with tip over central SVC. No pneumothorax. Electronically Signed   By: Vanetta Chou M.D.   On: 07/10/2024 14:34   DG Chest Port 1 View Result Date: 07/10/2024 EXAM: 1 VIEW XRAY OF THE CHEST 07/10/2024 11:13:29 AM COMPARISON: Same day.  CLINICAL HISTORY: [id number] Encounter for intubation [id number]. Encounter for intubation Encounter for intubation FINDINGS: LINES, TUBES AND DEVICES: Endotracheal tube tip is seen 1 cm above the carina and directed slightly towards the right mainstem bronchus. Withdrawal by 2 to 3 cm is recommended. LUNGS AND PLEURA: Opacities are again noted consistent with pneumonia or possibly edema. No pleural effusion. No pneumothorax. HEART AND MEDIASTINUM: No acute abnormality of the cardiac and mediastinal silhouettes. BONES AND SOFT TISSUES: No acute osseous abnormality. IMPRESSION: 1. Endotracheal  tube tip 1 cm above the carina with slight right mainstem orientation; recommend withdrawal by 23 cm. 2. Opacities consistent with pneumonia or edema. Electronically signed by: Lynwood Seip MD 07/10/2024 11:30 AM EDT RP Workstation: [id number]   DG Chest Port 1 View Result Date: 07/10/2024 EXAM: 1 VIEW(S) XRAY OF THE CHEST 07/10/2024 03:26:54 AM COMPARISON: Chest x-ray dated 11/10/2023. CLINICAL HISTORY: 200808 Hypoxia FINDINGS: LUNGS AND PLEURA: Interval increase in bilateral perihilar, right greater than left, streaky consolidative airspace opacity. Left upper lobe patchy airspace opacities. Blunting of the right costophrenic angle with likely underlying trace pleural effusion. Left costophrenic angle is collimated off view. No pulmonary edema. No pneumothorax. HEART AND MEDIASTINUM: No acute abnormality of the cardiac and mediastinal silhouettes. BONES AND SOFT TISSUES: No acute osseous abnormality. IMPRESSION: 1. Interval increase in bilateral perihilar, right greater than left, consolidative and patchy airspace opacities, most consistent with multifocal pneumonia or aspiration pneumonitis. 2. Blunting of the right costophrenic angle with likely trace pleural effusion. Electronically signed by: Morgane Naveau MD 07/10/2024 03:47 AM EDT RP Workstation: [id number]   DG Chest Port 1 View Result Date: 07/09/2024 EXAM: 1 VIEW(S)  XRAY OF THE CHEST 07/09/2024 04:45:00 PM COMPARISON: Chest x-ray 07/07/2024. CLINICAL HISTORY: [id number] Dyspnea [id number]. Dyspnea FINDINGS: LUNGS AND PLEURA: There is new bilateral multifocal airspace consolidation, right greater than left. This is predominantly central in location. No pulmonary edema. No pleural effusion. No pneumothorax. HEART AND MEDIASTINUM: No acute abnormality of the cardiac and mediastinal silhouettes. BONES AND SOFT TISSUES: There are multiple healed left-sided rib fractures. IMPRESSION: 1. New bilateral multifocal airspace consolidation, right greater than left, predominantly central in location. Electronically signed by: Greig Pique MD 07/09/2024 09:42 PM EDT RP Workstation: [id number]   MR CARDIAC MORPHOLOGY W WO CONTRAST Result Date: 07/09/2024 CLINICAL DATA:  Aortic regurgitation, possible aortic vegetation EXAM: CARDIAC MRI TECHNIQUE: The patient was scanned on a 1.5 Tesla Siemens magnet. A dedicated cardiac coil was used. Functional imaging was done using Fiesta sequences. 2,3, and 4 chamber views were done to assess for RWMA's. Modified Simpson's rule using a short axis stack was used to calculate an ejection fraction on a dedicated work [employer]. The patient received 8 cc of Gadavist. After 10 minutes inversion recovery sequences were used to assess for infiltration and scar tissue. Velocity flow mapping performed in the ascending aorta and main pulmonary artery. CONTRAST:  8 cc  of Gadavist FINDINGS: 1. Normal left ventricular size and thickness. Mildly reduced LV systolic function (LVEF = 45%). There is global hypokinesis. There is no late gadolinium enhancement in the left ventricular myocardium. LVEDV: 172 ml LVESV: 94 ml SV: 77 ml CO: 4 L/min Myocardial mass: 90 g 2. Normal right ventricular size, thickness and low normal systolic function. There are no regional wall motion abnormalities. 3.  Normal left and right atrial size. 4. Normal size of the aortic  root, ascending aorta and pulmonary artery. 5. Aortic valve prolapse, moderate aortic valve regurgitation. Regurgitant volume 51 ml, regurgitant fraction 53%. 6.  Normal pericardium.  No pericardial effusion. IMPRESSION: 1.  Mildly reduced LV systolic function.  LVEF 45% 2.  No LGE or scar. 3.  Aortic valve prolapse and moderate aortic regurgitation. 4.  Poor image quality, cannot rule out aortic valve vegetation. 5.  Normal RV systolic function. Electronically Signed   By: Redell Cave M.D.   On: 07/09/2024 17:11   MR CARDIAC VELOCITY FLOW MAP Result Date: 07/09/2024 CLINICAL DATA:  Aortic regurgitation, possible aortic vegetation EXAM: CARDIAC  MRI TECHNIQUE: The patient was scanned on a 1.5 Tesla Siemens magnet. A dedicated cardiac coil was used. Functional imaging was done using Fiesta sequences. 2,3, and 4 chamber views were done to assess for RWMA's. Modified Simpson's rule using a short axis stack was used to calculate an ejection fraction on a dedicated work [employer]. The patient received 8 cc of Gadavist. After 10 minutes inversion recovery sequences were used to assess for infiltration and scar tissue. Velocity flow mapping performed in the ascending aorta and main pulmonary artery. CONTRAST:  8 cc  of Gadavist FINDINGS: 1. Normal left ventricular size and thickness. Mildly reduced LV systolic function (LVEF = 45%). There is global hypokinesis. There is no late gadolinium enhancement in the left ventricular myocardium. LVEDV: 172 ml LVESV: 94 ml SV: 77 ml CO: 4 L/min Myocardial mass: 90 g 2. Normal right ventricular size, thickness and low normal systolic function. There are no regional wall motion abnormalities. 3.  Normal left and right atrial size. 4. Normal size of the aortic root, ascending aorta and pulmonary artery. 5. Aortic valve prolapse, moderate aortic valve regurgitation. Regurgitant volume 51 ml, regurgitant fraction 53%. 6.  Normal pericardium.  No pericardial  effusion. IMPRESSION: 1.  Mildly reduced LV systolic function.  LVEF 45% 2.  No LGE or scar. 3.  Aortic valve prolapse and moderate aortic regurgitation. 4.  Poor image quality, cannot rule out aortic valve vegetation. 5.  Normal RV systolic function. Electronically Signed   By: Redell Cave M.D.   On: 07/09/2024 17:11   MR CARDIAC VELOCITY FLOW MAP Result Date: 07/09/2024 CLINICAL DATA:  Aortic regurgitation, possible aortic vegetation EXAM: CARDIAC MRI TECHNIQUE: The patient was scanned on a 1.5 Tesla Siemens magnet. A dedicated cardiac coil was used. Functional imaging was done using Fiesta sequences. 2,3, and 4 chamber views were done to assess for RWMA's. Modified Simpson's rule using a short axis stack was used to calculate an ejection fraction on a dedicated work [employer]. The patient received 8 cc of Gadavist. After 10 minutes inversion recovery sequences were used to assess for infiltration and scar tissue. Velocity flow mapping performed in the ascending aorta and main pulmonary artery. CONTRAST:  8 cc  of Gadavist FINDINGS: 1. Normal left ventricular size and thickness. Mildly reduced LV systolic function (LVEF = 45%). There is global hypokinesis. There is no late gadolinium enhancement in the left ventricular myocardium. LVEDV: 172 ml LVESV: 94 ml SV: 77 ml CO: 4 L/min Myocardial mass: 90 g 2. Normal right ventricular size, thickness and low normal systolic function. There are no regional wall motion abnormalities. 3.  Normal left and right atrial size. 4. Normal size of the aortic root, ascending aorta and pulmonary artery. 5. Aortic valve prolapse, moderate aortic valve regurgitation. Regurgitant volume 51 ml, regurgitant fraction 53%. 6.  Normal pericardium.  No pericardial effusion. IMPRESSION: 1.  Mildly reduced LV systolic function.  LVEF 45% 2.  No LGE or scar. 3.  Aortic valve prolapse and moderate aortic regurgitation. 4.  Poor image quality, cannot rule out aortic  valve vegetation. 5.  Normal RV systolic function. Electronically Signed   By: Redell Cave M.D.   On: 07/09/2024 17:11   ECHOCARDIOGRAM COMPLETE Result Date: 07/08/2024    ECHOCARDIOGRAM REPORT   Patient Name:   Sergio Curry Date of Exam: 07/08/2024 Medical Rec #:  [id number]        Height:       69.0 in Accession #:  [id number]       Weight:       129.4 lb Date of Birth:  06-06-1961         BSA:          1.717 m Patient Age:    63 years         BP:           133/32 mmHg Patient Gender: M                HR:           84 bpm. Exam Location:  [facility] Procedure: 2D Echo, Cardiac Doppler and Color Doppler (Both Spectral and Color            Flow Doppler were utilized during procedure). Indications:     Elevated Troponin  History:         Patient has no prior history of Echocardiogram examinations.  Sonographer:     Thedora Louder RDCS, FASE Referring Phys:  [id number] LONELL KANDICE MOOSE Diagnosing Phys: Cara JONETTA Lovelace MD  Sonographer Comments: Technically difficult study due to poor echo windows. Image acquisition challenging due to patient behavioral factors. IMPRESSIONS  1. Possible SBE of anterior leaflet. Recommend TEE.  2. Technically difficult study.  3. Left ventricular ejection fraction, by estimation, is 45 to 50%. The left ventricle has mildly decreased function. The left ventricle demonstrates global hypokinesis. The left ventricular internal cavity size was moderately to severely dilated. Left ventricular diastolic function could not be evaluated.  4. Right ventricular systolic function is normal. The right ventricular size is normal.  5. Left atrial size was mildly dilated.  6. The mitral valve is myxomatous. Trivial mitral valve regurgitation.  7. 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 SBE can not be excluded. Consider TEE.. The aortic valve is calcified. Aortic valve regurgitation is mild to moderate. Aortic valve sclerosis/calcification is present, without any  evidence of aortic stenosis. Conclusion(s)/Recommendation(s): Poor windows for evaluation of left ventricular function by transthoracic echocardiography. Would recommend an alternative means of evaluation. Findings concerning for aortic valve vegetation, would recommend a Transesophageal Echocardiogram for clarification. FINDINGS  Left Ventricle: Left ventricular ejection fraction, by estimation, is 45 to 50%. The left ventricle has mildly decreased function. The left ventricle demonstrates global hypokinesis. Strain was performed and the global longitudinal strain is indeterminate. The left ventricular internal cavity size was moderately to severely dilated. There is no left ventricular hypertrophy. Left ventricular diastolic function could not be evaluated. Right Ventricle: The right ventricular size is normal. No increase in right ventricular wall thickness. Right ventricular systolic function is normal. Left Atrium: Left atrial size was mildly dilated. Right Atrium: Right atrial size was normal in size. Pericardium: There is no evidence of pericardial effusion. Mitral Valve: The mitral valve is myxomatous. Trivial mitral valve regurgitation. Tricuspid Valve: The tricuspid valve is normal in structure. Tricuspid valve regurgitation is trivial. Aortic Valve: Mobile mass on anterior leaflet prolapsing inyo the outflow tract. SBE can not be excluded. Consider TEE. The aortic valve is calcified. Aortic valve regurgitation is mild to moderate. Aortic valve sclerosis/calcification is present, without any evidence of aortic stenosis. Aortic valve peak gradient measures 8.2 mmHg. Pulmonic Valve: The pulmonic valve was normal in structure. Pulmonic valve regurgitation is not visualized. Aorta: The ascending aorta was not well visualized. IAS/Shunts: No atrial level shunt detected by color flow Doppler. Additional Comments: Possible SBE of anterior leaflet. Recommend TEE. Technically difficult study. 3D was performed not  requiring  image post processing on an independent workstation and was indeterminate.  LEFT VENTRICLE PLAX 2D LVIDd:         6.10 cm LVIDs:         4.70 cm LV PW:         1.10 cm LV IVS:        1.00 cm  LV Volumes (MOD) LV vol d, MOD A4C: 90.4 ml LV vol s, MOD A4C: 29.6 ml LV SV MOD A4C:     90.4 ml LEFT ATRIUM           Index LA diam:      4.10 cm 2.39 cm/m LA Vol (A4C): 26.0 ml 15.14 ml/m  AORTIC VALVE              PULMONIC VALVE AV Vmax:      143.00 cm/s RVOT Peak grad: 1 mmHg AV Peak Grad: 8.2 mmHg LVOT Vmax:    91.80 cm/s LVOT Vmean:   62.000 cm/s LVOT VTI:     0.196 m  AORTA Ao Asc diam: 3.10 cm  SHUNTS Systemic VTI: 0.20 m Cara JONETTA Lovelace MD Electronically signed by Cara JONETTA Lovelace MD Signature Date/Time: 07/08/2024/11:40:39 AM    Final    US  ABDOMEN LIMITED WITH LIVER DOPPLER Result Date: 07/08/2024 CLINICAL DATA:  63 year old male with history of hyperbilirubinemia. EXAM: DUPLEX ULTRASOUND OF LIVER TECHNIQUE: Color and duplex Doppler ultrasound was performed to evaluate the hepatic in-flow and out-flow vessels. COMPARISON:  None Available. FINDINGS: Liver: Diffusely coarsened echotexture with increased echogenicity normal hepatic contour without nodularity. No focal lesion, mass or intrahepatic biliary ductal dilatation. Main Portal Vein size: 0.83 cm Portal Vein Velocities Main Prox:  23 cm/sec, antegrade Main Dist:  23 cm/sec, antegrade Right: Not visualized. Left: Not visualized. Hepatic Vein Velocities Right: Not visualized. Middle:  14.5 cm/sec Left: Not visualized. IVC: Present and patent with normal respiratory phasicity. Hepatic Artery Velocity:  216 cm/sec Splenic Vein Velocity: Not visualized. Spleen: 8.8 cm x 8.1 cm x 3.6 cm with a total volume of 134 cm^3 (411 cm^3 is upper limit normal) Portal Vein Occlusion/Thrombus: No Splenic Vein Occlusion/Thrombus: No Ascites: None Varices: None IMPRESSION: 1. Limited evaluation. The visualized portal vasculature is patent with antegrade flow. 2.  Diffusely coarsened and echogenic hepatic parenchyma as could be seen with hepatic steatosis or acute hepatitis. 3. No evidence of biliary ductal dilation. Ester Sides, MD [facility] Radiology Electronically Signed   By: Ester Sides M.D.   On: 07/08/2024 06:30   CT ABDOMEN PELVIS W CONTRAST Result Date: 07/07/2024 CLINICAL DATA:  Acute abdominal pain. Altered mental status and jaundice. EXAM: CT ABDOMEN AND PELVIS WITH CONTRAST TECHNIQUE: Multidetector CT imaging of the abdomen and pelvis was performed using the standard protocol following bolus administration of intravenous contrast. RADIATION DOSE REDUCTION: This exam was performed according to the departmental dose-optimization program which includes automated exposure control, adjustment of the mA and/or kV according to patient size and/or use of iterative reconstruction technique. CONTRAST:  75mL OMNIPAQUE IOHEXOL 300 MG/ML  SOLN COMPARISON:  None Available. FINDINGS: Lower chest: Bandlike areas of atelectasis within both lower lobes. Trace pleural thickening without significant effusion. Hepatobiliary: Enlarged liver spanning 18.9 cm cranial caudal. Advanced hepatic steatosis. Allowing for motion artifact, no focal liver abnormality. There may be subtle capsular nodularity, although motion obscures assessment. Small gallstone within minimally distended gallbladder. No biliary dilatation. Pancreas: Parenchymal atrophy. No ductal dilatation or inflammation. No evidence of pancreatic mass. Spleen: No splenomegaly.  No focal splenic abnormality. Adrenals/Urinary Tract: No adrenal nodule. No hydronephrosis. Symmetric bilateral perinephric stranding. No renal calculi or suspicious renal abnormality. Absent excretion on delayed phase imaging. Partially distended urinary bladder, mildly thick walled. Stomach/Bowel: Small hiatal hernia. Decompressed stomach. Small bowel is decompressed. Normal appendix is  tentatively visualized, regardless no appendicitis. Mild wall thickening about the ascending and hepatic flexure of the colon. Moderate stool within the left colon. No bowel obstruction. Vascular/Lymphatic: Aortic atherosclerosis. No aneurysm. The portal vein is patent allowing for motion. No abdominopelvic adenopathy. Reproductive: Prostate is unremarkable. Other: Trace free fluid in the pelvis, but no significant ascites. No free air. No abdominal wall hernia. Musculoskeletal: Remote left rib fractures. Chronic hip arthropathy and postsurgical change. No acute osseous findings. IMPRESSION: 1. Hepatomegaly and advanced hepatic steatosis. There may be subtle capsular nodularity, although motion obscures assessment. Recommend correlation with any clinical or laboratory findings of cirrhosis. 2. Mild wall thickening about the ascending and hepatic flexure of the colon, suspicious for colitis. 3. Cholelithiasis without gallbladder inflammation. 4. Absent renal excretion on delayed phase imaging, often seen with renal dysfunction. Aortic Atherosclerosis (ICD10-I70.0). Electronically Signed   By: Andrea Gasman M.D.   On: 07/07/2024 14:19   DG Chest Port 1 View Result Date: 07/07/2024 CLINICAL DATA:  Possible sepsis. Altered mental status and jaundice. Hypothermia. EXAM: PORTABLE CHEST 1 VIEW COMPARISON:  Head FINDINGS: The heart size and mediastinal contours are within normal limits. No consolidation, effusion, or pneumothorax. Old rib fractures are present bilaterally. IMPRESSION: No active disease. Electronically Signed   By: Leita Birmingham M.D.   On: 07/07/2024 14:15   CT Head Wo Contrast Result Date: 07/07/2024 EXAM: CT HEAD WITHOUT CONTRAST 07/07/2024 12:59:28 PM TECHNIQUE: CT of the head was performed without the administration of intravenous contrast. Automated exposure control, iterative reconstruction, and/or weight based adjustment of the mA/kV was utilized to reduce the radiation dose to as low as  reasonably achievable. COMPARISON: 01/06/2016 CLINICAL HISTORY: Mental status change, unknown cause. AMS. FINDINGS: BRAIN AND VENTRICLES: No acute hemorrhage. No evidence of acute infarct. No hydrocephalus. No extra-axial collection. No mass effect or midline shift. Prominence of the sulci and ventricles compatible with brain atrophy. Hypoattenuating foci in the cerebral white matter, most likely representing chronic small vessel disease. ORBITS: No acute abnormality. SINUSES: No acute abnormality. SOFT TISSUES AND SKULL: No acute soft tissue abnormality. No skull fracture. IMPRESSION: 1. No acute intracranial abnormality. Electronically signed by: Birmingham Calk MD 07/07/2024 01:04 PM EDT RP Workstation: [id number]    ECHO as above  TELEMETRY (personally reviewed): sinus rhythm 1st degree AVB rate 90s  EKG (personally reviewed): no EKG on admission for review  Data reviewed by me 07/11/2024: last 24h vitals tele labs imaging I/O ED provider note, admission H&P, PCCM notes  Principal Problem:   Acute metabolic encephalopathy    ASSESSMENT AND PLAN:  Sergio Curry is a 63 y.o. male  with a past medical history of alcohol use, liver disease who presented to the ED on 07/07/2024 for altered mental status and jaundice. Echo this admission with concern for SBE of anterior leaflet of aortic valve. Cardiology was consulted for further evaluation.   # Possible endocarditis # Acute toxic metabolic encephalopathy # Alcoholic hepatitis # Severe thrombocytopenia Patient initially presented for altered mental status, jaundice.  Treated for alcoholic hepatitis with sepsis, possible colitis.  Severely thrombocytopenic since admission.  Echo done 07/08/2024 with possible vegetation of aortic valve. cMRI with poor image quality, unable to rule out endocarditis. Did note aortic  valve prolapse with moderate AR. - With improvement in platelets after transfusion yesterday and given that patient now intubated,  will proceed with TEE in ICU today around 12 PM.  - Further management of alcoholic hepatitis, metabolic encephalopathy, anemia and thrombocytopenia as per primary team. -Mildly elevated and flat trending troponins most consistent with demand/supply mismatch and not ACS in the setting of above illness.  This patient's plan of care was discussed and created with Dr. Custovic and he is in agreement.  Signed: Danita Bloch, PA-C  07/11/2024, 8:31 AM [facility] Cardiology

## 2024-07-11 NOTE — Plan of Care (Signed)
 ?  Problem: Coping: ?Goal: Level of anxiety will decrease ?Outcome: Progressing ?  ?Problem: Safety: ?Goal: Ability to remain free from injury will improve ?Outcome: Progressing ?  ?

## 2024-07-11 NOTE — Consult Note (Signed)
 Consultation Note Date: 07/11/2024 at 0930  Patient Name: Sergio Curry  DOB: 10/04/1960  MRN: [id number]  Age / Sex: 63 y.o., male  PCP: Patient, No Pcp Per Referring Physician: Malka Domino, MD  HPI/Patient Profile: 63 y.o. male  with past medical history of EtOH abuse with alcoholic withdrawal seizures, liver disease, and bilateral hip replacement admitted on 07/07/2024 with AMS and jaundice.  Per chart review, patient presented to ED hypothermic and confirmed he has liver disease while also drinking a few beers a day.  Patient is being treated for mixed distributive and septic shock likely due to sepsis with multiorgan failure, toxic metabolic encephalopathy secondary to alcohol use disorder with acute alcoholic hepatitis and liver failure  Cultures revealed staph epidermidis bacteremia that was possibly a contaminant.  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  Cardiac MRI and repeat blood cultures pending.  Course of hospitalization and significant hospital events are as follows: 10/25: Admitted with acute toxic metabolic encephalopathy, sepsis, possible colitis, alcoholic hepatitis, severe lactic acidosis, macrocytic anemia, and pancytopenia.  Pt received 2 units of pRBC's and 2 units of platelets  10/26: Pt showed signs of ETOH withdrawal overnight requiring ativan  per CIWA protocol once.  CBC stable at this time.  GI consulted and will see pt today  10/27: Pt is on 5L [place]. Remains in withdrawal and not oriented. Platelets 12 this am. Requiring prn Ativan . 10/28: Critically ill appearing with increasing oxygen requirements, on HHFNC this am and transitioned to Bipap.  Required mechanical intubation due to decline in respiratory status.  Worsening kidney function, as well as elevated BNP requiring lasix 80mg  x 1. Hypotension requiring levophed gtt.   10/29: Pt remains mechanically intubated FiO2 50%.  Remains on dobutamine gtt @2 .5 mcg/kg/min; levophed gtt @20  mcg/min, and vasopressin gtt @0 .03 units.  Now with worsening renal function and hyperbilirubinemia. Pending TEE today   PMT was consulted to support patient and family with goals of care discussions.  Clinical Assessment and Goals of Care: Extensive chart review completed prior to meeting patient including labs, vital signs, imaging, progress notes, orders, and available advanced directive documents from current and previous encounters. I then met with patient and his significant other [name] at bedside to discuss diagnosis prognosis, GOC, EOL wishes, disposition and options.  I introduced Palliative Medicine as specialized medical care for people living with serious illness. It focuses on providing relief from the symptoms and stress of a serious illness. The goal is to improve quality of life for both the patient and the family.  We discussed a brief life review of the patient.  Mr. Andreoni and [name] were never married, however they have been together for 22 years.  Mr. Majerus never married, has no children, has no siblings, and both of his parents are deceased.  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  She shares he has hit the bottle much harder lately.  She shares that he enjoyed skateboarding and  his 69s and also notes having to ride a unicycle.  She shares he is quite chatty and talkative as well as stubborn.  We discussed patient's current illness and what it means in the larger context of patient's on-going co-morbidities.  Education provided on intubation with mechanical ventilatory support, use of pressors, IV antibiotics, liver disease, and multiorgan failure.    Natural disease trajectory and expectations at EOL were discussed.  Discussed continuing with current plan of care.  Additionally discussed shifting to  comfort focused care, one-way compassionate extubation, and aggressive symptom management as patient faces end-of-life.  Additionally, she shares concern that she is not his legal power of attorney. I outlined to [name] that [facility] law dictates a specific order of decision makers for patients like Mr. Raby who are incapacitated and unable to speak for themselves.   I described that that the following persons, in the order indicated, are authorized to consent to medical treatment on behalf of a patient who is comatose or otherwise lacks capacity to make or communicate health care decisions:  1) Guardian or HCPOA 2) An appointed health care agent pursuant of a valid HCPOA 3) An agent appointed by the patient 4) Patient's spouse 5) Majority of patient's reasonably available parents and children who are at least 65 years of age 51) Majority of patient's reasonably available siblings who are at least 36 years of age 49) An individual who has an established relationship with the patient, who is acting in good faith on behalf of the patient, and who can reliably convey the patient's wishes.   Given history collected from [place] as well as RN Debi, patient has no guardian, appointed advertising account executive, agent appointed by the patient, spouse, children, parents, or siblings. Therefore, [name] falls within category #7.    [name] shares understanding and endorse that she is willing to make decisions on patient's behalf.  I attempted to elicit values and goals of care important to the patient.  Space and opportunity provided for [name] to share thoughts and emotions regarding patient's current medical situation.  She shares she does not believe he would want to live like this.  She says she was on the fence before intubation.  She does not want him to suffer or be in pain.  She shares she is willing more towards doing what ever is needed to keep him comfortable.  However, she would like more time to consider her  options.  She is grateful for our discussion and information provided.  Code status discussed in detail.  Difference between full code, DNR with full interventions, DNR with limited interventions, and DNR with comfort measures discussed.  [name] shares she believes patient would not want to be a DNR.  She would like to continue to treat the treatable for today but would not want patient to receive ACLS, CPR, heroic measures should his heart and lungs cease to work.  She is in agreement with DNR and full interventions.  After meeting with [name], I counseled with attending, NP Maranda, and TOC.  All are in agreement that [name] is patient's reasonably available surrogate decision maker.  They were also made aware that [name] has changed patient's CODE STATUS to DNR with full interventions.  Plan remains for continuing current plan of care.  [name] would like to reconvene tomorrow to further discuss next steps.  I plan to follow-up with patient in [place] tomorrow morning.  PMT contact info given.  [name] encouraged to call with any palliative needs before  our meeting again tomorrow.  Primary Decision Maker OTHER  Physical Exam Vitals reviewed.  Constitutional:      Appearance: He is ill-appearing.  HENT:     Head: Normocephalic.     Mouth/Throat:     Mouth: Mucous membranes are moist.  Cardiovascular:     Rate and Rhythm: Normal rate.     Pulses: Normal pulses.  Pulmonary:     Comments: Mechanical ventilatory support Abdominal:     Palpations: Abdomen is soft.  Skin:    General: Skin is dry.     Coloration: Skin is jaundiced.     Palliative Assessment/Data: 20-30%     Thank you for this consult. Palliative medicine will continue to follow and assist holistically.   75 minute visit includes: Detailed review of medical records (labs, imaging, vital signs), medically appropriate exam (mental status, respiratory, cardiac, skin), discussed with treatment team, counseling and educating  patient, family and staff, documenting clinical information, medication management and coordination of care.  Signed by: Lamarr Gunner, DNP, FNP-BC Palliative Medicine   Please contact Palliative Medicine Team providers via [facility] for questions and concerns.

## 2024-07-11 NOTE — Progress Notes (Signed)
 Date of Admission:  07/07/2024      ID: Sergio Curry is a 63 y.o. male  Principal Problem:   Acute metabolic encephalopathy    Subjective: Remains intubated  Medications:   Chlorhexidine Gluconate Cloth  6 each Topical Daily   docusate  100 mg Per Tube BID   folic acid  1 mg Intravenous Daily   hydrocortisone sod succinate (SOLU-CORTEF) inj  50 mg Intravenous Q8H   lactulose  20 g Per NG tube BID   pantoprazole (PROTONIX) IV  40 mg Intravenous Q12H   PHENObarbital  65 mg Intravenous Q8H   Followed by   NOREEN ON 07/13/2024] PHENObarbital  32.5 mg Intravenous Q8H   polyethylene glycol  17 g Per Tube Daily   sodium chloride  flush  10-40 mL Intracatheter Q12H   thiamine (VITAMIN B1) injection  100 mg Intravenous Daily   vancomycin variable dose per unstable renal function (pharmacist dosing)   Does not apply See admin instructions    Objective: Vital signs in last 24 hours: BP (!) 140/43   Pulse 93   Temp 99.6 F (37.6 C) (Oral)   Resp (!) 25   Ht 5' 9 (1.753 m)   Wt 65 kg   SpO2 99%   BMI 21.16 kg/m       PHYSICAL EXAM: Intubated  Sedated  Head: Normocephalic, without obvious abnormality, atraumatic. Eyes: Conjunctivae clear, icteric sclerae. Pupils are equal Neck: Supple, symmetrical, no adenopathy, thyroid: non tender no carotid bruit and no JVD.  Lungs:b/l air entry Heart: Tachycardia Abdomen: Soft, non-tender,not distended. Bowel sounds normal. No masses Extremities: atraumatic, no cyanosis. No edema. No clubbing Skin: bruising, petechiae Lymph: Cervical, supraclavicular normal. Neurologic: cannot assess  Lab Results    Latest Ref Rng & Units 07/11/2024    5:11 AM 07/11/2024   12:05 AM 07/10/2024    3:13 AM  CBC  WBC 4.0 - 10.5 K/uL 4.0 - 10.5 K/uL 25.8    26.0  25.2  20.6   Hemoglobin 13.0 - 17.0 g/dL 86.9 - 82.9 g/dL 7.2    7.2  7.4  8.2   Hematocrit 39.0 - 52.0 % 39.0 - 52.0 % 21.6    21.7  22.8  24.1   Platelets 150 - 400  K/uL 150 - 400 K/uL 20    20  21  12         Latest Ref Rng & Units 07/11/2024    5:11 AM 07/11/2024   12:05 AM 07/10/2024    3:13 AM  CMP  Glucose 70 - 99 mg/dL 842  847  893   BUN 8 - 23 mg/dL 62  60  56   Creatinine 0.61 - 1.24 mg/dL 7.91  8.03  8.19   Sodium 135 - 145 mmol/L 138  140  138   Potassium 3.5 - 5.1 mmol/L 3.7  4.0  3.5   Chloride 98 - 111 mmol/L 99  101  101   CO2 22 - 32 mmol/L 21  20  21    Calcium 8.9 - 10.3 mg/dL 7.9  8.0  7.7   Total Protein 6.5 - 8.1 g/dL 6.4   6.0   Total Bilirubin 0.0 - 1.2 mg/dL 87.8   89.8   Alkaline Phos 38 - 126 U/L 114   119   AST 15 - 41 U/L 95   94   ALT 0 - 44 U/L 23   25       Microbiology: 07/07/24 MSSE 07/09/24 BC- NG Studies/Results: DG  Abd 1 View Result Date: 07/10/2024 CLINICAL DATA:  NG placement. EXAM: ABDOMEN - 1 VIEW COMPARISON:  CT abdomen pelvis dated 07/07/2024. FINDINGS: Enteric tube with tip and side-port in the left upper abdomen the proximal stomach. No bowel dilatation. No acute osseous pathology. IMPRESSION: Enteric tube with tip and side-port in the proximal stomach. Electronically Signed   By: Vanetta Chou M.D.   On: 07/10/2024 14:36   DG Chest Port 1 View Result Date: 07/10/2024 CLINICAL DATA:  Central line placement. EXAM: PORTABLE CHEST 1 VIEW COMPARISON:  Chest radiograph dated 07/10/2024. FINDINGS: Left IJ central venous line with tip over central SVC. Endotracheal tube approximately 5 cm above the carina and enteric tube extends below diaphragm with tip beyond the inferior margin of the image. Bilateral pulmonary opacities as seen on the earlier radiograph. No pneumothorax. Stable cardiac silhouette. No acute osseous pathology. IMPRESSION: Left IJ central venous line with tip over central SVC. No pneumothorax. Electronically Signed   By: Vanetta Chou M.D.   On: 07/10/2024 14:34   DG Chest Port 1 View Result Date: 07/10/2024 EXAM: 1 VIEW XRAY OF THE CHEST 07/10/2024 11:13:29 AM COMPARISON: Same  day. CLINICAL HISTORY: [id number] Encounter for intubation [id number]. Encounter for intubation Encounter for intubation FINDINGS: LINES, TUBES AND DEVICES: Endotracheal tube tip is seen 1 cm above the carina and directed slightly towards the right mainstem bronchus. Withdrawal by 2 to 3 cm is recommended. LUNGS AND PLEURA: Opacities are again noted consistent with pneumonia or possibly edema. No pleural effusion. No pneumothorax. HEART AND MEDIASTINUM: No acute abnormality of the cardiac and mediastinal silhouettes. BONES AND SOFT TISSUES: No acute osseous abnormality. IMPRESSION: 1. Endotracheal tube tip 1 cm above the carina with slight right mainstem orientation; recommend withdrawal by 23 cm. 2. Opacities consistent with pneumonia or edema. Electronically signed by: Lynwood Seip MD 07/10/2024 11:30 AM EDT RP Workstation: [id number]   DG Chest Port 1 View Result Date: 07/10/2024 EXAM: 1 VIEW(S) XRAY OF THE CHEST 07/10/2024 03:26:54 AM COMPARISON: Chest x-ray dated 11/10/2023. CLINICAL HISTORY: [id number] Hypoxia FINDINGS: LUNGS AND PLEURA: Interval increase in bilateral perihilar, right greater than left, streaky consolidative airspace opacity. Left upper lobe patchy airspace opacities. Blunting of the right costophrenic angle with likely underlying trace pleural effusion. Left costophrenic angle is collimated off view. No pulmonary edema. No pneumothorax. HEART AND MEDIASTINUM: No acute abnormality of the cardiac and mediastinal silhouettes. BONES AND SOFT TISSUES: No acute osseous abnormality. IMPRESSION: 1. Interval increase in bilateral perihilar, right greater than left, consolidative and patchy airspace opacities, most consistent with multifocal pneumonia or aspiration pneumonitis. 2. Blunting of the right costophrenic angle with likely trace pleural effusion. Electronically signed by: Morgane Naveau MD 07/10/2024 03:47 AM EDT RP Workstation: [id number]   DG Chest Port 1 View Result Date: 07/09/2024 EXAM: 1  VIEW(S) XRAY OF THE CHEST 07/09/2024 04:45:00 PM COMPARISON: Chest x-ray 07/07/2024. CLINICAL HISTORY: [id number] Dyspnea [id number]. Dyspnea FINDINGS: LUNGS AND PLEURA: There is new bilateral multifocal airspace consolidation, right greater than left. This is predominantly central in location. No pulmonary edema. No pleural effusion. No pneumothorax. HEART AND MEDIASTINUM: No acute abnormality of the cardiac and mediastinal silhouettes. BONES AND SOFT TISSUES: There are multiple healed left-sided rib fractures. IMPRESSION: 1. New bilateral multifocal airspace consolidation, right greater than left, predominantly central in location. Electronically signed by: Greig Pique MD 07/09/2024 09:42 PM EDT RP Workstation: [id number]   MR CARDIAC MORPHOLOGY W WO CONTRAST Result Date: 07/09/2024 CLINICAL DATA:  Aortic regurgitation, possible aortic vegetation EXAM: CARDIAC MRI  TECHNIQUE: The patient was scanned on a 1.5 Tesla Siemens magnet. A dedicated cardiac coil was used. Functional imaging was done using Fiesta sequences. 2,3, and 4 chamber views were done to assess for RWMA's. Modified Simpson's rule using a short axis stack was used to calculate an ejection fraction on a dedicated work [employer]. The patient received 8 cc of Gadavist. After 10 minutes inversion recovery sequences were used to assess for infiltration and scar tissue. Velocity flow mapping performed in the ascending aorta and main pulmonary artery. CONTRAST:  8 cc  of Gadavist FINDINGS: 1. Normal left ventricular size and thickness. Mildly reduced LV systolic function (LVEF = 45%). There is global hypokinesis. There is no late gadolinium enhancement in the left ventricular myocardium. LVEDV: 172 ml LVESV: 94 ml SV: 77 ml CO: 4 L/min Myocardial mass: 90 g 2. Normal right ventricular size, thickness and low normal systolic function. There are no regional wall motion abnormalities. 3.  Normal left and right atrial size. 4. Normal size of the  aortic root, ascending aorta and pulmonary artery. 5. Aortic valve prolapse, moderate aortic valve regurgitation. Regurgitant volume 51 ml, regurgitant fraction 53%. 6.  Normal pericardium.  No pericardial effusion. IMPRESSION: 1.  Mildly reduced LV systolic function.  LVEF 45% 2.  No LGE or scar. 3.  Aortic valve prolapse and moderate aortic regurgitation. 4.  Poor image quality, cannot rule out aortic valve vegetation. 5.  Normal RV systolic function. Electronically Signed   By: Redell Cave M.D.   On: 07/09/2024 17:11   MR CARDIAC VELOCITY FLOW MAP Result Date: 07/09/2024 CLINICAL DATA:  Aortic regurgitation, possible aortic vegetation EXAM: CARDIAC MRI TECHNIQUE: The patient was scanned on a 1.5 Tesla Siemens magnet. A dedicated cardiac coil was used. Functional imaging was done using Fiesta sequences. 2,3, and 4 chamber views were done to assess for RWMA's. Modified Simpson's rule using a short axis stack was used to calculate an ejection fraction on a dedicated work [employer]. The patient received 8 cc of Gadavist. After 10 minutes inversion recovery sequences were used to assess for infiltration and scar tissue. Velocity flow mapping performed in the ascending aorta and main pulmonary artery. CONTRAST:  8 cc  of Gadavist FINDINGS: 1. Normal left ventricular size and thickness. Mildly reduced LV systolic function (LVEF = 45%). There is global hypokinesis. There is no late gadolinium enhancement in the left ventricular myocardium. LVEDV: 172 ml LVESV: 94 ml SV: 77 ml CO: 4 L/min Myocardial mass: 90 g 2. Normal right ventricular size, thickness and low normal systolic function. There are no regional wall motion abnormalities. 3.  Normal left and right atrial size. 4. Normal size of the aortic root, ascending aorta and pulmonary artery. 5. Aortic valve prolapse, moderate aortic valve regurgitation. Regurgitant volume 51 ml, regurgitant fraction 53%. 6.  Normal pericardium.  No  pericardial effusion. IMPRESSION: 1.  Mildly reduced LV systolic function.  LVEF 45% 2.  No LGE or scar. 3.  Aortic valve prolapse and moderate aortic regurgitation. 4.  Poor image quality, cannot rule out aortic valve vegetation. 5.  Normal RV systolic function. Electronically Signed   By: Redell Cave M.D.   On: 07/09/2024 17:11   MR CARDIAC VELOCITY FLOW MAP Result Date: 07/09/2024 CLINICAL DATA:  Aortic regurgitation, possible aortic vegetation EXAM: CARDIAC MRI TECHNIQUE: The patient was scanned on a 1.5 Tesla Siemens magnet. A dedicated cardiac coil was used. Functional imaging was done using Fiesta sequences. 2,3, and 4 chamber  views were done to assess for RWMA's. Modified Simpson's rule using a short axis stack was used to calculate an ejection fraction on a dedicated work [employer]. The patient received 8 cc of Gadavist. After 10 minutes inversion recovery sequences were used to assess for infiltration and scar tissue. Velocity flow mapping performed in the ascending aorta and main pulmonary artery. CONTRAST:  8 cc  of Gadavist FINDINGS: 1. Normal left ventricular size and thickness. Mildly reduced LV systolic function (LVEF = 45%). There is global hypokinesis. There is no late gadolinium enhancement in the left ventricular myocardium. LVEDV: 172 ml LVESV: 94 ml SV: 77 ml CO: 4 L/min Myocardial mass: 90 g 2. Normal right ventricular size, thickness and low normal systolic function. There are no regional wall motion abnormalities. 3.  Normal left and right atrial size. 4. Normal size of the aortic root, ascending aorta and pulmonary artery. 5. Aortic valve prolapse, moderate aortic valve regurgitation. Regurgitant volume 51 ml, regurgitant fraction 53%. 6.  Normal pericardium.  No pericardial effusion. IMPRESSION: 1.  Mildly reduced LV systolic function.  LVEF 45% 2.  No LGE or scar. 3.  Aortic valve prolapse and moderate aortic regurgitation. 4.  Poor image quality, cannot  rule out aortic valve vegetation. 5.  Normal RV systolic function. Electronically Signed   By: Redell Cave M.D.   On: 07/09/2024 17:11     Assessment/Plan: 63 year old male with history of alcohol abuse presenting with altered mental status   Metabolic encephalopathy Patient is on lactulose    Acute hypoxic respiratory failure secondary to pulmonary edema Patient has been intubated   Bicytopenia with severe anemia and thrombocytopenia  leucocytosis- uptick  MSSE bacteremia 4 out of 4 blood cultures have methicillin-susceptible Staph epidermidis.  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

## 2024-07-11 NOTE — Consult Note (Signed)
 WOC Nurse Consult Note: Reason for Consult:Asked to evaluate left arm at humerus and radius junction.  Skin breakdown in this area consistent with abrasion/trauma.  Wife at bedside and states he had a fall prior to admission and right arm/elbow caught his fall.  Right forearm and elbow with ecchymosis and edema. Arms are resting on pillows to elevate and protect Skin is jaundiced and there is scattered petechiae to bilateral arms and legs, but right arm consistent with trauma. Wife in agreement.  Wound type:trauma Pressure Injury POA: NA Measurement: Abrasion to right elbow, 0.3 cm nonintact abrasion Wound bed:red and moist Drainage (amount, consistency, odor) scant weeping Periwound:Bruising, edema  Dressing procedure/placement/frequency: Silicone foam to right elbow abrasion.  Change every three days and PRN soilage.  Will not follow at this time.  Please re-consult if needed.  Darice Cooley MSN, RN, FNP-BC CWON Wound, Ostomy, Continence Nurse [facility] [phone] Work cell phone:  [phone]

## 2024-07-11 NOTE — Progress Notes (Addendum)
 NAME:  Sergio Curry, MRN:  [id number], DOB:  [birth year], LOS: 4 ADMISSION DATE:  07/07/2024, CONSULTATION DATE: 07/07/2024 REFERRING MD: Dr. Jacolyn, CHIEF COMPLAINT: Altered Mental Status  History of Present Illness:  This is a 63 yo male who presented to [facility] ER on 10/25 via EMS with altered mental status and jaundice.  Per ED notes EMS reported when they arrived on the scene pt hypothermic temp 93.9 F, therefore they administered 500 ml NS bolus.    ED Course  Upon arrival to the ER pt reported he does have liver disease and drinks a few beers a day. Initial vital signs were: temp 94.1 F/sbp 97/hr 96/rr 20/O2 sats 94% on RA.  Significant lab results were: Na+132/K+ 2.9/chloride 96/CO2 18/BUN 37/creatinine 1.68/calcium 8.4/anion 8.4/alk phos 163/albumin 1.8/AST 128/ammonia 46/total bilirubin 8.3/troponin 171/lactic >9.0/wbc 15.3/hgb 6.6/platelet count 6/PT 20.7/INR 1.7/alcohol level <15.  Pt denies bloody stools, melena, or vomiting blood.  He denies taking blood thinners.  CT Head negative.  While in the ER pt received 2.5L LR bolus and flagyl/vancomycin ordered.  CT Abd/Pelvis revealed hepatomegaly and advanced hepatic steatosis, possible colitis, and cholelithiasis without gall bladder inflammation.  PCCM team contacted for ICU admission.    Pertinent  Medical History  Alcoholic Withdrawal Seizures   Micro Data:   10/25 MRSA PCR >>negative  10/25 Resp panel by RT-PCR >>negative  10/25 Respiratory Viral Panel >>negative  10/25 Blood Cultures x 2 >>+ Staph Epi  10/27 Repeat Blood Cultures >>NGTD  10/28: Resp>>few wbc present, predominantly pmn, no organism seen   Anti-infectives (From admission, onward)    Start     Dose/Rate Route Frequency Ordered Stop   07/11/24 1000  vancomycin (VANCOCIN) IVPB 1000 mg/200 mL premix        1,000 mg 200 mL/hr over 60 Minutes Intravenous Every 24 hours 07/10/24 1331     07/10/24 2330  cefTRIAXone (ROCEPHIN) 2 g in sodium chloride  0.9 % 100 mL  IVPB        2 g 200 mL/hr over 30 Minutes Intravenous Every 24 hours 07/10/24 1655     07/10/24 2300  metroNIDAZOLE (FLAGYL) IVPB 500 mg        500 mg 100 mL/hr over 60 Minutes Intravenous Every 12 hours 07/10/24 1655     07/10/24 1030  vancomycin (VANCOREADY) IVPB 1500 mg/300 mL        1,500 mg 150 mL/hr over 120 Minutes Intravenous  Once 07/10/24 0920 07/10/24 1536   07/10/24 0600  doxycycline (VIBRAMYCIN) 100 mg in sodium chloride  0.9 % 250 mL IVPB  Status:  Discontinued        100 mg 125 mL/hr over 120 Minutes Intravenous Every 12 hours 07/10/24 0428 07/10/24 1654   07/07/24 2200  piperacillin-tazobactam (ZOSYN) IVPB 3.375 g  Status:  Discontinued        3.375 g 12.5 mL/hr over 240 Minutes Intravenous Every 8 hours 07/07/24 1505 07/10/24 1655   07/07/24 2100  cefTRIAXone (ROCEPHIN) 2 g in sodium chloride  0.9 % 100 mL IVPB  Status:  Discontinued        2 g 200 mL/hr over 30 Minutes Intravenous Every 24 hours 07/07/24 1439 07/07/24 1453   07/07/24 1330  ceFEPIme (MAXIPIME) 2 g in sodium chloride  0.9 % 100 mL IVPB        2 g 200 mL/hr over 30 Minutes Intravenous  Once 07/07/24 1325 07/07/24 1438   07/07/24 1330  metroNIDAZOLE (FLAGYL) IVPB 500 mg        500 mg  100 mL/hr over 60 Minutes Intravenous  Once 07/07/24 1325 07/07/24 1810   07/07/24 1330  vancomycin (VANCOCIN) IVPB 1000 mg/200 mL premix        1,000 mg 200 mL/hr over 60 Minutes Intravenous  Once 07/07/24 1325 07/07/24 1910      Significant Hospital Events: Including procedures, antibiotic start and stop dates in addition to other pertinent events   10/25: Admitted with acute toxic metabolic encephalopathy, sepsis, possible colitis, alcoholic hepatitis, severe lactic acidosis, macrocytic anemia, and pancytopenia.  Pt received 2 units of pRBC's and 2 units of platelets  10/26: Pt showed signs of ETOH withdrawal overnight requiring ativan  per CIWA protocol once.  CBC stable at this time.  GI consulted and will see pt today   10/27: Pt is on 5L [place]. Remains in withdrawal and not oriented. Platelets 12 this am. Requiring prn Ativan . 10/28: Critically ill appearing with increasing oxygen requirements, on HHFNC this am and transitioned to Bipap.  Required mechanical intubation due to decline in respiratory status.  Worsening kidney function, as well as elevated BNP requiring lasix 80mg  x 1. Hypotension requiring levophed gtt.  10/29: Pt remains mechanically intubated FiO2 50%.  Remains on dobutamine gtt @2 .5 mcg/kg/min; levophed gtt @20  mcg/min, and vasopressin gtt @0 .03 units.  Now with worsening renal function and hyperbilirubinemia. Pending TEE today   Interim History / Subjective:  As outlined above under significant events    Objective    Blood pressure (!) 124/40, pulse 91, temperature 98.8 F (37.1 C), temperature source Axillary, resp. rate (!) 23, height 5' 9 (1.753 m), weight 65 kg, SpO2 95%.    Vent Mode: PRVC FiO2 (%):  [40 %-60 %] 50 % Set Rate:  [20 bmp] 20 bmp Vt Set:  [450 mL] 450 mL PEEP:  [5 cmH20-8 cmH20] 8 cmH20 Plateau Pressure:  [20 cmH20] 20 cmH20   Intake/Output Summary (Last 24 hours) at 07/11/2024 9271 Last data filed at 07/11/2024 0600 Gross per 24 hour  Intake 2624.69 ml  Output 887 ml  Net 1737.69 ml   Filed Weights   07/09/24 0500 07/10/24 0500 07/11/24 0500  Weight: 58.7 kg 65 kg 65 kg   Examination: General: Acute on chronically-ill appearing male, NAD mechanically intubated  HENT: Atraumatic, normocephalic, supple, no JVD, scleral icterus  Lungs: Faint rhonchi throughout, even, non labored  Cardiovascular: Sinus rhythm, s1s2, no r/g, no m/r/g Abdomen: +BS x4, obese, soft, non distended  Extremities: Normal bulk and tone, 2+ radial/1+ distal pulses, no edema  Neuro: Sedated, not following commands, withdraws from stimulation, PERRL  Skin: Jaundiced and generalized scattered petechiae  GU: Indwelling foley catheter draining dark yellow urine   Resolved problem list    Assessment and Plan   #Acute Toxic Metabolic Encephalopathy  #ETOH Abuse with Complicated Withdrawal #Hyperammonemia  #Mechanical intubation pain/discomfort  - Continue CIWA protocol - Completed 4 day coarse of high dose thiamine followed by 100mg  daily - Folic acid daily - Continue Phenobarbital taper - Correct metabolic derangements as indicated - Maintain sleep/wake cycle - Lactulose twice daily per tube  - Maintain RASS goal 0 to -1 - PAD protocol to maintain RASS gaol: propofol and prn fentanyl gtt  - WUA daily   #Septic Shock secondary to MSSE Bacteremia vs. 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 - Continuous telemetry monitoring  - Levophed and vasopressin gtts to maintain map >65 - Will start stress dose steroids  - Continue dobutamine gtt; follow coox  - Troponin peak: 171 - Cardiology consulted appreciate input: scheduled for TEE today  #Acute Hypoxic Respiratory Failure secondary to Multifocal vs. Aspiration Pneumonia #Pulmonary Edema #Trace Pleural Effusion #Mechanical ventilation  - Full vent support for now: vent settings reviewed and established  - Maintain plateau pressures less than 30 cm H2O - Continue lung protective strategies  - VAP bundle implemented  - SBT's once all parameters met  - Intermittent CXR and ABG's   #Acute Kidney Injury suspect secondary to ATN~worsening   #Anion Gap Metabolic Acidosis secondary to Bacteremia #Severe Lactic  Acidosis~resolved #Hypokalemia~resolved - Trend BMP  - Replace electrolytes as indicated  - Strict I&O's - Avoid nephrotoxic agents as able  - Palliative care consulted to discuss goals of treatment: if decision made to continue aggressive treatment will need to consult nephrology   #Alcoholic Hepatitis  #Elevated alk phos  #Hyperbilirubinemia~worsening  #Hypoalbuminemia CT Abd Pelvis W Contrast 10/25: Hepatomegaly and advanced hepatic steatosis. There may be subtle capsular nodularity, although motion obscures assessment. Recommend correlation with any clinical or laboratory findings of cirrhosis. Mild wall thickening about the ascending and hepatic flexure of the colon, suspicious for colitis. Cholelithiasis without gallbladder inflammation. Absent renal excretion on delayed phase imaging, often seen with renal dysfunction. Aortic Atherosclerosis US  ABD LTD RUG W Liver Doppler 10/25: Limited evaluation. The visualized portal vasculature is patent with antegrade flow. Diffusely coarsened and echogenic hepatic parenchyma as could be seen with hepatic steatosis or acute hepatitis. No evidence of biliary ductal dilation - Trend hepatic function panel - Tbili worsening: 12.1 - Acute hepatitis panel: non-reactive - Per GI, will need to have platelets >50K prior to endoscopic evaluation  #Septic Shock secondary to Staph Epi Bacteremia vs Multifocal or Aspiration Pneumonia #Suspected Endocarditis (pending TEE) #Colitis - Trend WBC and monitor fever curve  - Follow cultures  - ID consulted appreciate input: abx as outlined above per ID recommendations   #Macrocytic Anemia  #Anemia without signs of bleeding  #Severe Thrombocytopenia due to Alcoholic Hepatitis  #Elevated coags  - Trend CBC and monitor H&H, and coags - Transfuse if Hgb < 7 or platelets <10K or s/sx of bleeding - Pending transfusion 1 unit platelets  - Monitor for s/sx of bleeding - Blood smear: normal platelet morphology -  Fibrinogen 10/29: 237 - Heme and GI following; appreciate input - DVT prophylaxis: SCDs (avoid chemical VTE given anemia and coag results) - PPI: Protonix 40mg  Q12h  #Endo - ICU hypo/hyperglycemic protocol - CBG Q4h - Target CBG readings: 140-180  #Protein Malnutrition  - Diet: NPO - Dietician following; appreciate input  Pt with worsening multiorgan failure he is HIGH RISK FOR CARDIAC ARREST and SUDDEN DEATH.  Overall prognosis extremely poor recommend changing code status to DNR/DNI.  Palliative Care consulted to assist with goals of treatment.  Updated pts significant other [name] Silvan at bedside regarding pts condition and current plan of care  Labs   CBC: Recent Labs  Lab 07/07/24 1210 07/08/24 0426 07/09/24 0555 07/09/24 1052 07/10/24 0313 07/11/24 0005 07/11/24 0511  WBC 15.3* 17.9* 18.3* 17.8* 20.6* 25.2* 25.8*  NEUTROABS 11.1* 12.3*  --  13.4*  --   --   --  HGB 6.6* 7.9* 8.9* 8.3* 8.2* 7.4* 7.2*  HCT 19.8* 22.6* 26.0* 24.6* 24.1* 22.8* 21.6*  MCV 111.2* 95.8 98.1 98.8 98.4 101.3* 101.4*  PLT 6* 25* 12* 13* 12* 21* 20*    Basic Metabolic Panel: Recent Labs  Lab 07/07/24 1210 07/08/24 0426 07/08/24 2003 07/09/24 0555 07/10/24 0313 07/11/24 0005 07/11/24 0511  NA 132* 133*  --  134* 138 140 138  K 2.9* 2.9* 3.2* 3.1* 3.5 4.0 3.7  CL 96* 96*  --  100 101 101 99  CO2 18* 23  --  22 21* 20* 21*  GLUCOSE 79 128*  --  105* 106* 152* 157*  BUN 37* 40*  --  55* 56* 60* 62*  CREATININE 1.68* 1.42*  --  1.77* 1.80* 1.96* 2.08*  CALCIUM 8.4* 7.4*  --  7.9* 7.7* 8.0* 7.9*  MG 1.6* 2.3  --  2.6* 2.3  --  2.2  PHOS 1.4* 3.9  --  3.1 3.4 3.8 3.5   GFR: Estimated Creatinine Clearance: 33 mL/min (A) (by C-G formula based on SCr of 2.08 mg/dL (H)). Recent Labs  Lab 07/07/24 1957 07/08/24 0426 07/08/24 0829 07/08/24 1110 07/09/24 0555 07/09/24 1052 07/10/24 0313 07/10/24 0912 07/11/24 0005 07/11/24 0511  WBC  --    < >  --   --    < > 17.8* 20.6*  --  25.2*  25.8*  LATICACIDVEN 6.4*  --  1.8 1.3  --   --   --  1.4  --   --    < > = values in this interval not displayed.    Liver Function Tests: Recent Labs  Lab 07/07/24 1210 07/08/24 0426 07/09/24 0555 07/10/24 0313 07/11/24 0005 07/11/24 0511  AST 128* 100* 103* 94*  --  95*  ALT 30 25 27 25   --  23  ALKPHOS 163* 118 132* 119  --  114  BILITOT 8.3* 8.3* 11.0* 10.1*  --  12.1*  PROT 6.5 5.4* 6.3* 6.0*  --  6.4*  ALBUMIN 1.8* 1.7* 1.9* 1.6* 2.1* 2.2*   Recent Labs  Lab 07/07/24 1210  LIPASE 16   Recent Labs  Lab 07/07/24 1210 07/10/24 0313  AMMONIA 46* 46*    ABG    Component Value Date/Time   PHART 7.34 (L) 07/10/2024 1200   PCO2ART 38 07/10/2024 1200   PO2ART 127 (H) 07/10/2024 1200   HCO3 20.5 07/10/2024 1200   ACIDBASEDEF 4.8 (H) 07/10/2024 1200   O2SAT 65.3 07/11/2024 0005     Coagulation Profile: Recent Labs  Lab 07/07/24 1210 07/08/24 1110 07/09/24 1052 07/11/24 0511  INR 1.7* 1.5* 1.5* 1.6*    Cardiac Enzymes: No results for input(s): CKTOTAL, CKMB, CKMBINDEX, TROPONINI in the last 168 hours.  HbA1C: No results found for: HGBA1C  CBG: Recent Labs  Lab 07/10/24 1129 07/10/24 1656 07/10/24 1913 07/10/24 2312 07/11/24 0323  GLUCAP 99 120* 146* 129* 139*    Review of Systems: Positives in BOLD   Unable to assess pt mechanically   Past Medical History:  He,  has a past medical history of Seizures (HCC).   Surgical History:  No past surgical history on file.   Social History:   reports that he has quit smoking. His smoking use included cigarettes. He has never used smokeless tobacco. He reports current alcohol use of about 7.0 standard drinks of alcohol per week. He reports current drug use. Drug: Marijuana.   Family History:  His family history is not on file.  Allergies No Known Allergies   Home Medications  Prior to Admission medications   Medication Sig Start Date End Date Taking? Authorizing Provider   chlordiazePOXIDE  (LIBRIUM ) 25 MG capsule Take 1 capsule 5 times a day on day 1, then decrease by one capsule daily until gone 01/06/16   Trudy Dorn BRAVO, MD  ciprofloxacin -dexamethasone  (CIPRODEX ) OTIC suspension Place 4 drops into the right ear 2 (two) times daily. 05/22/20   LampteyAleene KIDD, MD  cloNIDine  (CATAPRES  - DOSED IN MG/24 HR) 0.1 mg/24hr patch Place 1 patch (0.1 mg total) onto the skin every 7 (seven) days. 01/06/16   Trudy Dorn BRAVO, MD     Critical care time: 60 minutes       Lonell Moose, AGNP  Pulmonary/Critical Care Pager [phone] (please enter 7 digits) PCCM Consult Pager [phone] (please enter 7 digits)

## 2024-07-11 NOTE — Plan of Care (Signed)

## 2024-07-11 NOTE — Consult Note (Signed)
 PHARMACY CONSULT NOTE - ELECTROLYTES  Pharmacy Consult for Electrolyte Monitoring and Replacement   Recent Labs: Height: 5' 9 (175.3 cm) Weight: 65 kg (143 lb 4.8 oz) IBW/kg (Calculated) : 70.7 Estimated Creatinine Clearance: 33 mL/min (A) (by C-G formula based on SCr of 2.08 mg/dL (H)). Potassium (mmol/L)  Date Value  07/11/2024 3.7   Magnesium (mg/dL)  Date Value  89/70/7974 2.2   Calcium (mg/dL)  Date Value  89/70/7974 7.9 (L)   Albumin (g/dL)  Date Value  89/70/7974 2.2 (L)   Phosphorus (mg/dL)  Date Value  89/70/7974 3.5   Sodium (mmol/L)  Date Value  07/11/2024 138   Corrected Ca: 9.3 mg/dL  Assessment  Sergio Curry is a 63 y.o. male presenting with altered mental status and jaundice. PMH significant for seizures and liver disease. Pharmacy has been consulted to monitor and replace electrolytes.  Diet: NPO   Pertinent medications: n/a  Goal of Therapy: Electrolytes WNL  Plan:  No electrolyte replacement indicated at this time.  Check BMP, Mg, Phos with AM labs  Thank you for allowing pharmacy to be a part of this patient's care.  Bernardino George, PharmD Candidate [phone] [facility] [employer] 07/11/2024 7:46 AM

## 2024-07-12 ENCOUNTER — Inpatient Hospital Stay: Payer: MEDICAID

## 2024-07-12 DIAGNOSIS — K760 Fatty (change of) liver, not elsewhere classified: Secondary | ICD-10-CM

## 2024-07-12 DIAGNOSIS — D61818 Other pancytopenia: Secondary | ICD-10-CM

## 2024-07-12 DIAGNOSIS — E440 Moderate protein-calorie malnutrition: Secondary | ICD-10-CM | POA: Insufficient documentation

## 2024-07-12 DIAGNOSIS — Z930 Tracheostomy status: Secondary | ICD-10-CM

## 2024-07-12 DIAGNOSIS — J811 Chronic pulmonary edema: Secondary | ICD-10-CM

## 2024-07-12 LAB — CBC
HCT: 27.4 % — ABNORMAL LOW (ref 39.0–52.0)
HCT: 26.7 % — ABNORMAL LOW (ref 39.0–52.0)
Hemoglobin: 9.1 g/dL — ABNORMAL LOW (ref 13.0–17.0)
Hemoglobin: 8.9 g/dL — ABNORMAL LOW (ref 13.0–17.0)
MCH: 32.9 pg (ref 26.0–34.0)
MCH: 33.5 pg (ref 26.0–34.0)
MCHC: 33.2 g/dL (ref 30.0–36.0)
MCHC: 33.3 g/dL (ref 30.0–36.0)
MCV: 98.9 fL (ref 80.0–100.0)
MCV: 100.4 fL — ABNORMAL HIGH (ref 80.0–100.0)
Platelets: 41 K/uL — ABNORMAL LOW (ref 150–400)
Platelets: 36 K/uL — ABNORMAL LOW (ref 150–400)
RBC: 2.77 MIL/uL — ABNORMAL LOW (ref 4.22–5.81)
RBC: 2.66 MIL/uL — ABNORMAL LOW (ref 4.22–5.81)
RDW: 19.1 % — ABNORMAL HIGH (ref 11.5–15.5)
RDW: 20 % — ABNORMAL HIGH (ref 11.5–15.5)
WBC: 27.1 K/uL — ABNORMAL HIGH (ref 4.0–10.5)
WBC: 27 K/uL — ABNORMAL HIGH (ref 4.0–10.5)
nRBC: 0.3 % — ABNORMAL HIGH (ref 0.0–0.2)
nRBC: 0.3 % — ABNORMAL HIGH (ref 0.0–0.2)

## 2024-07-12 LAB — COMPREHENSIVE METABOLIC PANEL WITH GFR
ALT: 22 U/L (ref 0–44)
AST: 78 U/L — ABNORMAL HIGH (ref 15–41)
Albumin: 2.1 g/dL — ABNORMAL LOW (ref 3.5–5.0)
Alkaline Phosphatase: 119 U/L (ref 38–126)
Anion gap: 16 — ABNORMAL HIGH (ref 5–15)
BUN: 69 mg/dL — ABNORMAL HIGH (ref 8–23)
CO2: 19 mmol/L — ABNORMAL LOW (ref 22–32)
Calcium: 8 mg/dL — ABNORMAL LOW (ref 8.9–10.3)
Chloride: 104 mmol/L (ref 98–111)
Creatinine, Ser: 2.26 mg/dL — ABNORMAL HIGH (ref 0.61–1.24)
GFR, Estimated: 32 mL/min — ABNORMAL LOW (ref >60)
Glucose, Bld: 197 mg/dL — ABNORMAL HIGH (ref 70–99)
Potassium: 3.9 mmol/L (ref 3.5–5.1)
Sodium: 139 mmol/L (ref 135–145)
Total Bilirubin: 12.5 mg/dL — ABNORMAL HIGH (ref 0.0–1.2)
Total Protein: 6.8 g/dL (ref 6.5–8.1)

## 2024-07-12 LAB — CBC WITH DIFFERENTIAL/PLATELET
Abs Immature Granulocytes: 1.16 K/uL — ABNORMAL HIGH (ref 0.00–0.07)
Basophils Absolute: 0.1 K/uL (ref 0.0–0.1)
Basophils Relative: 0 %
Eosinophils Absolute: 0 K/uL (ref 0.0–0.5)
Eosinophils Relative: 0 %
HCT: 26.9 % — ABNORMAL LOW (ref 39.0–52.0)
Hemoglobin: 9.1 g/dL — ABNORMAL LOW (ref 13.0–17.0)
Immature Granulocytes: 4 %
Lymphocytes Relative: 9 %
Lymphs Abs: 2.6 K/uL (ref 0.7–4.0)
MCH: 33 pg (ref 26.0–34.0)
MCHC: 33.8 g/dL (ref 30.0–36.0)
MCV: 97.5 fL (ref 80.0–100.0)
Monocytes Absolute: 1.2 K/uL — ABNORMAL HIGH (ref 0.1–1.0)
Monocytes Relative: 5 %
Neutro Abs: 22.6 K/uL — ABNORMAL HIGH (ref 1.7–7.7)
Neutrophils Relative %: 82 %
Platelets: 44 K/uL — ABNORMAL LOW (ref 150–400)
RBC: 2.76 MIL/uL — ABNORMAL LOW (ref 4.22–5.81)
RDW: 18.7 % — ABNORMAL HIGH (ref 11.5–15.5)
Smear Review: NORMAL
WBC: 27.7 K/uL — ABNORMAL HIGH (ref 4.0–10.5)
nRBC: 0.3 % — ABNORMAL HIGH (ref 0.0–0.2)

## 2024-07-12 LAB — COOXEMETRY PANEL
Carboxyhemoglobin: 1.1 % (ref 0.5–1.5)
Carboxyhemoglobin: 1.4 % (ref 0.5–1.5)
Carboxyhemoglobin: 1.4 % (ref 0.5–1.5)
Methemoglobin: <0.7 % (ref 0.0–1.5)
Methemoglobin: <0.7 % (ref 0.0–1.5)
Methemoglobin: <0.7 % (ref 0.0–1.5)
O2 Saturation: 59.1 %
O2 Saturation: 60.7 %
O2 Saturation: 78.7 %
Total hemoglobin: 18.4 g/dL — ABNORMAL HIGH (ref 12.0–16.0)
Total hemoglobin: 9.1 g/dL — ABNORMAL LOW (ref 12.0–16.0)
Total hemoglobin: 9.7 g/dL — ABNORMAL LOW (ref 12.0–16.0)
Total oxygen content: 58.3 %
Total oxygen content: 59.8 %
Total oxygen content: 77.5 %

## 2024-07-12 LAB — TYPE AND SCREEN
ABO/RH(D): A POS
Antibody Screen: NEGATIVE
Unit division: 0

## 2024-07-12 LAB — BPAM RBC
Blood Product Expiration Date: 202511272359
ISSUE DATE / TIME: 202510292030
Unit Type and Rh: 6200

## 2024-07-12 LAB — MAGNESIUM: Magnesium: 2.3 mg/dL (ref 1.7–2.4)

## 2024-07-12 LAB — GLUCOSE, CAPILLARY
Glucose-Capillary: 172 mg/dL — ABNORMAL HIGH (ref 70–99)
Glucose-Capillary: 152 mg/dL — ABNORMAL HIGH (ref 70–99)
Glucose-Capillary: 168 mg/dL — ABNORMAL HIGH (ref 70–99)
Glucose-Capillary: 164 mg/dL — ABNORMAL HIGH (ref 70–99)
Glucose-Capillary: 157 mg/dL — ABNORMAL HIGH (ref 70–99)
Glucose-Capillary: 147 mg/dL — ABNORMAL HIGH (ref 70–99)

## 2024-07-12 LAB — BPAM PLATELET PHERESIS
Blood Product Expiration Date: 202511012359
ISSUE DATE / TIME: 202510290921
Unit Type and Rh: 6200

## 2024-07-12 LAB — CULTURE, RESPIRATORY W GRAM STAIN: Culture: NO GROWTH

## 2024-07-12 LAB — HEMOGLOBIN AND HEMATOCRIT, BLOOD
HCT: 27.2 % — ABNORMAL LOW (ref 39.0–52.0)
Hemoglobin: 9 g/dL — ABNORMAL LOW (ref 13.0–17.0)

## 2024-07-12 LAB — PREPARE PLATELET PHERESIS: Unit division: 0

## 2024-07-12 LAB — VANCOMYCIN, RANDOM: Vancomycin Rm: 19 ug/mL

## 2024-07-12 LAB — LACTIC ACID, PLASMA
Lactic Acid, Venous: 5.2 mmol/L (ref 0.5–1.9)
Lactic Acid, Venous: 4 mmol/L (ref 0.5–1.9)

## 2024-07-12 LAB — PHOSPHORUS: Phosphorus: 5.9 mg/dL — ABNORMAL HIGH (ref 2.5–4.6)

## 2024-07-12 MED ORDER — FUROSEMIDE 10 MG/ML IJ SOLN
80.0000 mg | Freq: Once | INTRAMUSCULAR | Status: AC
  Administered 2024-07-12: 80 mg via INTRAVENOUS
  Filled 2024-07-12: qty 8

## 2024-07-12 MED ORDER — INSULIN ASPART 100 UNIT/ML IJ SOLN
1.0000 [IU] | INTRAMUSCULAR | Status: DC
  Administered 2024-07-12 (×3): 2 [IU] via SUBCUTANEOUS
  Administered 2024-07-12 – 2024-07-14 (×8): 1 [IU] via SUBCUTANEOUS
  Filled 2024-07-12 (×10): qty 1

## 2024-07-12 MED ORDER — FREE WATER
30.0000 mL | Status: DC
  Administered 2024-07-12 – 2024-07-13 (×5): 30 mL

## 2024-07-12 MED ORDER — VITAMIN C 500 MG PO TABS
500.0000 mg | ORAL_TABLET | Freq: Two times a day (BID) | ORAL | Status: DC
  Administered 2024-07-12 – 2024-07-13 (×3): 500 mg
  Filled 2024-07-12 (×3): qty 1

## 2024-07-12 MED ORDER — RIFAXIMIN 550 MG PO TABS
550.0000 mg | ORAL_TABLET | Freq: Two times a day (BID) | ORAL | Status: DC
  Administered 2024-07-12 – 2024-07-13 (×4): 550 mg
  Filled 2024-07-12 (×4): qty 1

## 2024-07-12 MED ORDER — PIVOT 1.5 CAL PO LIQD
1000.0000 mL | ORAL | Status: DC
  Filled 2024-07-12: qty 1000

## 2024-07-12 MED ORDER — DOBUTAMINE-DEXTROSE 4-5 MG/ML-% IV SOLN
2.5000 ug/kg/min | INTRAVENOUS | Status: DC
  Filled 2024-07-12: qty 250

## 2024-07-12 NOTE — Progress Notes (Signed)
 NAME:  Sergio Curry, MRN:  [id number], DOB:  1961/04/15, LOS: 5 ADMISSION DATE:  07/07/2024, CONSULTATION DATE: 07/07/2024 REFERRING MD: Dr. Jacolyn, CHIEF COMPLAINT: Altered Mental Status  History of Present Illness:  This is a 63 yo male who presented to [facility] ER on 10/25 via EMS with altered mental status and jaundice.  Per ED notes EMS reported when they arrived on the scene pt hypothermic temp 93.9 F, therefore they administered 500 ml NS bolus.    ED Course  Upon arrival to the ER pt reported he does have liver disease and drinks a few beers a day. Initial vital signs were: temp 94.1 F/sbp 97/hr 96/rr 20/O2 sats 94% on RA.  Significant lab results were: Na+132/K+ 2.9/chloride 96/CO2 18/BUN 37/creatinine 1.68/calcium 8.4/anion 8.4/alk phos 163/albumin 1.8/AST 128/ammonia 46/total bilirubin 8.3/troponin 171/lactic >9.0/wbc 15.3/hgb 6.6/platelet count 6/PT 20.7/INR 1.7/alcohol level <15.  Pt denies bloody stools, melena, or vomiting blood.  He denies taking blood thinners.  CT Head negative.  While in the ER pt received 2.5L LR bolus and flagyl/vancomycin ordered.  CT Abd/Pelvis revealed hepatomegaly and advanced hepatic steatosis, possible colitis, and cholelithiasis without gall bladder inflammation.  PCCM team contacted for ICU admission.    Pertinent  Medical History  Alcoholic Withdrawal Seizures   Micro Data:   10/25 MRSA PCR >>negative  10/25 Resp panel by RT-PCR >>negative  10/25 Respiratory Viral Panel >>negative  10/25 Blood Cultures x 2 >>+ Staph Epi  10/27 Repeat Blood Cultures >>NGTD  10/28: Resp>>few wbc present, predominantly pmn, no organism seen   Anti-infectives (From admission, onward)    Start     Dose/Rate Route Frequency Ordered Stop   07/11/24 1000  vancomycin (VANCOCIN) IVPB 1000 mg/200 mL premix  Status:  Discontinued        1,000 mg 200 mL/hr over 60 Minutes Intravenous Every 24 hours 07/10/24 1331 07/11/24 0838   07/11/24 0858  vancomycin variable dose  per unstable renal function (pharmacist dosing)         Does not apply See admin instructions 07/11/24 0858     07/10/24 2330  cefTRIAXone (ROCEPHIN) 2 g in sodium chloride  0.9 % 100 mL IVPB        2 g 200 mL/hr over 30 Minutes Intravenous Every 24 hours 07/10/24 1655     07/10/24 2300  metroNIDAZOLE (FLAGYL) IVPB 500 mg        500 mg 100 mL/hr over 60 Minutes Intravenous Every 12 hours 07/10/24 1655     07/10/24 1030  vancomycin (VANCOREADY) IVPB 1500 mg/300 mL        1,500 mg 150 mL/hr over 120 Minutes Intravenous  Once 07/10/24 0920 07/10/24 1536   07/10/24 0600  doxycycline (VIBRAMYCIN) 100 mg in sodium chloride  0.9 % 250 mL IVPB  Status:  Discontinued        100 mg 125 mL/hr over 120 Minutes Intravenous Every 12 hours 07/10/24 0428 07/10/24 1654   07/07/24 2200  piperacillin-tazobactam (ZOSYN) IVPB 3.375 g  Status:  Discontinued        3.375 g 12.5 mL/hr over 240 Minutes Intravenous Every 8 hours 07/07/24 1505 07/10/24 1655   07/07/24 2100  cefTRIAXone (ROCEPHIN) 2 g in sodium chloride  0.9 % 100 mL IVPB  Status:  Discontinued        2 g 200 mL/hr over 30 Minutes Intravenous Every 24 hours 07/07/24 1439 07/07/24 1453   07/07/24 1330  ceFEPIme (MAXIPIME) 2 g in sodium chloride  0.9 % 100 mL IVPB  2 g 200 mL/hr over 30 Minutes Intravenous  Once 07/07/24 1325 07/07/24 1438   07/07/24 1330  metroNIDAZOLE (FLAGYL) IVPB 500 mg        500 mg 100 mL/hr over 60 Minutes Intravenous  Once 07/07/24 1325 07/07/24 1810   07/07/24 1330  vancomycin (VANCOCIN) IVPB 1000 mg/200 mL premix        1,000 mg 200 mL/hr over 60 Minutes Intravenous  Once 07/07/24 1325 07/07/24 1910      Significant Hospital Events: Including procedures, antibiotic start and stop dates in addition to other pertinent events   10/25: Admitted with acute toxic metabolic encephalopathy, sepsis, possible colitis, alcoholic hepatitis, severe lactic acidosis, macrocytic anemia, and pancytopenia.  Pt received 2 units of  pRBC's and 2 units of platelets  10/26: Pt showed signs of ETOH withdrawal overnight requiring ativan  per CIWA protocol once.  CBC stable at this time.  GI consulted and will see pt today  10/27: Pt is on 5L [place]. Remains in withdrawal and not oriented. Platelets 12 this am. Requiring prn Ativan . 10/28: Critically ill appearing with increasing oxygen requirements, on HHFNC this am and transitioned to Bipap.  Required mechanical intubation due to decline in respiratory status.  Worsening kidney function, as well as elevated BNP requiring lasix 80mg  x 1. Hypotension requiring levophed gtt.  10/29: Pt remains mechanically intubated FiO2 50%.  Remains on dobutamine gtt @2 .5 mcg/kg/min; levophed gtt @20  mcg/min, and vasopressin gtt @0 .03 units.  Now with worsening renal function and hyperbilirubinemia. Pending TEE today  10/30: Remains mechanically intubated with FiO2 requirements at 40%, peep 5. Remains on levo 12, vaso 0.03 and dobutamine 2.5. Will discontinue dobutamine today given recent Coox of 77. Renal function worsening (Cr 2.26) and Tbili 12.5.   Interim History / Subjective:  As outlined above under significant events    Objective    Blood pressure (!) 130/46, pulse 89, temperature (!) 100.5 F (38.1 C), resp. rate 20, height 5' 9 (1.753 m), weight 61.8 kg, SpO2 95%.    Vent Mode: PRVC FiO2 (%):  [40 %-50 %] 40 % Set Rate:  [20 bmp-220 bmp] 20 bmp Vt Set:  [450 mL] 450 mL PEEP:  [5 cmH20-8 cmH20] 5 cmH20 Plateau Pressure:  [16 cmH20-21 cmH20] 16 cmH20   Intake/Output Summary (Last 24 hours) at 07/12/2024 0802 Last data filed at 07/12/2024 0700 Gross per 24 hour  Intake 2300.39 ml  Output 775 ml  Net 1525.39 ml   Filed Weights   07/10/24 0500 07/11/24 0500 07/12/24 0419  Weight: 65 kg 65 kg 61.8 kg   Examination: General: Acute on chronically-ill appearing male, NAD mechanically intubated  HENT: Atraumatic, normocephalic, supple, no JVD, scleral icterus  Lungs: even, non  labored, faint rhonchi at bases Cardiovascular: NSR, S1 S2, no r/g, diastolic murmur Abdomen: soft, non-distended, non-tender, no rebound/guarding, bowel sounds x 4 Extremities: normal bulk/tone, radial pulse 2+, distal 1+, trace edema Neuro: intubated and sedated, not following commands, withdraw from painful stimuli Skin: warm, dry, scattered petechiae, jaundice GU: indwelling foley catheter draining clear dark urine  Resolved problem list   Assessment and Plan   #Acute Toxic Metabolic Encephalopathy  #ETOH Abuse with Complicated Withdrawal #Hyperammonemia  #Mechanical Intubation pain/discomfort  - PAD protocol: maintain RASS goal 0 to -1, propofol and prn fentanyl gtt - Daily wake up assessment - Continue CIWA protocol - Completed 4 day coarse of high dose thiamine followed by 100mg  daily - Folic acid daily - Continue Phenobarbital taper - Correct metabolic derangements as  indicated - Maintain sleep/wake cycle - Lactulose twice daily per tube    #Septic Shock secondary to MSSE Bacteremia vs. 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 - Continuous cardiac monitoring - Vasopressors as needed to maintain MAP goal >65 - Remains on Levophed and Vasopressin - Wean pressor requirements as able - Continue Hydrocortisone 100mg  - Trend Coox, most recent 77, discontinue dobutamine - Check CVP - Troponin peak: 171 - Cardiology following, appreciate input - TEE not performed due  to pressor requirements and multi-organ failure   #Acute Hypoxic Respiratory Failure secondary to Multifocal vs. Aspiration Pneumonia #Pulmonary Edema #Trace Pleural Effusion #Mechanical ventilation  - Continue full ventilator support for now: settings reviewed and established  - Plateau pressures to remain less than 30 cm H2O - SBT once parameters met - Lung protective strategies - VAP bundle - Pulmonary Hygiene - Intermittent CXR and ABG's as indicated   #Acute Kidney Injury suspect secondary to ATN ~ worsening   #Anion Gap Metabolic Acidosis secondary to MSSE Bacteremia #Severe Lactic Acidosis ~ resolved #Hypokalemia ~ resolved - Strict I/O's - Trend BMP and renal function - Creatinine worsening at 2.26 - Electrolyte replacement protocol as indicated; pharmacy to assist with replacement - Avoid nephrotoxic agents as able  - Ensure renal perfusion  - Palliative care following and will discuss goals of treatment with significant other - Nephro consult if decision to continue aggressive treatment is made   #Alcoholic Hepatitis  #Elevated alk phos  #Hyperbilirubinemia ~ worsening  #Hypoalbuminemia 10/25 CT Abd Pelvis W Contrast: Hepatomegaly and advanced hepatic steatosis. There may be subtle capsular nodularity, although motion obscures assessment. Recommend correlation with any clinical or laboratory findings of cirrhosis. Mild wall thickening about the ascending and hepatic flexure of the colon, suspicious for colitis. Cholelithiasis without gallbladder inflammation. Absent renal excretion on delayed phase imaging, often seen with renal dysfunction. Aortic Atherosclerosis 10/25 US  ABD LTD RUG W Liver Doppler: Limited evaluation. The visualized portal vasculature is patent with antegrade flow. Diffusely coarsened and echogenic hepatic parenchyma as could be seen with hepatic steatosis or acute hepatitis. No evidence of biliary ductal dilation - Trend hepatic function panel -  Tbili worsening at  12.5 this am - Acute hepatitis panel: non-reactive - GI previously evaluated patient; plts to be >50K for EGD evaluation    #Septic Shock secondary to Staph Epi Bacteremia vs Multifocal or Aspiration Pneumonia #Suspected Endocarditis (pending TEE) #Colitis - Trend WBC and monitor fever curve - Follow cultures as available - Continue abx and narrow per culture and sensitivities - ID following, abx per recommendations outlined    #Macrocytic Anemia  #Anemia without signs of bleeding  #Severe Thrombocytopenia due to Alcoholic Hepatitis  #Elevated coags  - Trend CBC and monitor H&H, and coags - Transfuse if Hgb < 7 or platelets <10K or s/sx of bleeding - 1 unit of platelets transfused with improved H&H - Monitor for s/sx of bleeding - Blood smear: normal platelet morphology - Most recent Fibrinogen 10/29: 237 - DVT prophylaxis: SCDs (avoid chemical VTE given anemia and coag results) - PPI: Continue Protonix 40mg  Q12h - GI  and Heme following; appreciate input  #Endo - ICU hypo/hyperglycemic protocol - CBG Q4h - Goal glucose: 140-180    #Protein Malnutrition  - Diet: NPO - Dietician following; appreciate input  Pt with worsening multiorgan failure he is HIGH RISK FOR CARDIAC ARREST and SUDDEN DEATH.  Overall prognosis extremely poor recommend changing code status to DNR/DNI.  Palliative Care consulted to assist with goals of treatment.  Updated pts significant other [name] Silvan at bedside regarding pts condition and current plan of care   Labs   CBC: Recent Labs  Lab 07/08/24 0426 07/09/24 0555 07/09/24 1052 07/10/24 0313 07/11/24 0005 07/11/24 0511 07/11/24 1619 07/12/24 0004 07/12/24 0402  WBC 17.9*   < > 17.8*   < > 25.2* 26.0*  25.8* 27.9* 27.7* 27.1*  NEUTROABS 12.3*  --  13.4*  --   --  18.7* 23.3* 22.6*  --   HGB 7.9*   < > 8.3*   < > 7.4* 7.2*  7.2* 6.8* 9.1* 9.1*  HCT 22.6*   < > 24.6*   < > 22.8* 21.7*  21.6* 20.9* 26.9* 27.4*  MCV  95.8   < > 98.8   < > 101.3* 101.4*  101.4* 102.5* 97.5 98.9  PLT 25*   < > 13*   < > 21* 20*  20* 53* 44* 41*   < > = values in this interval not displayed.    Basic Metabolic Panel: Recent Labs  Lab 07/08/24 0426 07/08/24 2003 07/09/24 0555 07/10/24 0313 07/11/24 0005 07/11/24 0511 07/12/24 0402  NA 133*  --  134* 138 140 138 139  K 2.9*   < > 3.1* 3.5 4.0 3.7 3.9  CL 96*  --  100 101 101 99 104  CO2 23  --  22 21* 20* 21* 19*  GLUCOSE 128*  --  105* 106* 152* 157* 197*  BUN 40*  --  55* 56* 60* 62* 69*  CREATININE 1.42*  --  1.77* 1.80* 1.96* 2.08* 2.26*  CALCIUM 7.4*  --  7.9* 7.7* 8.0* 7.9* 8.0*  MG 2.3  --  2.6* 2.3  --  2.2 2.3  PHOS 3.9  --  3.1 3.4 3.8 3.5 5.9*   < > = values in this interval not displayed.   GFR: Estimated Creatinine Clearance: 28.9 mL/min (A) (by C-G formula based on SCr of 2.26 mg/dL (H)). Recent Labs  Lab 07/08/24 1110 07/09/24 0555 07/10/24 0912 07/11/24 0005 07/11/24 0511 07/11/24 1207 07/11/24 1613 07/11/24 1619 07/12/24 0004 07/12/24 0402  WBC  --    < >  --    < > 26.0*  25.8*  --   --  27.9* 27.7* 27.1*  LATICACIDVEN 1.3  --  1.4  --   --  2.3* 2.8*  --   --   --    < > = values in this interval not displayed.    Liver Function Tests: Recent Labs  Lab 07/08/24 0426 07/09/24 0555 07/10/24 0313 07/11/24 0005 07/11/24 0511 07/12/24 0402  AST 100* 103* 94*  --  95* 78*  ALT 25 27 25   --  23 22  ALKPHOS 118 132* 119  --  114 119  BILITOT 8.3* 11.0* 10.1*  --  12.1* 12.5*  PROT 5.4* 6.3* 6.0*  --  6.4* 6.8  ALBUMIN 1.7* 1.9* 1.6* 2.1* 2.2* 2.1*   Recent Labs  Lab 07/07/24 1210 07/11/24 0511  LIPASE 16 17   Recent Labs  Lab 07/07/24 1210 07/10/24 0313  AMMONIA  46* 46*    ABG    Component Value Date/Time   PHART 7.34 (L) 07/10/2024 1200   PCO2ART 38 07/10/2024 1200   PO2ART 127 (H) 07/10/2024 1200   HCO3 20.5 07/10/2024 1200   ACIDBASEDEF 4.8 (H) 07/10/2024 1200   O2SAT 78.7 07/12/2024 0004      Coagulation Profile: Recent Labs  Lab 07/07/24 1210 07/08/24 1110 07/09/24 1052 07/11/24 0511  INR 1.7* 1.5* 1.5* 1.6*    Cardiac Enzymes: No results for input(s): CKTOTAL, CKMB, CKMBINDEX, TROPONINI in the last 168 hours.  HbA1C: No results found for: HGBA1C  CBG: Recent Labs  Lab 07/11/24 1628 07/11/24 1948 07/11/24 2327 07/12/24 0356 07/12/24 0733  GLUCAP 163* 175* 184* 172* 152*    Review of Systems: Positives in BOLD   Unable to assess pt mechanically   Past Medical History:  He,  has a past medical history of Seizures (HCC).   Surgical History:  No past surgical history on file.   Social History:   reports that he has quit smoking. His smoking use included cigarettes. He has never used smokeless tobacco. He reports current alcohol use of about 7.0 standard drinks of alcohol per week. He reports current drug use. Drug: Marijuana.   Family History:  His family history is not on file.   Allergies No Known Allergies   Home Medications  Prior to Admission medications   Medication Sig Start Date End Date Taking? Authorizing Provider  chlordiazePOXIDE  (LIBRIUM ) 25 MG capsule Take 1 capsule 5 times a day on day 1, then decrease by one capsule daily until gone 01/06/16   Trudy Dorn BRAVO, MD  ciprofloxacin -dexamethasone  (CIPRODEX ) OTIC suspension Place 4 drops into the right ear 2 (two) times daily. 05/22/20   Lamptey, Aleene KIDD, MD  cloNIDine  (CATAPRES  - DOSED IN MG/24 HR) 0.1 mg/24hr patch Place 1 patch (0.1 mg total) onto the skin every 7 (seven) days. 01/06/16   Trudy Dorn BRAVO, MD     Critical care time: 60 minutes      Aarian Cleaver, PA-C [facility] PCCM Team Contact Info: [phone]

## 2024-07-12 NOTE — Progress Notes (Signed)
 Palliative Care Progress Note, Assessment & Plan   Patient Name: Sergio Curry       Date: 07/12/2024 DOB: 01-27-1961  Age: 63 y.o. MRN#: [id number] Attending Physician: Malka Domino, MD Primary Care Physician: Patient, No Pcp Per Admit Date: 07/07/2024  Subjective: Patient is lying in bed, intubated and sedated.  His significant other Sergio Curry is at bedside during my visit.  HPI: 63 y.o. male  with past medical history of EtOH abuse with alcoholic withdrawal seizures, liver disease, and bilateral hip replacement admitted on 07/07/2024 with AMS and jaundice.   Per chart review, patient presented to ED hypothermic and confirmed he has liver disease while also drinking a few beers a day.   Patient is being treated for mixed distributive and septic shock likely due to sepsis with multiorgan failure, toxic metabolic encephalopathy secondary to alcohol use disorder with acute alcoholic hepatitis and liver failure   Cultures revealed staph epidermidis bacteremia that was possibly a contaminant.  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  Cardiac MRI and repeat blood cultures pending.   Course of hospitalization and significant hospital events are as follows: 10/25: Admitted with acute toxic metabolic encephalopathy, sepsis, possible colitis, alcoholic hepatitis, severe lactic acidosis, macrocytic anemia, and pancytopenia.  Pt received 2 units of pRBC's and 2 units of platelets  10/26: Pt showed signs of ETOH withdrawal overnight requiring ativan  per CIWA protocol once.  CBC stable at this time.  GI consulted and will see pt today  10/27: Pt is on 5L [place]. Remains in withdrawal and not oriented. Platelets 12 this am. Requiring prn Ativan . 10/28: Critically ill  appearing with increasing oxygen requirements, on HHFNC this am and transitioned to Bipap.  Required mechanical intubation due to decline in respiratory status.  Worsening kidney function, as well as elevated BNP requiring lasix 80mg  x 1. Hypotension requiring levophed gtt.  10/29: Pt remains mechanically intubated FiO2 50%.  Remains on dobutamine gtt @2 .5 mcg/kg/min; levophed gtt @20  mcg/min, and vasopressin gtt @0 .03 units.  Now with worsening renal function and hyperbilirubinemia. Pending TEE today . 10/30: Remains mechanically intubated with FiO2 requirements at 40%, peep 5. Remains on levo 12, vaso 0.03 and dobutamine 2.5. Will discontinue dobutamine today given recent Coox of 77. Renal function worsening (Cr 2.26) and Tbili 12.5.    PMT was consulted to support patient and family with goals of care discussions.  Summary of counseling/coordination of care: Extensive chart review completed prior to meeting patient including labs, vital signs, imaging, progress notes, orders, and available advanced directive documents from current and previous encounters.   CCM met independently with the patient and significant other this morning.  After reviewing the patient's chart and assessing the patient at bedside, I spoke with patient's significant other Sergio Curry in regards to symptom management and goals of care.   Attempted to gauge Tracy's understanding of patient's current medical situation.  She shares she has visited with the critical care doctor this morning already.  She shares that patient is not showing signs of improvement.  She understands that some of his oxygen requirements from the ventilator have been turned down.  However, he remains in multiorgan failure.  Discussed that dobutamine will  be discontinued today.  Levo and vaso will remain to support patient's blood pressures.  She shares understanding that he continues to remain critically ill with high risk of decompensation.  She shares she  discussed DNR with attending this morning and feels relieved that he is in agreement with DNR as well.   CCM met independently with the patient and significant other this morning.  Sergio Curry shares outcome of this discussion with CCM is that plan is to give patient 24 hours and that they will reconvene to discuss further goals and next steps for patient's tomorrow, Friday, at 1 PM.  No change to plan of care at this time.  Therapeutic silence, active listening, and emotional support provided.  Spiritual care consult offered and Sergio Curry politely declined at this time.  PMT will continue to follow and support.  Physical Exam Vitals reviewed.  Constitutional:      General: He is not in acute distress.    Appearance: He is ill-appearing.  HENT:     Head: Normocephalic.  Cardiovascular:     Pulses: Normal pulses.  Pulmonary:     Comments: Mechanical ventilatory support Abdominal:     Palpations: Abdomen is soft.  Skin:    General: Skin is warm and dry.     Coloration: Skin is jaundiced.             35 minute visit includes: Detailed review of medical records (labs, imaging, vital signs), medically appropriate exam (mental status, respiratory, cardiac, skin), discussed with treatment team, counseling and educating patient, family and staff, documenting clinical information, medication management and coordination of care.  Lamarr L. Arvid, DNP, FNP-BC Palliative Medicine Team

## 2024-07-12 NOTE — Plan of Care (Signed)
   Problem: Clinical Measurements: Goal: Will remain free from infection Outcome: Progressing Goal: Diagnostic test results will improve Outcome: Progressing Goal: Respiratory complications will improve Outcome: Progressing Goal: Cardiovascular complication will be avoided Outcome: Progressing   Problem: Safety: Goal: Ability to remain free from injury will improve Outcome: Progressing

## 2024-07-12 NOTE — Progress Notes (Signed)
 Date of Admission:  07/07/2024      ID: Sergio Curry is a 63 y.o. male  Principal Problem:   Acute metabolic encephalopathy Active Problems:   Staphylococcus epidermidis bacteremia   Liver disease   Altered mental status   Acute hypoxic respiratory failure (HCC)   Malnutrition of moderate degree    Subjective: Remains intubated  Medications:   vitamin C  500 mg Per Tube BID   Chlorhexidine Gluconate Cloth  6 each Topical Daily   docusate  100 mg Per Tube BID   feeding supplement (PIVOT 1.5 CAL)  1,000 mL Per Tube Q24H   folic acid  1 mg Intravenous Daily   free water  30 mL Per Tube Q4H   hydrocortisone sod succinate (SOLU-CORTEF) inj  50 mg Intravenous Q8H   insulin aspart  1-3 Units Subcutaneous Q4H   lactulose  20 g Per NG tube BID   mouth rinse  15 mL Mouth Rinse Q2H   pantoprazole (PROTONIX) IV  40 mg Intravenous Q12H   polyethylene glycol  17 g Per Tube Daily   rifaximin  550 mg Per Tube BID   sodium chloride  flush  10-40 mL Intracatheter Q12H   thiamine (VITAMIN B1) injection  100 mg Intravenous Daily   vancomycin variable dose per unstable renal function (pharmacist dosing)   Does not apply See admin instructions    Objective: Vital signs in last 24 hours: BP (!) 126/43   Pulse 83   Temp 97.9 F (36.6 C) (Axillary)   Resp 20   Ht 5' 9 (1.753 m)   Wt 61.8 kg   SpO2 95%   BMI 20.12 kg/m       PHYSICAL EXAM: Intubated  Sedated No change in his examination Head: Normocephalic, without obvious abnormality, atraumatic. Eyes: Conjunctivae clear, icteric sclerae. Pupils are equal Neck: Supple, symmetrical, no adenopathy, thyroid: non tender no carotid bruit and no JVD.  Lungs:b/l air entry Heart: Tachycardia Abdomen: Soft, non-tender,not distended. Bowel sounds normal. No masses Extremities: atraumatic, no cyanosis. No edema. No clubbing Skin: bruising, petechiae Lymph: Cervical, supraclavicular normal. Neurologic: cannot assess  Lab  Results    Latest Ref Rng & Units 07/12/2024   11:53 AM 07/12/2024    4:02 AM 07/12/2024   12:04 AM  CBC  WBC 4.0 - 10.5 K/uL 27.0  27.1  27.7   Hemoglobin 13.0 - 17.0 g/dL 8.9  9.1  9.1   Hematocrit 39.0 - 52.0 % 26.7  27.4  26.9   Platelets 150 - 400 K/uL 36  41  44        Latest Ref Rng & Units 07/12/2024    4:02 AM 07/11/2024    5:11 AM 07/11/2024   12:05 AM  CMP  Glucose 70 - 99 mg/dL 802  842  847   BUN 8 - 23 mg/dL 69  62  60   Creatinine 0.61 - 1.24 mg/dL 7.73  7.91  8.03   Sodium 135 - 145 mmol/L 139  138  140   Potassium 3.5 - 5.1 mmol/L 3.9  3.7  4.0   Chloride 98 - 111 mmol/L 104  99  101   CO2 22 - 32 mmol/L 19  21  20    Calcium 8.9 - 10.3 mg/dL 8.0  7.9  8.0   Total Protein 6.5 - 8.1 g/dL 6.8  6.4    Total Bilirubin 0.0 - 1.2 mg/dL 87.4  87.8    Alkaline Phos 38 - 126 U/L 119  114  AST 15 - 41 U/L 78  95    ALT 0 - 44 U/L 22  23        Microbiology: 07/07/24 MSSE 07/09/24 BC- NG Studies/Results: DG Chest Port 1 View Result Date: 07/12/2024 EXAM: 1 VIEW(S) XRAY OF THE CHEST 07/12/2024 09:04:00 AM COMPARISON: 2 days ago. CLINICAL HISTORY: [id number] Respiratory failure requiring intubation (HCC) [id number] Respiratory failure requiring intubation (HCC) FINDINGS: LINES, TUBES AND DEVICES: Endotracheal and nasogastric tubes are unchanged. Stable left internal jugular catheter. LUNGS AND PLEURA: Bilateral patchy airspace opacities most consistent with multifocal pneumonia. No pulmonary edema. No pleural effusion. No pneumothorax. HEART AND MEDIASTINUM: No acute abnormality of the cardiac and mediastinal silhouettes. BONES AND SOFT TISSUES: Stable left rib fractures. IMPRESSION: 1. Stable bilateral patchy airspace opacities, consistent with multifocal pneumonia. 2. Stable left rib fractures. Electronically signed by: Lynwood Seip MD 07/12/2024 09:10 AM EDT RP Workstation: [id number]     Assessment/Plan: 63 year old male with history of alcohol abuse presenting with  altered mental status   Metabolic encephalopathy Patient is on lactulose    Acute hypoxic respiratory failure secondary to pulmonary edema Patient has been intubated   Bicytopenia with severe anemia and thrombocytopenia  leucocytosis- persist- will escalate antibiotic to meropenem  MSSE bacteremia 4 out of 4 blood cultures have methicillin-susceptible Staph epidermidis.  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

## 2024-07-12 NOTE — Consult Note (Signed)
 PHARMACY CONSULT NOTE - ELECTROLYTES  Pharmacy Consult for Electrolyte Monitoring and Replacement   Recent Labs: Height: 5' 9 (175.3 cm) Weight: 61.8 kg (136 lb 3.9 oz) IBW/kg (Calculated) : 70.7 Estimated Creatinine Clearance: 28.9 mL/min (A) (by C-G formula based on SCr of 2.26 mg/dL (H)). Potassium (mmol/L)  Date Value  07/12/2024 3.9   Magnesium (mg/dL)  Date Value  89/69/7974 2.3   Calcium (mg/dL)  Date Value  89/69/7974 8.0 (L)   Albumin (g/dL)  Date Value  89/69/7974 2.1 (L)   Phosphorus (mg/dL)  Date Value  89/69/7974 5.9 (H)   Sodium (mmol/L)  Date Value  07/12/2024 139   Corrected Ca: 9.5 mg/dL  Assessment  Sergio Curry is a 63 y.o. male presenting with altered mental status and jaundice. PMH significant for seizures and liver disease. Pharmacy has been consulted to monitor and replace electrolytes.  Diet: NPO   Pertinent medications: n/a  Goal of Therapy: Electrolytes WNL  Plan:  No electrolyte replacement indicated at this time.  Check BMP, Mg, Phos with AM labs  Thank you for allowing pharmacy to be a part of this patient's care.  Bernardino George, PharmD Candidate [phone] [facility] [employer] 07/12/2024 7:13 AM

## 2024-07-12 NOTE — Progress Notes (Signed)
 [facility] CLINIC CARDIOLOGY PROGRESS NOTE       Patient ID: Sergio Curry MRN: [id number] DOB/AGE: 64/26/1962 64 y.o.  Admit date: 07/07/2024 Referring Physician Lonell Moose, NP Primary Physician Patient, No Pcp Per  Primary Cardiologist None Reason for Consultation Possible SBE  HPI: Sergio Curry is a 63 y.o. male  with a past medical history of alcohol use, liver disease who presented to the ED on 07/07/2024 for altered mental status and jaundice. Echo this admission with concern for SBE of anterior leaflet of aortic valve. Cardiology was consulted for further evaluation.   Interval history: -Patient seen and examined this AM, remains intubated and sedated. -Creatinine uptrending. Platelets overall improved.  -TEE yesterday cancelled due to patients worsening multiorgan failure and multiple pressor requirement.  Review of systems complete and found to be negative unless listed above    Past Medical History:  Diagnosis Date   Seizures (HCC)     No past surgical history on file.  No medications prior to admission.   Social History   Socioeconomic History   Marital status: Single    Spouse name: Not on file   Number of children: Not on file   Years of education: Not on file   Highest education level: Not on file  Occupational History   Not on file  Tobacco Use   Smoking status: Former    Types: Cigarettes   Smokeless tobacco: Never  Substance and Sexual Activity   Alcohol use: Yes    Alcohol/week: 7.0 standard drinks of alcohol    Types: 4 Cans of beer, 3 Shots of liquor per week   Drug use: Yes    Types: Marijuana   Sexual activity: Not Currently    Birth control/protection: None  Other Topics Concern   Not on file  Social History Narrative   Not on file   Social Drivers of Health   Financial Resource Strain: Not on file  Food Insecurity: No Food Insecurity (07/07/2024)   Hunger Vital Sign    Worried About Running Out of Food in the Last Year: Never  true    Ran Out of Food in the Last Year: Never true  Transportation Needs: No Transportation Needs (07/07/2024)   PRAPARE - Administrator, Civil Service (Medical): No    Lack of Transportation (Non-Medical): No  Physical Activity: Not on file  Stress: Not on file  Social Connections: Not on file  Intimate Partner Violence: Not At Risk (07/07/2024)   Humiliation, Afraid, Rape, and Kick questionnaire    Fear of Current or Ex-Partner: No    Emotionally Abused: No    Physically Abused: No    Sexually Abused: No    No family history on file.   Vitals:   07/12/24 0419 07/12/24 0500 07/12/24 0543 07/12/24 0615  BP:  (!) 136/46 (!) 134/56 (!) 130/46  Pulse:  93 95 89  Resp:  (!) 23 (!) 26 20  Temp:      TempSrc:      SpO2:  100% 97% 95%  Weight: 61.8 kg     Height:        PHYSICAL EXAM General: Intubated, sedated. HEENT: Normocephalic and atraumatic. Neck: No JVD.  Lungs: Mechanical breath sounds. Heart: HRRR. Normal S1 and S2 without gallops or murmurs.  Abdomen: Non-distended appearing.  Msk: Normal strength and tone for age. Extremities: Warm and well perfused. No clubbing, cyanosis. Pitting edema b/l feet.   Labs: Basic Metabolic Panel: Recent Labs    07/11/24  9488 07/12/24 0402  NA 138 139  K 3.7 3.9  CL 99 104  CO2 21* 19*  GLUCOSE 157* 197*  BUN 62* 69*  CREATININE 2.08* 2.26*  CALCIUM 7.9* 8.0*  MG 2.2 2.3  PHOS 3.5 5.9*   Liver Function Tests: Recent Labs    07/11/24 0511 07/12/24 0402  AST 95* 78*  ALT 23 22  ALKPHOS 114 119  BILITOT 12.1* 12.5*  PROT 6.4* 6.8  ALBUMIN 2.2* 2.1*   Recent Labs    07/11/24 0511  LIPASE 17    CBC: Recent Labs    07/11/24 1619 07/12/24 0004 07/12/24 0402  WBC 27.9* 27.7* 27.1*  NEUTROABS 23.3* 22.6*  --   HGB 6.8* 9.1* 9.1*  HCT 20.9* 26.9* 27.4*  MCV 102.5* 97.5 98.9  PLT 53* 44* 41*   Cardiac Enzymes: No results for input(s): CKTOTAL, CKMB, CKMBINDEX, TROPONINIHS in the last  72 hours.  BNP: Recent Labs    07/09/24 1841  BNP 2,360.8*   D-Dimer: Recent Labs    07/09/24 1052  DDIMER 2.68*   Hemoglobin A1C: No results for input(s): HGBA1C in the last 72 hours. Fasting Lipid Panel: Recent Labs    07/11/24 0511  TRIG 148   Thyroid Function Tests: No results for input(s): TSH, T4TOTAL, T3FREE, THYROIDAB in the last 72 hours.  Invalid input(s): FREET3 Anemia Panel: No results for input(s): VITAMINB12, FOLATE, FERRITIN, TIBC, IRON, RETICCTPCT in the last 72 hours.    Radiology: DG Abd 1 View Result Date: 07/10/2024 CLINICAL DATA:  NG placement. EXAM: ABDOMEN - 1 VIEW COMPARISON:  CT abdomen pelvis dated 07/07/2024. FINDINGS: Enteric tube with tip and side-port in the left upper abdomen the proximal stomach. No bowel dilatation. No acute osseous pathology. IMPRESSION: Enteric tube with tip and side-port in the proximal stomach. Electronically Signed   By: Vanetta Chou M.D.   On: 07/10/2024 14:36   DG Chest Port 1 View Result Date: 07/10/2024 CLINICAL DATA:  Central line placement. EXAM: PORTABLE CHEST 1 VIEW COMPARISON:  Chest radiograph dated 07/10/2024. FINDINGS: Left IJ central venous line with tip over central SVC. Endotracheal tube approximately 5 cm above the carina and enteric tube extends below diaphragm with tip beyond the inferior margin of the image. Bilateral pulmonary opacities as seen on the earlier radiograph. No pneumothorax. Stable cardiac silhouette. No acute osseous pathology. IMPRESSION: Left IJ central venous line with tip over central SVC. No pneumothorax. Electronically Signed   By: Vanetta Chou M.D.   On: 07/10/2024 14:34   DG Chest Port 1 View Result Date: 07/10/2024 EXAM: 1 VIEW XRAY OF THE CHEST 07/10/2024 11:13:29 AM COMPARISON: Same day. CLINICAL HISTORY: [id number] Encounter for intubation [id number]. Encounter for intubation Encounter for intubation FINDINGS: LINES, TUBES AND DEVICES: Endotracheal tube  tip is seen 1 cm above the carina and directed slightly towards the right mainstem bronchus. Withdrawal by 2 to 3 cm is recommended. LUNGS AND PLEURA: Opacities are again noted consistent with pneumonia or possibly edema. No pleural effusion. No pneumothorax. HEART AND MEDIASTINUM: No acute abnormality of the cardiac and mediastinal silhouettes. BONES AND SOFT TISSUES: No acute osseous abnormality. IMPRESSION: 1. Endotracheal tube tip 1 cm above the carina with slight right mainstem orientation; recommend withdrawal by 23 cm. 2. Opacities consistent with pneumonia or edema. Electronically signed by: Lynwood Seip MD 07/10/2024 11:30 AM EDT RP Workstation: [id number]   DG Chest Port 1 View Result Date: 07/10/2024 EXAM: 1 VIEW(S) XRAY OF THE CHEST 07/10/2024 03:26:54 AM COMPARISON: Chest x-ray dated  11/10/2023. CLINICAL HISTORY: 200808 Hypoxia FINDINGS: LUNGS AND PLEURA: Interval increase in bilateral perihilar, right greater than left, streaky consolidative airspace opacity. Left upper lobe patchy airspace opacities. Blunting of the right costophrenic angle with likely underlying trace pleural effusion. Left costophrenic angle is collimated off view. No pulmonary edema. No pneumothorax. HEART AND MEDIASTINUM: No acute abnormality of the cardiac and mediastinal silhouettes. BONES AND SOFT TISSUES: No acute osseous abnormality. IMPRESSION: 1. Interval increase in bilateral perihilar, right greater than left, consolidative and patchy airspace opacities, most consistent with multifocal pneumonia or aspiration pneumonitis. 2. Blunting of the right costophrenic angle with likely trace pleural effusion. Electronically signed by: Morgane Naveau MD 07/10/2024 03:47 AM EDT RP Workstation: [id number]   DG Chest Port 1 View Result Date: 07/09/2024 EXAM: 1 VIEW(S) XRAY OF THE CHEST 07/09/2024 04:45:00 PM COMPARISON: Chest x-ray 07/07/2024. CLINICAL HISTORY: [id number] Dyspnea [id number]. Dyspnea FINDINGS: LUNGS AND PLEURA: There  is new bilateral multifocal airspace consolidation, right greater than left. This is predominantly central in location. No pulmonary edema. No pleural effusion. No pneumothorax. HEART AND MEDIASTINUM: No acute abnormality of the cardiac and mediastinal silhouettes. BONES AND SOFT TISSUES: There are multiple healed left-sided rib fractures. IMPRESSION: 1. New bilateral multifocal airspace consolidation, right greater than left, predominantly central in location. Electronically signed by: Greig Pique MD 07/09/2024 09:42 PM EDT RP Workstation: [id number]   MR CARDIAC MORPHOLOGY W WO CONTRAST Result Date: 07/09/2024 CLINICAL DATA:  Aortic regurgitation, possible aortic vegetation EXAM: CARDIAC MRI TECHNIQUE: The patient was scanned on a 1.5 Tesla Siemens magnet. A dedicated cardiac coil was used. Functional imaging was done using Fiesta sequences. 2,3, and 4 chamber views were done to assess for RWMA's. Modified Simpson's rule using a short axis stack was used to calculate an ejection fraction on a dedicated work [employer]. The patient received 8 cc of Gadavist. After 10 minutes inversion recovery sequences were used to assess for infiltration and scar tissue. Velocity flow mapping performed in the ascending aorta and main pulmonary artery. CONTRAST:  8 cc  of Gadavist FINDINGS: 1. Normal left ventricular size and thickness. Mildly reduced LV systolic function (LVEF = 45%). There is global hypokinesis. There is no late gadolinium enhancement in the left ventricular myocardium. LVEDV: 172 ml LVESV: 94 ml SV: 77 ml CO: 4 L/min Myocardial mass: 90 g 2. Normal right ventricular size, thickness and low normal systolic function. There are no regional wall motion abnormalities. 3.  Normal left and right atrial size. 4. Normal size of the aortic root, ascending aorta and pulmonary artery. 5. Aortic valve prolapse, moderate aortic valve regurgitation. Regurgitant volume 51 ml, regurgitant fraction 53%.  6.  Normal pericardium.  No pericardial effusion. IMPRESSION: 1.  Mildly reduced LV systolic function.  LVEF 45% 2.  No LGE or scar. 3.  Aortic valve prolapse and moderate aortic regurgitation. 4.  Poor image quality, cannot rule out aortic valve vegetation. 5.  Normal RV systolic function. Electronically Signed   By: Redell Cave M.D.   On: 07/09/2024 17:11   MR CARDIAC VELOCITY FLOW MAP Result Date: 07/09/2024 CLINICAL DATA:  Aortic regurgitation, possible aortic vegetation EXAM: CARDIAC MRI TECHNIQUE: The patient was scanned on a 1.5 Tesla Siemens magnet. A dedicated cardiac coil was used. Functional imaging was done using Fiesta sequences. 2,3, and 4 chamber views were done to assess for RWMA's. Modified Simpson's rule using a short axis stack was used to calculate an ejection fraction on a dedicated work [employer]. The patient  received 8 cc of Gadavist. After 10 minutes inversion recovery sequences were used to assess for infiltration and scar tissue. Velocity flow mapping performed in the ascending aorta and main pulmonary artery. CONTRAST:  8 cc  of Gadavist FINDINGS: 1. Normal left ventricular size and thickness. Mildly reduced LV systolic function (LVEF = 45%). There is global hypokinesis. There is no late gadolinium enhancement in the left ventricular myocardium. LVEDV: 172 ml LVESV: 94 ml SV: 77 ml CO: 4 L/min Myocardial mass: 90 g 2. Normal right ventricular size, thickness and low normal systolic function. There are no regional wall motion abnormalities. 3.  Normal left and right atrial size. 4. Normal size of the aortic root, ascending aorta and pulmonary artery. 5. Aortic valve prolapse, moderate aortic valve regurgitation. Regurgitant volume 51 ml, regurgitant fraction 53%. 6.  Normal pericardium.  No pericardial effusion. IMPRESSION: 1.  Mildly reduced LV systolic function.  LVEF 45% 2.  No LGE or scar. 3.  Aortic valve prolapse and moderate aortic regurgitation. 4.   Poor image quality, cannot rule out aortic valve vegetation. 5.  Normal RV systolic function. Electronically Signed   By: Redell Cave M.D.   On: 07/09/2024 17:11   MR CARDIAC VELOCITY FLOW MAP Result Date: 07/09/2024 CLINICAL DATA:  Aortic regurgitation, possible aortic vegetation EXAM: CARDIAC MRI TECHNIQUE: The patient was scanned on a 1.5 Tesla Siemens magnet. A dedicated cardiac coil was used. Functional imaging was done using Fiesta sequences. 2,3, and 4 chamber views were done to assess for RWMA's. Modified Simpson's rule using a short axis stack was used to calculate an ejection fraction on a dedicated work [employer]. The patient received 8 cc of Gadavist. After 10 minutes inversion recovery sequences were used to assess for infiltration and scar tissue. Velocity flow mapping performed in the ascending aorta and main pulmonary artery. CONTRAST:  8 cc  of Gadavist FINDINGS: 1. Normal left ventricular size and thickness. Mildly reduced LV systolic function (LVEF = 45%). There is global hypokinesis. There is no late gadolinium enhancement in the left ventricular myocardium. LVEDV: 172 ml LVESV: 94 ml SV: 77 ml CO: 4 L/min Myocardial mass: 90 g 2. Normal right ventricular size, thickness and low normal systolic function. There are no regional wall motion abnormalities. 3.  Normal left and right atrial size. 4. Normal size of the aortic root, ascending aorta and pulmonary artery. 5. Aortic valve prolapse, moderate aortic valve regurgitation. Regurgitant volume 51 ml, regurgitant fraction 53%. 6.  Normal pericardium.  No pericardial effusion. IMPRESSION: 1.  Mildly reduced LV systolic function.  LVEF 45% 2.  No LGE or scar. 3.  Aortic valve prolapse and moderate aortic regurgitation. 4.  Poor image quality, cannot rule out aortic valve vegetation. 5.  Normal RV systolic function. Electronically Signed   By: Redell Cave M.D.   On: 07/09/2024 17:11   ECHOCARDIOGRAM  COMPLETE Result Date: 07/08/2024    ECHOCARDIOGRAM REPORT   Patient Name:   ELDRED SOOY Date of Exam: 07/08/2024 Medical Rec #:  [id number]        Height:       69.0 in Accession #:    [id number]       Weight:       129.4 lb Date of Birth:  08-15-1961         BSA:          1.717 m Patient Age:    63 years         BP:  133/32 mmHg Patient Gender: M                HR:           84 bpm. Exam Location:  [facility] Procedure: 2D Echo, Cardiac Doppler and Color Doppler (Both Spectral and Color            Flow Doppler were utilized during procedure). Indications:     Elevated Troponin  History:         Patient has no prior history of Echocardiogram examinations.  Sonographer:     Thedora Louder RDCS, FASE Referring Phys:  [id number] LONELL KANDICE MOOSE Diagnosing Phys: Cara JONETTA Lovelace MD  Sonographer Comments: Technically difficult study due to poor echo windows. Image acquisition challenging due to patient behavioral factors. IMPRESSIONS  1. Possible SBE of anterior leaflet. Recommend TEE.  2. Technically difficult study.  3. Left ventricular ejection fraction, by estimation, is 45 to 50%. The left ventricle has mildly decreased function. The left ventricle demonstrates global hypokinesis. The left ventricular internal cavity size was moderately to severely dilated. Left ventricular diastolic function could not be evaluated.  4. Right ventricular systolic function is normal. The right ventricular size is normal.  5. Left atrial size was mildly dilated.  6. The mitral valve is myxomatous. Trivial mitral valve regurgitation.  7. 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 SBE can not be excluded. Consider TEE.. The aortic valve is calcified. Aortic valve regurgitation is mild to moderate. Aortic valve sclerosis/calcification is present, without any evidence of aortic stenosis. Conclusion(s)/Recommendation(s): Poor windows for evaluation of left ventricular function by transthoracic  echocardiography. Would recommend an alternative means of evaluation. Findings concerning for aortic valve vegetation, would recommend a Transesophageal Echocardiogram for clarification. FINDINGS  Left Ventricle: Left ventricular ejection fraction, by estimation, is 45 to 50%. The left ventricle has mildly decreased function. The left ventricle demonstrates global hypokinesis. Strain was performed and the global longitudinal strain is indeterminate. The left ventricular internal cavity size was moderately to severely dilated. There is no left ventricular hypertrophy. Left ventricular diastolic function could not be evaluated. Right Ventricle: The right ventricular size is normal. No increase in right ventricular wall thickness. Right ventricular systolic function is normal. Left Atrium: Left atrial size was mildly dilated. Right Atrium: Right atrial size was normal in size. Pericardium: There is no evidence of pericardial effusion. Mitral Valve: The mitral valve is myxomatous. Trivial mitral valve regurgitation. Tricuspid Valve: The tricuspid valve is normal in structure. Tricuspid valve regurgitation is trivial. Aortic Valve: Mobile mass on anterior leaflet prolapsing inyo the outflow tract. SBE can not be excluded. Consider TEE. The aortic valve is calcified. Aortic valve regurgitation is mild to moderate. Aortic valve sclerosis/calcification is present, without any evidence of aortic stenosis. Aortic valve peak gradient measures 8.2 mmHg. Pulmonic Valve: The pulmonic valve was normal in structure. Pulmonic valve regurgitation is not visualized. Aorta: The ascending aorta was not well visualized. IAS/Shunts: No atrial level shunt detected by color flow Doppler. Additional Comments: Possible SBE of anterior leaflet. Recommend TEE. Technically difficult study. 3D was performed not requiring image post processing on an independent workstation and was indeterminate.  LEFT VENTRICLE PLAX 2D LVIDd:         6.10 cm  LVIDs:         4.70 cm LV PW:         1.10 cm LV IVS:        1.00 cm  LV Volumes (MOD) LV vol d,  MOD A4C: 90.4 ml LV vol s, MOD A4C: 29.6 ml LV SV MOD A4C:     90.4 ml LEFT ATRIUM           Index LA diam:      4.10 cm 2.39 cm/m LA Vol (A4C): 26.0 ml 15.14 ml/m  AORTIC VALVE              PULMONIC VALVE AV Vmax:      143.00 cm/s RVOT Peak grad: 1 mmHg AV Peak Grad: 8.2 mmHg LVOT Vmax:    91.80 cm/s LVOT Vmean:   62.000 cm/s LVOT VTI:     0.196 m  AORTA Ao Asc diam: 3.10 cm  SHUNTS Systemic VTI: 0.20 m Cara JONETTA Lovelace MD Electronically signed by Cara JONETTA Lovelace MD Signature Date/Time: 07/08/2024/11:40:39 AM    Final    US  ABDOMEN LIMITED WITH LIVER DOPPLER Result Date: 07/08/2024 CLINICAL DATA:  63 year old male with history of hyperbilirubinemia. EXAM: DUPLEX ULTRASOUND OF LIVER TECHNIQUE: Color and duplex Doppler ultrasound was performed to evaluate the hepatic in-flow and out-flow vessels. COMPARISON:  None Available. FINDINGS: Liver: Diffusely coarsened echotexture with increased echogenicity normal hepatic contour without nodularity. No focal lesion, mass or intrahepatic biliary ductal dilatation. Main Portal Vein size: 0.83 cm Portal Vein Velocities Main Prox:  23 cm/sec, antegrade Main Dist:  23 cm/sec, antegrade Right: Not visualized. Left: Not visualized. Hepatic Vein Velocities Right: Not visualized. Middle:  14.5 cm/sec Left: Not visualized. IVC: Present and patent with normal respiratory phasicity. Hepatic Artery Velocity:  216 cm/sec Splenic Vein Velocity: Not visualized. Spleen: 8.8 cm x 8.1 cm x 3.6 cm with a total volume of 134 cm^3 (411 cm^3 is upper limit normal) Portal Vein Occlusion/Thrombus: No Splenic Vein Occlusion/Thrombus: No Ascites: None Varices: None IMPRESSION: 1. Limited evaluation. The visualized portal vasculature is patent with antegrade flow. 2. Diffusely coarsened and echogenic hepatic parenchyma as could be seen with hepatic steatosis or acute hepatitis. 3. No evidence of  biliary ductal dilation. Ester Sides, MD [facility] Radiology Electronically Signed   By: Ester Sides M.D.   On: 07/08/2024 06:30   CT ABDOMEN PELVIS W CONTRAST Result Date: 07/07/2024 CLINICAL DATA:  Acute abdominal pain. Altered mental status and jaundice. EXAM: CT ABDOMEN AND PELVIS WITH CONTRAST TECHNIQUE: Multidetector CT imaging of the abdomen and pelvis was performed using the standard protocol following bolus administration of intravenous contrast. RADIATION DOSE REDUCTION: This exam was performed according to the departmental dose-optimization program which includes automated exposure control, adjustment of the mA and/or kV according to patient size and/or use of iterative reconstruction technique. CONTRAST:  75mL OMNIPAQUE IOHEXOL 300 MG/ML  SOLN COMPARISON:  None Available. FINDINGS: Lower chest: Bandlike areas of atelectasis within both lower lobes. Trace pleural thickening without significant effusion. Hepatobiliary: Enlarged liver spanning 18.9 cm cranial caudal. Advanced hepatic steatosis. Allowing for motion artifact, no focal liver abnormality. There may be subtle capsular nodularity, although motion obscures assessment. Small gallstone within minimally distended gallbladder. No biliary dilatation. Pancreas: Parenchymal atrophy. No ductal dilatation or inflammation. No evidence of pancreatic mass. Spleen: No splenomegaly.  No focal splenic abnormality. Adrenals/Urinary Tract: No adrenal nodule. No hydronephrosis. Symmetric bilateral perinephric stranding. No renal calculi or suspicious renal abnormality. Absent excretion on delayed phase imaging. Partially distended urinary bladder, mildly thick walled. Stomach/Bowel: Small hiatal hernia. Decompressed stomach. Small bowel is decompressed. Normal appendix is tentatively visualized, regardless no appendicitis. Mild wall thickening about the ascending and hepatic flexure of the colon. Moderate  stool within the left colon. No bowel obstruction. Vascular/Lymphatic: Aortic atherosclerosis. No aneurysm. The portal vein is patent allowing for motion. No abdominopelvic adenopathy. Reproductive: Prostate is unremarkable. Other: Trace free fluid in the pelvis, but no significant ascites. No free air. No abdominal wall hernia. Musculoskeletal: Remote left rib fractures. Chronic hip arthropathy and postsurgical change. No acute osseous findings. IMPRESSION: 1. Hepatomegaly and advanced hepatic steatosis. There may be subtle capsular nodularity, although motion obscures assessment. Recommend correlation with any clinical or laboratory findings of cirrhosis. 2. Mild wall thickening about the ascending and hepatic flexure of the colon, suspicious for colitis. 3. Cholelithiasis without gallbladder inflammation. 4. Absent renal excretion on delayed phase imaging, often seen with renal dysfunction. Aortic Atherosclerosis (ICD10-I70.0). Electronically Signed   By: Andrea Gasman M.D.   On: 07/07/2024 14:19   DG Chest Port 1 View Result Date: 07/07/2024 CLINICAL DATA:  Possible sepsis. Altered mental status and jaundice. Hypothermia. EXAM: PORTABLE CHEST 1 VIEW COMPARISON:  Head FINDINGS: The heart size and mediastinal contours are within normal limits. No consolidation, effusion, or pneumothorax. Old rib fractures are present bilaterally. IMPRESSION: No active disease. Electronically Signed   By: Leita Birmingham M.D.   On: 07/07/2024 14:15   CT Head Wo Contrast Result Date: 07/07/2024 EXAM: CT HEAD WITHOUT CONTRAST 07/07/2024 12:59:28 PM TECHNIQUE: CT of the head was performed without the administration of intravenous contrast. Automated exposure control, iterative reconstruction, and/or weight based adjustment of the mA/kV was utilized to reduce the radiation dose to as low as reasonably achievable. COMPARISON: 01/06/2016 CLINICAL HISTORY: Mental status change, unknown cause. AMS. FINDINGS: BRAIN AND VENTRICLES:  No acute hemorrhage. No evidence of acute infarct. No hydrocephalus. No extra-axial collection. No mass effect or midline shift. Prominence of the sulci and ventricles compatible with brain atrophy. Hypoattenuating foci in the cerebral white matter, most likely representing chronic small vessel disease. ORBITS: No acute abnormality. SINUSES: No acute abnormality. SOFT TISSUES AND SKULL: No acute soft tissue abnormality. No skull fracture. IMPRESSION: 1. No acute intracranial abnormality. Electronically signed by: Birmingham Calk MD 07/07/2024 01:04 PM EDT RP Workstation: [id number]    ECHO as above  TELEMETRY (personally reviewed): sinus rhythm 1st degree AVB rate 80s  EKG (personally reviewed): no EKG on admission for review  Data reviewed by me 07/12/2024: last 24h vitals tele labs imaging I/O ED provider note, admission H&P, PCCM notes  Principal Problem:   Acute metabolic encephalopathy Active Problems:   Staphylococcus epidermidis bacteremia   Liver disease   Altered mental status   Acute hypoxic respiratory failure (HCC)    ASSESSMENT AND PLAN:  Travious Vanover is a 63 y.o. male  with a past medical history of alcohol use, liver disease who presented to the ED on 07/07/2024 for altered mental status and jaundice. Echo this admission with concern for SBE of anterior leaflet of aortic valve. Cardiology was consulted for further evaluation.   # Possible endocarditis # Acute toxic metabolic encephalopathy # Alcoholic hepatitis # Severe thrombocytopenia # Multiorgan failure Patient initially presented for altered mental status, jaundice.  Treated for alcoholic hepatitis with sepsis, possible colitis.  Severely thrombocytopenic since admission.  Echo done 07/08/2024 with possible vegetation of aortic valve. cMRI with poor image quality, unable to rule out endocarditis. Did note aortic valve prolapse with moderate AR. - After discussion with PCCM team yesterday, given patients progressive  multiorgan failure, increased pressor requirement TEE was cancelled. GOC discussion with patients significant other was held and code status was changed to DNR - Interventions.  Plan for further GOC discussions today with palliative medicine.  - Further management of alcoholic hepatitis, metabolic encephalopathy, anemia and thrombocytopenia as per primary team. -Mildly elevated and flat trending troponins most consistent with demand/supply mismatch and not ACS in the setting of above illness.  Patient is critically ill with multiorgan failure and multiple comorbidities. Prognosis is guarded.    This patient's plan of care was discussed and created with Dr. Custovic and she is in agreement.  Signed: Danita Bloch, PA-C  07/12/2024, 7:30 AM [facility] Cardiology

## 2024-07-12 NOTE — Plan of Care (Signed)
  Problem: Education: Goal: Knowledge of General Education information will improve Description: Including pain rating scale, medication(s)/side effects and non-pharmacologic comfort measures Outcome: Progressing   Problem: Health Behavior/Discharge Planning: Goal: Ability to manage health-related needs will improve Outcome: Progressing   Problem: Clinical Measurements: Goal: Ability to maintain clinical measurements within normal limits will improve Outcome: Progressing Goal: Will remain free from infection Outcome: Progressing Goal: Diagnostic test results will improve Outcome: Progressing Goal: Respiratory complications will improve Outcome: Progressing Goal: Cardiovascular complication will be avoided Outcome: Progressing   Problem: Nutrition: Goal: Adequate nutrition will be maintained Outcome: Progressing   Problem: Coping: Goal: Level of anxiety will decrease Outcome: Progressing   Problem: Elimination: Goal: Will not experience complications related to bowel motility Outcome: Progressing Goal: Will not experience complications related to urinary retention Outcome: Progressing   Problem: Pain Managment: Goal: General experience of comfort will improve and/or be controlled 07/12/2024 1845 by Buren Macario HERO, RN Outcome: Progressing 07/12/2024 1845 by Buren Macario HERO, RN Outcome: Progressing   Problem: Safety: Goal: Ability to remain free from injury will improve 07/12/2024 1845 by Buren Macario HERO, RN Outcome: Progressing 07/12/2024 1845 by Buren Macario HERO, RN Outcome: Progressing   Problem: Skin Integrity: Goal: Risk for impaired skin integrity will decrease Outcome: Progressing   Problem: Activity: Goal: Ability to tolerate increased activity will improve Outcome: Progressing   Problem: Respiratory: Goal: Ability to maintain a clear airway and adequate ventilation will improve Outcome: Progressing   Problem: Role Relationship: Goal: Method of communication  will improve Outcome: Progressing

## 2024-07-12 NOTE — Progress Notes (Addendum)
 Nutrition Follow Up Note   DOCUMENTATION CODES:   Non-severe (moderate) malnutrition in context of chronic illness  INTERVENTION:   Pivot 1.5@50ml /hr- Initiate at 68ml/hr, once tolerating begin increasing by 10ml q 8 hours until goal rate is reached.   Free water flushes 30ml q4 hours to maintain tube patency   Regimen provides 1800kcal/day, 113g/day protein and 1074ml/day of free water.   Continue thiamine 100mg  daily   Vitamin C 500mg  BID via tube  Pt remains at high refeed risk; recommend monitor potassium, magnesium and phosphorus labs daily until stable  Daily weights   NUTRITION DIAGNOSIS:   Moderate Malnutrition related to chronic illness as evidenced by moderate fat depletion, moderate muscle depletion. -new diagnosis   GOAL:   Provide needs based on ASPEN/SCCM guidelines -not met   MONITOR:   Vent status, Labs, Weight trends, TF tolerance, I & O's, Skin  ASSESSMENT:   63 y/o male with h/o etoh and substance abuse and withdrawal seizures who is admitted with acute toxic metabolic encephalopathy, sepsis, shock, AKI, possible colitis, alcoholic hepatitis, bacteremia, pancytopenia and respiratory failure secondary to flash pulmonary edema requiring intubation and mechanical ventilation.  Pt remains sedated and ventilated. OGT in place. Will plan to initiate trickle tube feeds today. Pt is at high refeed risk. Pt's girlfriend at bedside reports that pt is not a big eater at baseline but reports that patient was eating his normal amount prior to admission. Pt is non distended today. Pt is having bowel function. Per chart, pt is down ~5lbs from admission. Pt +13.1L on his I & Os. Girlfriend at bedside reports that she is considering comfort care but would like to give pt 24-48hrs to see if he improves.    Medications reviewed and include: colace, folic acid, solu-cortef, insulin, lactulose, protonix, miralax, thiamine, ceftriaxone, metronidazole, levophed, propofol,  vasopressin   Labs reviewed: K 3.9 wnl, BUN 69(H), creat 2.26(H), P 5.9(H), Mg 2.3 wnl, tbili 12.5(H) BNP- 2360.8(H)- 10/27 Wbc- 27.1(H), Hgb 9.1(L), Hct 27.4(L) Cbgs- 168, 152, 172 x 24 hrs   Patient is currently intubated on ventilator support MV: 9.0 L/min Temp (24hrs), Avg:98.9 F (37.2 C), Min:97.7 F (36.5 C), Max:100.5 F (38.1 C)  Propofol: 5.85 ml/hr- provides 154kcal/day   MAP >50mmHg   UOP-   NUTRITION - FOCUSED PHYSICAL EXAM:  Flowsheet Row Most Recent Value  Orbital Region Moderate depletion  Upper Arm Region Severe depletion  Thoracic and Lumbar Region Moderate depletion  Buccal Region Moderate depletion  Temple Region Moderate depletion  Clavicle Bone Region Moderate depletion  Clavicle and Acromion Bone Region Moderate depletion  Scapular Bone Region Moderate depletion  Dorsal Hand Unable to assess  Patellar Region Moderate depletion  Anterior Thigh Region Moderate depletion  Posterior Calf Region Moderate depletion  Edema (RD Assessment) Moderate  Hair Reviewed  Eyes Reviewed  Mouth Reviewed  Skin Jaundice   Nails Reviewed   Diet Order:   Diet Order             Diet NPO time specified  Diet effective now                  EDUCATION NEEDS:   No education needs have been identified at this time  Skin:  Skin Assessment: Reviewed RN Assessment (ecchymosis, petechiae)  Last BM:  10/30-  Height:   Ht Readings from Last 1 Encounters:  07/10/24 5' 9 (1.753 m)    Weight:   Wt Readings from Last 1 Encounters:  07/12/24 61.8 kg  Ideal Body Weight:  72.7 kg  BMI:  Body mass index is 20.12 kg/m.  Estimated Nutritional Needs:   Kcal:  1776kcal/day  Protein:  100-115g/day  Fluid:  1.8-2.1L/day  Sergio Shams MS, RD, LDN If unable to be reached, please send secure chat to RD inpatient available from 8:00a-4:00p daily

## 2024-07-13 DIAGNOSIS — A419 Sepsis, unspecified organism: Secondary | ICD-10-CM

## 2024-07-13 DIAGNOSIS — D696 Thrombocytopenia, unspecified: Secondary | ICD-10-CM

## 2024-07-13 DIAGNOSIS — G9341 Metabolic encephalopathy: Secondary | ICD-10-CM

## 2024-07-13 LAB — GLUCOSE, CAPILLARY
Glucose-Capillary: 123 mg/dL — ABNORMAL HIGH (ref 70–99)
Glucose-Capillary: 128 mg/dL — ABNORMAL HIGH (ref 70–99)
Glucose-Capillary: 133 mg/dL — ABNORMAL HIGH (ref 70–99)
Glucose-Capillary: 134 mg/dL — ABNORMAL HIGH (ref 70–99)
Glucose-Capillary: 139 mg/dL — ABNORMAL HIGH (ref 70–99)
Glucose-Capillary: 144 mg/dL — ABNORMAL HIGH (ref 70–99)

## 2024-07-13 LAB — MISC LABCORP TEST (SEND OUT): Labcorp test code: 791584

## 2024-07-13 LAB — BASIC METABOLIC PANEL WITH GFR
Anion gap: 18 — ABNORMAL HIGH (ref 5–15)
BUN: 85 mg/dL — ABNORMAL HIGH (ref 8–23)
CO2: 19 mmol/L — ABNORMAL LOW (ref 22–32)
Calcium: 8 mg/dL — ABNORMAL LOW (ref 8.9–10.3)
Chloride: 103 mmol/L (ref 98–111)
Creatinine, Ser: 2.96 mg/dL — ABNORMAL HIGH (ref 0.61–1.24)
GFR, Estimated: 23 mL/min — ABNORMAL LOW (ref >60)
Glucose, Bld: 145 mg/dL — ABNORMAL HIGH (ref 70–99)
Potassium: 4 mmol/L (ref 3.5–5.1)
Sodium: 140 mmol/L (ref 135–145)

## 2024-07-13 LAB — CBC
HCT: 27.7 % — ABNORMAL LOW (ref 39.0–52.0)
Hemoglobin: 9 g/dL — ABNORMAL LOW (ref 13.0–17.0)
MCH: 32.5 pg (ref 26.0–34.0)
MCHC: 32.5 g/dL (ref 30.0–36.0)
MCV: 100 fL (ref 80.0–100.0)
Platelets: 38 K/uL — ABNORMAL LOW (ref 150–400)
RBC: 2.77 MIL/uL — ABNORMAL LOW (ref 4.22–5.81)
RDW: 19.6 % — ABNORMAL HIGH (ref 11.5–15.5)
WBC: 27 K/uL — ABNORMAL HIGH (ref 4.0–10.5)
nRBC: 0.8 % — ABNORMAL HIGH (ref 0.0–0.2)

## 2024-07-13 LAB — BLOOD GAS, ARTERIAL
Acid-base deficit: 1.9 mmol/L (ref 0.0–2.0)
Bicarbonate: 22.2 mmol/L (ref 20.0–28.0)
O2 Content: 10 L/min
O2 Saturation: 93.5 %
Patient temperature: 37
pCO2 arterial: 35 mmHg (ref 32–48)
pH, Arterial: 7.41 (ref 7.35–7.45)
pO2, Arterial: 66 mmHg — ABNORMAL LOW (ref 83–108)

## 2024-07-13 LAB — MAGNESIUM: Magnesium: 2.3 mg/dL (ref 1.7–2.4)

## 2024-07-13 LAB — VANCOMYCIN, RANDOM: Vancomycin Rm: 17 ug/mL

## 2024-07-13 LAB — HEPATIC FUNCTION PANEL
ALT: 20 U/L (ref 0–44)
AST: 71 U/L — ABNORMAL HIGH (ref 15–41)
Albumin: 2 g/dL — ABNORMAL LOW (ref 3.5–5.0)
Alkaline Phosphatase: 108 U/L (ref 38–126)
Bilirubin, Direct: 7.9 mg/dL — ABNORMAL HIGH (ref 0.0–0.2)
Indirect Bilirubin: 5.3 mg/dL — ABNORMAL HIGH (ref 0.3–0.9)
Total Bilirubin: 13.2 mg/dL — ABNORMAL HIGH (ref 0.0–1.2)
Total Protein: 6.7 g/dL (ref 6.5–8.1)

## 2024-07-13 LAB — COOXEMETRY PANEL
Carboxyhemoglobin: 1.5 % (ref 0.5–1.5)
Methemoglobin: <0.7 % (ref 0.0–1.5)
O2 Saturation: 69.5 %
Total hemoglobin: 9.8 g/dL — ABNORMAL LOW (ref 12.0–16.0)
Total oxygen content: 68.3 %

## 2024-07-13 LAB — PHOSPHORUS: Phosphorus: 6.5 mg/dL — ABNORMAL HIGH (ref 2.5–4.6)

## 2024-07-13 MED ORDER — ACETAMINOPHEN 325 MG PO TABS
975.0000 mg | ORAL_TABLET | Freq: Four times a day (QID) | ORAL | Status: DC | PRN
  Administered 2024-07-13: 975 mg
  Filled 2024-07-13 (×2): qty 3

## 2024-07-13 NOTE — Progress Notes (Signed)
 Date of Admission:  07/07/2024      ID: Sergio Curry is a 63 y.o. male  Principal Problem:   Acute metabolic encephalopathy Active Problems:   Staphylococcus epidermidis bacteremia   Liver disease   Altered mental status   Acute hypoxic respiratory failure (HCC)   Malnutrition of moderate degree    Subjective: No change in his condition   Medications:   vitamin C  500 mg Per Tube BID   Chlorhexidine Gluconate Cloth  6 each Topical Daily   docusate  100 mg Per Tube BID   feeding supplement (PIVOT 1.5 CAL)  1,000 mL Per Tube Q24H   folic acid  1 mg Intravenous Daily   free water  30 mL Per Tube Q4H   hydrocortisone sod succinate (SOLU-CORTEF) inj  50 mg Intravenous Q8H   insulin aspart  1-3 Units Subcutaneous Q4H   lactulose  20 g Per NG tube BID   mouth rinse  15 mL Mouth Rinse Q2H   pantoprazole (PROTONIX) IV  40 mg Intravenous Q12H   polyethylene glycol  17 g Per Tube Daily   rifaximin  550 mg Per Tube BID   sodium chloride  flush  10-40 mL Intracatheter Q12H   thiamine (VITAMIN B1) injection  100 mg Intravenous Daily   vancomycin variable dose per unstable renal function (pharmacist dosing)   Does not apply See admin instructions    Objective: Vital signs in last 24 hours: BP (!) 137/46   Pulse (!) 101   Temp 99.7 F (37.6 C)   Resp (!) 21   Ht 5' 9 (1.753 m)   Wt 66.4 kg   SpO2 98%   BMI 21.62 kg/m       PHYSICAL EXAM: Intubated  Sedated No change in his condition next head: Normocephalic, without obvious abnormality, atraumatic. Eyes: Conjunctivae clear, icteric sclerae. Pupils are equal Lungs:b/l air entry Heart: Tachycardia Abdomen: Soft, non-tender,not distended. Bowel sounds normal. No masses Extremities: atraumatic, no cyanosis. No edema. No clubbing Skin: bruising, petechiae Lymph: Cervical, supraclavicular normal. Neurologic: cannot assess  Lab Results    Latest Ref Rng & Units 07/13/2024    4:17 AM 07/12/2024    6:48 PM  07/12/2024   11:53 AM  CBC  WBC 4.0 - 10.5 K/uL 27.0   27.0   Hemoglobin 13.0 - 17.0 g/dL 9.0  9.0  8.9   Hematocrit 39.0 - 52.0 % 27.7  27.2  26.7   Platelets 150 - 400 K/uL 38   36        Latest Ref Rng & Units 07/13/2024    4:17 AM 07/13/2024    4:15 AM 07/12/2024    4:02 AM  CMP  Glucose 70 - 99 mg/dL 854   802   BUN 8 - 23 mg/dL 85   69   Creatinine 9.38 - 1.24 mg/dL 7.03   7.73   Sodium 864 - 145 mmol/L 140   139   Potassium 3.5 - 5.1 mmol/L 4.0   3.9   Chloride 98 - 111 mmol/L 103   104   CO2 22 - 32 mmol/L 19   19   Calcium 8.9 - 10.3 mg/dL 8.0   8.0   Total Protein 6.5 - 8.1 g/dL  6.7  6.8   Total Bilirubin 0.0 - 1.2 mg/dL  86.7  87.4   Alkaline Phos 38 - 126 U/L  108  119   AST 15 - 41 U/L  71  78   ALT 0 -  44 U/L  20  22       Microbiology: 07/07/24 MSSE 07/09/24 BC- NG Studies/Results: DG Chest Port 1 View Result Date: 07/12/2024 EXAM: 1 VIEW(S) XRAY OF THE CHEST 07/12/2024 09:04:00 AM COMPARISON: 2 days ago. CLINICAL HISTORY: [id number] Respiratory failure requiring intubation (HCC) [id number] Respiratory failure requiring intubation (HCC) FINDINGS: LINES, TUBES AND DEVICES: Endotracheal and nasogastric tubes are unchanged. Stable left internal jugular catheter. LUNGS AND PLEURA: Bilateral patchy airspace opacities most consistent with multifocal pneumonia. No pulmonary edema. No pleural effusion. No pneumothorax. HEART AND MEDIASTINUM: No acute abnormality of the cardiac and mediastinal silhouettes. BONES AND SOFT TISSUES: Stable left rib fractures. IMPRESSION: 1. Stable bilateral patchy airspace opacities, consistent with multifocal pneumonia. 2. Stable left rib fractures. Electronically signed by: Lynwood Seip MD 07/12/2024 09:10 AM EDT RP Workstation: [id number]     Assessment/Plan: 63 year old male with history of alcohol abuse presenting with altered mental status   Metabolic encephalopathy Hepatic encephalopathy   Acute hypoxic respiratory failure secondary  to pulmonary edema Patient has been intubated   Bicytopenia with severe anemia and thrombocytopenia  leucocytosis- persist-could be due to hydrocortisone as well  Discussed with intensivist Continue  ceftriaxone and flagyl  MSSE bacteremia 4 out of 4 blood cultures have methicillin-susceptible Staph epidermidis.  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  Call if needed

## 2024-07-13 NOTE — Progress Notes (Signed)
 Palliative Care Progress Note, Assessment & Plan   Patient Name: Sergio Curry       Date: 07/13/2024 DOB: 10/22/1960  Age: 63 y.o. MRN#: [id number] Attending Physician: Malka Domino, MD Primary Care Physician: Patient, No Pcp Per Admit Date: 07/07/2024  Subjective: Patient is lying in bed, intubated and sedated.  His significant other Sergio Curry is at bedside during my visit  HPI: 63 y.o. male  with past medical history of EtOH abuse with alcoholic withdrawal seizures, liver disease, and bilateral hip replacement admitted on 07/07/2024 with AMS and jaundice.   Per chart review, patient presented to ED hypothermic and confirmed he has liver disease while also drinking a few beers a day.   Patient is being treated for mixed distributive and septic shock likely due to sepsis with multiorgan failure, toxic metabolic encephalopathy secondary to alcohol use disorder with acute alcoholic hepatitis and liver failure   Cultures revealed staph epidermidis bacteremia that was possibly a contaminant.  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  Cardiac MRI and repeat blood cultures pending.   Course of hospitalization and significant hospital events are as follows: 10/25: Admitted with acute toxic metabolic encephalopathy, sepsis, possible colitis, alcoholic hepatitis, severe lactic acidosis, macrocytic anemia, and pancytopenia.  Pt received 2 units of pRBC's and 2 units of platelets  10/26: Pt showed signs of ETOH withdrawal overnight requiring ativan  per CIWA protocol once.  CBC stable at this time.  GI consulted and will see pt today  10/27: Pt is on 5L [place]. Remains in withdrawal and not oriented. Platelets 12 this am. Requiring prn Ativan . 10/28: Critically ill  appearing with increasing oxygen requirements, on HHFNC this am and transitioned to Bipap.  Required mechanical intubation due to decline in respiratory status.  Worsening kidney function, as well as elevated BNP requiring lasix 80mg  x 1. Hypotension requiring levophed gtt.  10/29: Pt remains mechanically intubated FiO2 50%.  Remains on dobutamine gtt @2 .5 mcg/kg/min; levophed gtt @20  mcg/min, and vasopressin gtt @0 .03 units.  Now with worsening renal function and hyperbilirubinemia. Pending TEE today . 10/30: Remains mechanically intubated with FiO2 requirements at 40%, peep 5. Remains on levo 12, vaso 0.03 and dobutamine 2.5. Will discontinue dobutamine today given recent Coox of 77. Renal function worsening (Cr 2.26) and Tbili 12.5.    PMT was consulted to support patient and family with goals of care discussions.  Summary of counseling/coordination of care: Extensive chart review completed prior to meeting patient including labs, vital signs, imaging, progress notes, orders, and available advanced directive documents from current and previous encounters.   After reviewing the patient's chart and assessing the patient at bedside, I spoke with patient in regards to symptom management and goals of care.   Discussed next steps and potential changes to plan of care with patient's significant other Sergio Curry.  Space and opportunity provided for Sergio Curry to share thoughts and voiced concerns regarding patient's current plan of care.  She asked several questions about ensuring patient would not suffer, experience agitation end-of-life, and whether or not he would be able to remain in the hospital if one-way compassionate extubation were to occur.    Extended discussion of details of one-way extubation,  aggressive symptom management at end-of-life, and monitoring patient for changes in end-of-life discussed.  Expectations of medical team and of patient's end-of-life transition reviewed.  Patient was appreciative of  our discussion.  I counseled with attending Dr. Malka.  He had plan to meet with patient at 1 PM but decided to meet with patient now.  Attending and I again rounded on patient and discussed options with Sergio Curry at bedside.  Therapeutic silence, emotional support, and active listening provided.  Sergio Curry shares she would like more time to consider.  Discussed potential of patient's friends and Tracy's friends coming to visit and support.   No change to plan of care at this time.  PMT will continue to follow and support.  Physical Exam Vitals reviewed.  Constitutional:      Appearance: He is ill-appearing.  HENT:     Head: Normocephalic.  Cardiovascular:     Rate and Rhythm: Tachycardia present.  Pulmonary:     Comments: Mechanical ventilatory support Abdominal:     Palpations: Abdomen is soft.  Skin:    Coloration: Skin is jaundiced.           50 minute visit includes: Detailed review of medical records (labs, imaging, vital signs), medically appropriate exam (mental status, respiratory, cardiac, skin), discussed with treatment team, counseling and educating patient, family and staff, documenting clinical information, medication management and coordination of care.  Lamarr L. Arvid, DNP, FNP-BC Palliative Medicine Team

## 2024-07-13 NOTE — Consult Note (Signed)
 PHARMACY CONSULT NOTE - ELECTROLYTES  Pharmacy Consult for Electrolyte Monitoring and Replacement   Recent Labs: Height: 5' 9 (175.3 cm) Weight: 66.4 kg (146 lb 6.2 oz) IBW/kg (Calculated) : 70.7 Estimated Creatinine Clearance: 23.7 mL/min (A) (by C-G formula based on SCr of 2.96 mg/dL (H)). Potassium (mmol/L)  Date Value  07/13/2024 4.0   Magnesium (mg/dL)  Date Value  89/68/7974 2.3   Calcium (mg/dL)  Date Value  89/68/7974 8.0 (L)   Albumin (g/dL)  Date Value  89/69/7974 2.1 (L)   Phosphorus (mg/dL)  Date Value  89/68/7974 6.5 (H)   Sodium (mmol/L)  Date Value  07/13/2024 140   Assessment  Sergio Curry is a 63 y.o. male presenting with altered mental status and jaundice. PMH significant for seizures and liver disease. Pharmacy has been consulted to monitor and replace electrolytes.  Diet: NPO - OG tube  Pivot 1.5 cal @ 20 mL/hr  Pertinent medications: n/a  Goal of Therapy: Electrolytes WNL  Plan:  No electrolyte replacement indicated at this time.  Check BMP, Mg, Phos with AM labs  Thank you for allowing pharmacy to be a part of this patient's care.  Bernardino George, PharmD Candidate [phone] [employer] [employer] 07/13/2024 8:09 AM

## 2024-07-13 NOTE — Progress Notes (Addendum)
 [facility] CLINIC CARDIOLOGY PROGRESS NOTE       Patient ID: Sergio Curry MRN: [id number] DOB/AGE: February 26, 1961 63 y.o.  Admit date: 07/07/2024 Referring Physician Lonell Moose, NP Primary Physician Patient, No Pcp Per  Primary Cardiologist None Reason for Consultation Possible SBE  HPI: Sergio Curry is a 63 y.o. male  with a past medical history of alcohol use, liver disease who presented to the ED on 07/07/2024 for altered mental status and jaundice. Echo this admission with concern for SBE of anterior leaflet of aortic valve. Cardiology was consulted for further evaluation.   Interval history: -Patient seen and examined this AM, remains intubated and sedated. -Creatinine continues to uptrend. Palliative following for GOC discussions.  -TEE cancelled due to patients worsening multiorgan failure and multiple pressor requirement. No plan to proceed at this time.   Review of systems complete and found to be negative unless listed above    Past Medical History:  Diagnosis Date   Seizures (HCC)     No past surgical history on file.  No medications prior to admission.   Social History   Socioeconomic History   Marital status: Single    Spouse name: Not on file   Number of children: Not on file   Years of education: Not on file   Highest education level: Not on file  Occupational History   Not on file  Tobacco Use   Smoking status: Former    Types: Cigarettes   Smokeless tobacco: Never  Substance and Sexual Activity   Alcohol use: Yes    Alcohol/week: 7.0 standard drinks of alcohol    Types: 4 Cans of beer, 3 Shots of liquor per week   Drug use: Yes    Types: Marijuana   Sexual activity: Not Currently    Birth control/protection: None  Other Topics Concern   Not on file  Social History Narrative   Not on file   Social Drivers of Health   Financial Resource Strain: Not on file  Food Insecurity: No Food Insecurity (07/07/2024)   Hunger Vital Sign    Worried  About Running Out of Food in the Last Year: Never true    Ran Out of Food in the Last Year: Never true  Transportation Needs: No Transportation Needs (07/07/2024)   PRAPARE - Administrator, Civil Service (Medical): No    Lack of Transportation (Non-Medical): No  Physical Activity: Not on file  Stress: Not on file  Social Connections: Not on file  Intimate Partner Violence: Not At Risk (07/07/2024)   Humiliation, Afraid, Rape, and Kick questionnaire    Fear of Current or Ex-Partner: No    Emotionally Abused: No    Physically Abused: No    Sexually Abused: No    No family history on file.   Vitals:   07/13/24 0630 07/13/24 0645 07/13/24 0700 07/13/24 0715  BP: (!) 139/44 (!) 139/43 (!) 141/42 (!) 141/44  Pulse: 98 98 99 98  Resp: 17 19 17  (!) 24  Temp: 98.6 F (37 C) 98.6 F (37 C) 98.8 F (37.1 C) 98.8 F (37.1 C)  TempSrc:      SpO2: 99% 100% 99% 97%  Weight:      Height:        PHYSICAL EXAM General: Intubated, sedated. HEENT: Normocephalic and atraumatic. Neck: No JVD.  Lungs: Mechanical breath sounds. Heart: HRRR. Normal S1 and S2 without gallops or murmurs.  Abdomen: Non-distended appearing.  Msk: Normal strength and tone for age. Extremities: Warm  and well perfused. No clubbing, cyanosis. Pitting edema b/l feet.   Labs: Basic Metabolic Panel: Recent Labs    07/12/24 0402 07/13/24 0417  NA 139 140  K 3.9 4.0  CL 104 103  CO2 19* 19*  GLUCOSE 197* 145*  BUN 69* 85*  CREATININE 2.26* 2.96*  CALCIUM 8.0* 8.0*  MG 2.3 2.3  PHOS 5.9* 6.5*   Liver Function Tests: Recent Labs    07/11/24 0511 07/12/24 0402  AST 95* 78*  ALT 23 22  ALKPHOS 114 119  BILITOT 12.1* 12.5*  PROT 6.4* 6.8  ALBUMIN 2.2* 2.1*   Recent Labs    07/11/24 0511  LIPASE 17    CBC: Recent Labs    07/11/24 1619 07/12/24 0004 07/12/24 0402 07/12/24 1153 07/12/24 1848 07/13/24 0417  WBC 27.9* 27.7*   < > 27.0*  --  27.0*  NEUTROABS 23.3* 22.6*  --   --    --   --   HGB 6.8* 9.1*   < > 8.9* 9.0* 9.0*  HCT 20.9* 26.9*   < > 26.7* 27.2* 27.7*  MCV 102.5* 97.5   < > 100.4*  --  100.0  PLT 53* 44*   < > 36*  --  38*   < > = values in this interval not displayed.   Cardiac Enzymes: No results for input(s): CKTOTAL, CKMB, CKMBINDEX, TROPONINIHS in the last 72 hours.  BNP: No results for input(s): BNP in the last 72 hours.  D-Dimer: No results for input(s): DDIMER in the last 72 hours.  Hemoglobin A1C: No results for input(s): HGBA1C in the last 72 hours. Fasting Lipid Panel: Recent Labs    07/11/24 0511  TRIG 148   Thyroid Function Tests: No results for input(s): TSH, T4TOTAL, T3FREE, THYROIDAB in the last 72 hours.  Invalid input(s): FREET3 Anemia Panel: No results for input(s): VITAMINB12, FOLATE, FERRITIN, TIBC, IRON, RETICCTPCT in the last 72 hours.    Radiology: [facility] Chest Port 1 View Result Date: 07/12/2024 EXAM: 1 VIEW(S) XRAY OF THE CHEST 07/12/2024 09:04:00 AM COMPARISON: 2 days ago. CLINICAL HISTORY: [id number] Respiratory failure requiring intubation (HCC) [id number] Respiratory failure requiring intubation (HCC) FINDINGS: LINES, TUBES AND DEVICES: Endotracheal and nasogastric tubes are unchanged. Stable left internal jugular catheter. LUNGS AND PLEURA: Bilateral patchy airspace opacities most consistent with multifocal pneumonia. No pulmonary edema. No pleural effusion. No pneumothorax. HEART AND MEDIASTINUM: No acute abnormality of the cardiac and mediastinal silhouettes. BONES AND SOFT TISSUES: Stable left rib fractures. IMPRESSION: 1. Stable bilateral patchy airspace opacities, consistent with multifocal pneumonia. 2. Stable left rib fractures. Electronically signed by: Lynwood Seip MD 07/12/2024 09:10 AM EDT RP Workstation: [id number]   DG Abd 1 View Result Date: 07/10/2024 CLINICAL DATA:  NG placement. EXAM: ABDOMEN - 1 VIEW COMPARISON:  CT abdomen pelvis dated 07/07/2024. FINDINGS: Enteric  tube with tip and side-port in the left upper abdomen the proximal stomach. No bowel dilatation. No acute osseous pathology. IMPRESSION: Enteric tube with tip and side-port in the proximal stomach. Electronically Signed   By: Vanetta Chou M.D.   On: 07/10/2024 14:36   DG Chest Port 1 View Result Date: 07/10/2024 CLINICAL DATA:  Central line placement. EXAM: PORTABLE CHEST 1 VIEW COMPARISON:  Chest radiograph dated 07/10/2024. FINDINGS: Left IJ central venous line with tip over central SVC. Endotracheal tube approximately 5 cm above the carina and enteric tube extends below diaphragm with tip beyond the inferior margin of the image. Bilateral pulmonary opacities as seen on the earlier radiograph.  No pneumothorax. Stable cardiac silhouette. No acute osseous pathology. IMPRESSION: Left IJ central venous line with tip over central SVC. No pneumothorax. Electronically Signed   By: Vanetta Chou M.D.   On: 07/10/2024 14:34   DG Chest Port 1 View Result Date: 07/10/2024 EXAM: 1 VIEW XRAY OF THE CHEST 07/10/2024 11:13:29 AM COMPARISON: Same day. CLINICAL HISTORY: [id number] Encounter for intubation [id number]. Encounter for intubation Encounter for intubation FINDINGS: LINES, TUBES AND DEVICES: Endotracheal tube tip is seen 1 cm above the carina and directed slightly towards the right mainstem bronchus. Withdrawal by 2 to 3 cm is recommended. LUNGS AND PLEURA: Opacities are again noted consistent with pneumonia or possibly edema. No pleural effusion. No pneumothorax. HEART AND MEDIASTINUM: No acute abnormality of the cardiac and mediastinal silhouettes. BONES AND SOFT TISSUES: No acute osseous abnormality. IMPRESSION: 1. Endotracheal tube tip 1 cm above the carina with slight right mainstem orientation; recommend withdrawal by 23 cm. 2. Opacities consistent with pneumonia or edema. Electronically signed by: Lynwood Seip MD 07/10/2024 11:30 AM EDT RP Workstation: [id number]   DG Chest Port 1 View Result Date:  07/10/2024 EXAM: 1 VIEW(S) XRAY OF THE CHEST 07/10/2024 03:26:54 AM COMPARISON: Chest x-ray dated 11/10/2023. CLINICAL HISTORY: [id number] Hypoxia FINDINGS: LUNGS AND PLEURA: Interval increase in bilateral perihilar, right greater than left, streaky consolidative airspace opacity. Left upper lobe patchy airspace opacities. Blunting of the right costophrenic angle with likely underlying trace pleural effusion. Left costophrenic angle is collimated off view. No pulmonary edema. No pneumothorax. HEART AND MEDIASTINUM: No acute abnormality of the cardiac and mediastinal silhouettes. BONES AND SOFT TISSUES: No acute osseous abnormality. IMPRESSION: 1. Interval increase in bilateral perihilar, right greater than left, consolidative and patchy airspace opacities, most consistent with multifocal pneumonia or aspiration pneumonitis. 2. Blunting of the right costophrenic angle with likely trace pleural effusion. Electronically signed by: Morgane Naveau MD 07/10/2024 03:47 AM EDT RP Workstation: [id number]   DG Chest Port 1 View Result Date: 07/09/2024 EXAM: 1 VIEW(S) XRAY OF THE CHEST 07/09/2024 04:45:00 PM COMPARISON: Chest x-ray 07/07/2024. CLINICAL HISTORY: [id number] Dyspnea [id number]. Dyspnea FINDINGS: LUNGS AND PLEURA: There is new bilateral multifocal airspace consolidation, right greater than left. This is predominantly central in location. No pulmonary edema. No pleural effusion. No pneumothorax. HEART AND MEDIASTINUM: No acute abnormality of the cardiac and mediastinal silhouettes. BONES AND SOFT TISSUES: There are multiple healed left-sided rib fractures. IMPRESSION: 1. New bilateral multifocal airspace consolidation, right greater than left, predominantly central in location. Electronically signed by: Greig Pique MD 07/09/2024 09:42 PM EDT RP Workstation: [id number]   MR CARDIAC MORPHOLOGY W WO CONTRAST Result Date: 07/09/2024 CLINICAL DATA:  Aortic regurgitation, possible aortic vegetation EXAM: CARDIAC MRI  TECHNIQUE: The patient was scanned on a 1.5 Tesla Siemens magnet. A dedicated cardiac coil was used. Functional imaging was done using Fiesta sequences. 2,3, and 4 chamber views were done to assess for RWMA's. Modified Simpson's rule using a short axis stack was used to calculate an ejection fraction on a dedicated work [employer]. The patient received 8 cc of Gadavist. After 10 minutes inversion recovery sequences were used to assess for infiltration and scar tissue. Velocity flow mapping performed in the ascending aorta and main pulmonary artery. CONTRAST:  8 cc  of Gadavist FINDINGS: 1. Normal left ventricular size and thickness. Mildly reduced LV systolic function (LVEF = 45%). There is global hypokinesis. There is no late gadolinium enhancement in the left ventricular myocardium. LVEDV: 172 ml LVESV: 94 ml SV: 77 ml CO:  4 L/min Myocardial mass: 90 g 2. Normal right ventricular size, thickness and low normal systolic function. There are no regional wall motion abnormalities. 3.  Normal left and right atrial size. 4. Normal size of the aortic root, ascending aorta and pulmonary artery. 5. Aortic valve prolapse, moderate aortic valve regurgitation. Regurgitant volume 51 ml, regurgitant fraction 53%. 6.  Normal pericardium.  No pericardial effusion. IMPRESSION: 1.  Mildly reduced LV systolic function.  LVEF 45% 2.  No LGE or scar. 3.  Aortic valve prolapse and moderate aortic regurgitation. 4.  Poor image quality, cannot rule out aortic valve vegetation. 5.  Normal RV systolic function. Electronically Signed   By: Redell Cave M.D.   On: 07/09/2024 17:11   MR CARDIAC VELOCITY FLOW MAP Result Date: 07/09/2024 CLINICAL DATA:  Aortic regurgitation, possible aortic vegetation EXAM: CARDIAC MRI TECHNIQUE: The patient was scanned on a 1.5 Tesla Siemens magnet. A dedicated cardiac coil was used. Functional imaging was done using Fiesta sequences. 2,3, and 4 chamber views were done to assess  for RWMA's. Modified Simpson's rule using a short axis stack was used to calculate an ejection fraction on a dedicated work [employer]. The patient received 8 cc of Gadavist. After 10 minutes inversion recovery sequences were used to assess for infiltration and scar tissue. Velocity flow mapping performed in the ascending aorta and main pulmonary artery. CONTRAST:  8 cc  of Gadavist FINDINGS: 1. Normal left ventricular size and thickness. Mildly reduced LV systolic function (LVEF = 45%). There is global hypokinesis. There is no late gadolinium enhancement in the left ventricular myocardium. LVEDV: 172 ml LVESV: 94 ml SV: 77 ml CO: 4 L/min Myocardial mass: 90 g 2. Normal right ventricular size, thickness and low normal systolic function. There are no regional wall motion abnormalities. 3.  Normal left and right atrial size. 4. Normal size of the aortic root, ascending aorta and pulmonary artery. 5. Aortic valve prolapse, moderate aortic valve regurgitation. Regurgitant volume 51 ml, regurgitant fraction 53%. 6.  Normal pericardium.  No pericardial effusion. IMPRESSION: 1.  Mildly reduced LV systolic function.  LVEF 45% 2.  No LGE or scar. 3.  Aortic valve prolapse and moderate aortic regurgitation. 4.  Poor image quality, cannot rule out aortic valve vegetation. 5.  Normal RV systolic function. Electronically Signed   By: Redell Cave M.D.   On: 07/09/2024 17:11   MR CARDIAC VELOCITY FLOW MAP Result Date: 07/09/2024 CLINICAL DATA:  Aortic regurgitation, possible aortic vegetation EXAM: CARDIAC MRI TECHNIQUE: The patient was scanned on a 1.5 Tesla Siemens magnet. A dedicated cardiac coil was used. Functional imaging was done using Fiesta sequences. 2,3, and 4 chamber views were done to assess for RWMA's. Modified Simpson's rule using a short axis stack was used to calculate an ejection fraction on a dedicated work [employer]. The patient received 8 cc of Gadavist. After  10 minutes inversion recovery sequences were used to assess for infiltration and scar tissue. Velocity flow mapping performed in the ascending aorta and main pulmonary artery. CONTRAST:  8 cc  of Gadavist FINDINGS: 1. Normal left ventricular size and thickness. Mildly reduced LV systolic function (LVEF = 45%). There is global hypokinesis. There is no late gadolinium enhancement in the left ventricular myocardium. LVEDV: 172 ml LVESV: 94 ml SV: 77 ml CO: 4 L/min Myocardial mass: 90 g 2. Normal right ventricular size, thickness and low normal systolic function. There are no regional wall motion abnormalities. 3.  Normal left  and right atrial size. 4. Normal size of the aortic root, ascending aorta and pulmonary artery. 5. Aortic valve prolapse, moderate aortic valve regurgitation. Regurgitant volume 51 ml, regurgitant fraction 53%. 6.  Normal pericardium.  No pericardial effusion. IMPRESSION: 1.  Mildly reduced LV systolic function.  LVEF 45% 2.  No LGE or scar. 3.  Aortic valve prolapse and moderate aortic regurgitation. 4.  Poor image quality, cannot rule out aortic valve vegetation. 5.  Normal RV systolic function. Electronically Signed   By: Redell Cave M.D.   On: 07/09/2024 17:11   ECHOCARDIOGRAM COMPLETE Result Date: 07/08/2024    ECHOCARDIOGRAM REPORT   Patient Name:   Sergio Curry Date of Exam: 07/08/2024 Medical Rec #:  [id number]        Height:       69.0 in Accession #:    [id number]       Weight:       129.4 lb Date of Birth:  March 09, 1961         BSA:          1.717 m Patient Age:    63 years         BP:           133/32 mmHg Patient Gender: M                HR:           84 bpm. Exam Location:  [facility] Procedure: 2D Echo, Cardiac Doppler and Color Doppler (Both Spectral and Color            Flow Doppler were utilized during procedure). Indications:     Elevated Troponin  History:         Patient has no prior history of Echocardiogram examinations.  Sonographer:     Thedora Louder RDCS, FASE  Referring Phys:  [id number] LONELL KANDICE MOOSE Diagnosing Phys: Cara JONETTA Lovelace MD  Sonographer Comments: Technically difficult study due to poor echo windows. Image acquisition challenging due to patient behavioral factors. IMPRESSIONS  1. Possible SBE of anterior leaflet. Recommend TEE.  2. Technically difficult study.  3. Left ventricular ejection fraction, by estimation, is 45 to 50%. The left ventricle has mildly decreased function. The left ventricle demonstrates global hypokinesis. The left ventricular internal cavity size was moderately to severely dilated. Left ventricular diastolic function could not be evaluated.  4. Right ventricular systolic function is normal. The right ventricular size is normal.  5. Left atrial size was mildly dilated.  6. The mitral valve is myxomatous. Trivial mitral valve regurgitation.  7. 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 SBE can not be excluded. Consider TEE.. The aortic valve is calcified. Aortic valve regurgitation is mild to moderate. Aortic valve sclerosis/calcification is present, without any evidence of aortic stenosis. Conclusion(s)/Recommendation(s): Poor windows for evaluation of left ventricular function by transthoracic echocardiography. Would recommend an alternative means of evaluation. Findings concerning for aortic valve vegetation, would recommend a Transesophageal Echocardiogram for clarification. FINDINGS  Left Ventricle: Left ventricular ejection fraction, by estimation, is 45 to 50%. The left ventricle has mildly decreased function. The left ventricle demonstrates global hypokinesis. Strain was performed and the global longitudinal strain is indeterminate. The left ventricular internal cavity size was moderately to severely dilated. There is no left ventricular hypertrophy. Left ventricular diastolic function could not be evaluated. Right Ventricle: The right ventricular size is normal. No increase in right ventricular wall  thickness. Right ventricular systolic function is normal. Left Atrium: Left  atrial size was mildly dilated. Right Atrium: Right atrial size was normal in size. Pericardium: There is no evidence of pericardial effusion. Mitral Valve: The mitral valve is myxomatous. Trivial mitral valve regurgitation. Tricuspid Valve: The tricuspid valve is normal in structure. Tricuspid valve regurgitation is trivial. Aortic Valve: Mobile mass on anterior leaflet prolapsing inyo the outflow tract. SBE can not be excluded. Consider TEE. The aortic valve is calcified. Aortic valve regurgitation is mild to moderate. Aortic valve sclerosis/calcification is present, without any evidence of aortic stenosis. Aortic valve peak gradient measures 8.2 mmHg. Pulmonic Valve: The pulmonic valve was normal in structure. Pulmonic valve regurgitation is not visualized. Aorta: The ascending aorta was not well visualized. IAS/Shunts: No atrial level shunt detected by color flow Doppler. Additional Comments: Possible SBE of anterior leaflet. Recommend TEE. Technically difficult study. 3D was performed not requiring image post processing on an independent workstation and was indeterminate.  LEFT VENTRICLE PLAX 2D LVIDd:         6.10 cm LVIDs:         4.70 cm LV PW:         1.10 cm LV IVS:        1.00 cm  LV Volumes (MOD) LV vol d, MOD A4C: 90.4 ml LV vol s, MOD A4C: 29.6 ml LV SV MOD A4C:     90.4 ml LEFT ATRIUM           Index LA diam:      4.10 cm 2.39 cm/m LA Vol (A4C): 26.0 ml 15.14 ml/m  AORTIC VALVE              PULMONIC VALVE AV Vmax:      143.00 cm/s RVOT Peak grad: 1 mmHg AV Peak Grad: 8.2 mmHg LVOT Vmax:    91.80 cm/s LVOT Vmean:   62.000 cm/s LVOT VTI:     0.196 m  AORTA Ao Asc diam: 3.10 cm  SHUNTS Systemic VTI: 0.20 m Cara JONETTA Lovelace MD Electronically signed by Cara JONETTA Lovelace MD Signature Date/Time: 07/08/2024/11:40:39 AM    Final    US  ABDOMEN LIMITED WITH LIVER DOPPLER Result Date: 07/08/2024 CLINICAL DATA:  63 year old male  with history of hyperbilirubinemia. EXAM: DUPLEX ULTRASOUND OF LIVER TECHNIQUE: Color and duplex Doppler ultrasound was performed to evaluate the hepatic in-flow and out-flow vessels. COMPARISON:  None Available. FINDINGS: Liver: Diffusely coarsened echotexture with increased echogenicity normal hepatic contour without nodularity. No focal lesion, mass or intrahepatic biliary ductal dilatation. Main Portal Vein size: 0.83 cm Portal Vein Velocities Main Prox:  23 cm/sec, antegrade Main Dist:  23 cm/sec, antegrade Right: Not visualized. Left: Not visualized. Hepatic Vein Velocities Right: Not visualized. Middle:  14.5 cm/sec Left: Not visualized. IVC: Present and patent with normal respiratory phasicity. Hepatic Artery Velocity:  216 cm/sec Splenic Vein Velocity: Not visualized. Spleen: 8.8 cm x 8.1 cm x 3.6 cm with a total volume of 134 cm^3 (411 cm^3 is upper limit normal) Portal Vein Occlusion/Thrombus: No Splenic Vein Occlusion/Thrombus: No Ascites: None Varices: None IMPRESSION: 1. Limited evaluation. The visualized portal vasculature is patent with antegrade flow. 2. Diffusely coarsened and echogenic hepatic parenchyma as could be seen with hepatic steatosis or acute hepatitis. 3. No evidence of biliary ductal dilation. Ester Sides, MD [facility] Electronically Signed   By: Ester Sides M.D.   On: 07/08/2024 06:30   CT ABDOMEN PELVIS W CONTRAST Result Date: 07/07/2024 CLINICAL DATA:  Acute abdominal pain. Altered mental status and jaundice. EXAM:  CT ABDOMEN AND PELVIS WITH CONTRAST TECHNIQUE: Multidetector CT imaging of the abdomen and pelvis was performed using the standard protocol following bolus administration of intravenous contrast. RADIATION DOSE REDUCTION: This exam was performed according to the departmental dose-optimization program which includes automated exposure control, adjustment of the mA and/or kV according to patient size and/or  use of iterative reconstruction technique. CONTRAST:  75mL OMNIPAQUE IOHEXOL 300 MG/ML  SOLN COMPARISON:  None Available. FINDINGS: Lower chest: Bandlike areas of atelectasis within both lower lobes. Trace pleural thickening without significant effusion. Hepatobiliary: Enlarged liver spanning 18.9 cm cranial caudal. Advanced hepatic steatosis. Allowing for motion artifact, no focal liver abnormality. There may be subtle capsular nodularity, although motion obscures assessment. Small gallstone within minimally distended gallbladder. No biliary dilatation. Pancreas: Parenchymal atrophy. No ductal dilatation or inflammation. No evidence of pancreatic mass. Spleen: No splenomegaly.  No focal splenic abnormality. Adrenals/Urinary Tract: No adrenal nodule. No hydronephrosis. Symmetric bilateral perinephric stranding. No renal calculi or suspicious renal abnormality. Absent excretion on delayed phase imaging. Partially distended urinary bladder, mildly thick walled. Stomach/Bowel: Small hiatal hernia. Decompressed stomach. Small bowel is decompressed. Normal appendix is tentatively visualized, regardless no appendicitis. Mild wall thickening about the ascending and hepatic flexure of the colon. Moderate stool within the left colon. No bowel obstruction. Vascular/Lymphatic: Aortic atherosclerosis. No aneurysm. The portal vein is patent allowing for motion. No abdominopelvic adenopathy. Reproductive: Prostate is unremarkable. Other: Trace free fluid in the pelvis, but no significant ascites. No free air. No abdominal wall hernia. Musculoskeletal: Remote left rib fractures. Chronic hip arthropathy and postsurgical change. No acute osseous findings. IMPRESSION: 1. Hepatomegaly and advanced hepatic steatosis. There may be subtle capsular nodularity, although motion obscures assessment. Recommend correlation with any clinical or laboratory findings of cirrhosis. 2. Mild wall thickening about the ascending and hepatic flexure of  the colon, suspicious for colitis. 3. Cholelithiasis without gallbladder inflammation. 4. Absent renal excretion on delayed phase imaging, often seen with renal dysfunction. Aortic Atherosclerosis (ICD10-I70.0). Electronically Signed   By: Andrea Gasman M.D.   On: 07/07/2024 14:19   DG Chest Port 1 View Result Date: 07/07/2024 CLINICAL DATA:  Possible sepsis. Altered mental status and jaundice. Hypothermia. EXAM: PORTABLE CHEST 1 VIEW COMPARISON:  Head FINDINGS: The heart size and mediastinal contours are within normal limits. No consolidation, effusion, or pneumothorax. Old rib fractures are present bilaterally. IMPRESSION: No active disease. Electronically Signed   By: Leita Birmingham M.D.   On: 07/07/2024 14:15   CT Head Wo Contrast Result Date: 07/07/2024 EXAM: CT HEAD WITHOUT CONTRAST 07/07/2024 12:59:28 PM TECHNIQUE: CT of the head was performed without the administration of intravenous contrast. Automated exposure control, iterative reconstruction, and/or weight based adjustment of the mA/kV was utilized to reduce the radiation dose to as low as reasonably achievable. COMPARISON: 01/06/2016 CLINICAL HISTORY: Mental status change, unknown cause. AMS. FINDINGS: BRAIN AND VENTRICLES: No acute hemorrhage. No evidence of acute infarct. No hydrocephalus. No extra-axial collection. No mass effect or midline shift. Prominence of the sulci and ventricles compatible with brain atrophy. Hypoattenuating foci in the cerebral white matter, most likely representing chronic small vessel disease. ORBITS: No acute abnormality. SINUSES: No acute abnormality. SOFT TISSUES AND SKULL: No acute soft tissue abnormality. No skull fracture. IMPRESSION: 1. No acute intracranial abnormality. Electronically signed by: Birmingham Calk MD 07/07/2024 01:04 PM EDT RP Workstation: [id number]    ECHO as above  TELEMETRY (personally reviewed): sinus rhythm 1st degree AVB rate 90s  EKG (personally reviewed): no EKG on admission for  review  Data reviewed by me 07/13/2024: last 24h vitals tele labs imaging I/O ED provider note, admission H&P, PCCM notes  Principal Problem:   Acute metabolic encephalopathy Active Problems:   Staphylococcus epidermidis bacteremia   Liver disease   Altered mental status   Acute hypoxic respiratory failure (HCC)   Malnutrition of moderate degree    ASSESSMENT AND PLAN:  Sergio Curry is a 63 y.o. male  with a past medical history of alcohol use, liver disease who presented to the ED on 07/07/2024 for altered mental status and jaundice. Echo this admission with concern for SBE of anterior leaflet of aortic valve. Cardiology was consulted for further evaluation.   # Possible endocarditis # Acute toxic metabolic encephalopathy # Alcoholic hepatitis # Severe thrombocytopenia # Multiorgan failure Patient initially presented for altered mental status, jaundice.  Treated for alcoholic hepatitis with sepsis, possible colitis.  Severely thrombocytopenic since admission.  Echo done 07/08/2024 with possible vegetation of aortic valve. cMRI with poor image quality, unable to rule out endocarditis. Did note aortic valve prolapse with moderate AR. - After discussion with PCCM team 07/11/24, given patients progressive multiorgan failure, increased pressor requirement TEE was cancelled. GOC discussion with patients significant other was held and code status was changed to DNR - Interventions. Plan for further GOC discussions today with palliative medicine, considering comfort care.  - Further management of alcoholic hepatitis, metabolic encephalopathy, anemia and thrombocytopenia as per primary team. - Mildly elevated and flat trending troponins most consistent with demand/supply mismatch and not ACS in the setting of above illness.  Patient is critically ill with multiorgan failure and multiple comorbidities. Prognosis is guarded.    This patient's plan of care was discussed and created with Dr.  Ammon and he is in agreement.  Signed: Danita Bloch, PA-C  07/13/2024, 7:59 AM [facility] Cardiology

## 2024-07-13 NOTE — Progress Notes (Signed)
 NAME:  Sergio Curry, MRN:  [id number], DOB:  07/01/1961, LOS: 6 ADMISSION DATE:  07/07/2024 History of Present Illness:  This is a 63 yo male who presented to [facility] ER on 10/25 via EMS with altered mental status and jaundice.  Per ED notes EMS reported when they arrived on the scene pt hypothermic temp 93.9 F, therefore they administered 500 ml NS bolus.     ED Course  Upon arrival to the ER pt reported he does have liver disease and drinks a few beers a day. Initial vital signs were: temp 94.1 F/sbp 97/hr 96/rr 20/O2 sats 94% on RA.  Significant lab results were: Na+132/K+ 2.9/chloride 96/CO2 18/BUN 37/creatinine 1.68/calcium 8.4/anion 8.4/alk phos 163/albumin 1.8/AST 128/ammonia 46/total bilirubin 8.3/troponin 171/lactic >9.0/wbc 15.3/hgb 6.6/platelet count 6/PT 20.7/INR 1.7/alcohol level <15.  Pt denies bloody stools, melena, or vomiting blood.  He denies taking blood thinners.  CT Head negative.  While in the ER pt received 2.5L LR bolus and flagyl/vancomycin ordered.  CT Abd/Pelvis revealed hepatomegaly and advanced hepatic steatosis, possible colitis, and cholelithiasis without gall bladder inflammation.  PCCM team contacted for ICU admission.    Pertinent  Medical History  Alcoholic Withdrawal Seizures   Significant Hospital Events: Including procedures, antibiotic start and stop dates in addition to other pertinent events   10/25: Admitted with acute toxic metabolic encephalopathy, sepsis, possible colitis, alcoholic hepatitis, severe lactic acidosis, macrocytic anemia, and pancytopenia.  Pt received 2 units of pRBC's and 2 units of platelets  10/26: Pt showed signs of ETOH withdrawal overnight requiring ativan  per CIWA protocol once.  CBC stable at this time.  GI consulted and will see pt today  10/27: Pt is on 5L [place]. Remains in withdrawal and not oriented. Platelets 12 this am. Requiring prn Ativan . 10/28: Critically ill appearing with increasing oxygen requirements, on HHFNC this am  and transitioned to Bipap.  Required mechanical intubation due to decline in respiratory status.  Worsening kidney function, as well as elevated BNP requiring lasix 80mg  x 1. Hypotension requiring levophed gtt.  10/29: Pt remains mechanically intubated FiO2 50%.  Remains on dobutamine gtt @2 .5 mcg/kg/min; levophed gtt @20  mcg/min, and vasopressin gtt @0 .03 units.  Now with worsening renal function and hyperbilirubinemia. Pending TEE today  10/30: Remains mechanically intubated with FiO2 requirements at 40%, peep 5. Remains on levo 12, vaso 0.03 and dobutamine 2.5. Will discontinue dobutamine today given recent Coox of 77. Renal function worsening (Cr 2.26) and Tbili 12.5.     Interim History / Subjective:  --Patient remains intubated, non responsive.  -- Remains on Levo 15, Vaso 0.04 and Dobutamine 5 - CvSO2 69%  -- Kidney Function keeps worsening unfortunately with Cr. 2.96 now.  -- Ongoing GOC discussion with the family.   Objective    Blood pressure (!) 141/44, pulse 98, temperature 98.8 F (37.1 C), resp. rate (!) 24, height 5' 9 (1.753 m), weight 66.4 kg, SpO2 97%. CVP:  [13 mmHg] 13 mmHg  Vent Mode: PRVC FiO2 (%):  [40 %] 40 % Set Rate:  [20 bmp] 20 bmp Vt Set:  [450 mL] 450 mL PEEP:  [5 cmH20] 5 cmH20   Intake/Output Summary (Last 24 hours) at 07/13/2024 0820 Last data filed at 07/13/2024 0600 Gross per 24 hour  Intake 1038.49 ml  Output 617 ml  Net 421.49 ml   Filed Weights   07/11/24 0500 07/12/24 0419 07/13/24 0410  Weight: 65 kg 61.8 kg 66.4 kg    Examination: General: Intubated Sedated HENT: Supple neck, reactive  pupils  Lungs: Coarse breath sounds bilaterally Cardiovascular: Normal S1, Normal S2, RRR  Abdomen: Soft, non tender, mildly disteded Extremities: Cold, clammy   Labs and imaging were reviewed  Assessment and Plan  #Mixed distributive and septic shock likely in the setting of sepsis with multiorgan failure.  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 # Thrombocytopenia, anemia could be in the setting of sepsis. # AKI with creatinine 1.77 mg/dL from a baseline of 0.4 mg/dL in 7979 last reading. DDX including HRS vs septic ATN.  #Flash pulmonary edema.    Neuro: CIWA protocol. High-dose thiamine for 3 days.  Multivitamin and folic acid when able.  Lactulose 2 times daily for 3 bowel movements a day and cw rifaximin.  CVS: NE - Vaso to Maintain MAP greater than 65. DC Dobutamine for inotropic support.  Lungs LPV 6-8 cc/kg ibw. SpO2 > 90%. MV for PH > 7.2.   GI: Trickle feeds.  C/w Stress dose steroids.  ID: Continue with Zsyn and add vanco for antibiotic coverage.  Repeat blood cultures negative. Holding off TEE given worsening shock status.  Renal: Monitor UOP.  Lasix 80mg  x1  GOC: Overall prognosis is extremely poor. Discussed with his Girlfriend at bedside and reportedly he did want her to be her health care proxy but no official documentation. Per [place] law she can be his next of kin if felt she was doing what is right for the patient and it is the case here. Currently DNR.   Will meet again with his gf today and discuss overall trajectory and goals of care and need for dialysis if comfort was not pursued.   Critical care time: 45 minutes.      Darrin Barn, MD [facility] 07/13/2024 8:29 AM

## 2024-07-14 DIAGNOSIS — G9341 Metabolic encephalopathy: Secondary | ICD-10-CM

## 2024-07-14 DIAGNOSIS — J9601 Acute respiratory failure with hypoxia: Secondary | ICD-10-CM

## 2024-07-14 LAB — GLUCOSE, CAPILLARY
Glucose-Capillary: 129 mg/dL — ABNORMAL HIGH (ref 70–99)
Glucose-Capillary: 125 mg/dL — ABNORMAL HIGH (ref 70–99)

## 2024-07-14 LAB — CBC
HCT: 27.7 % — ABNORMAL LOW (ref 39.0–52.0)
Hemoglobin: 8.8 g/dL — ABNORMAL LOW (ref 13.0–17.0)
MCH: 32.6 pg (ref 26.0–34.0)
MCHC: 31.8 g/dL (ref 30.0–36.0)
MCV: 102.6 fL — ABNORMAL HIGH (ref 80.0–100.0)
Platelets: 38 K/uL — ABNORMAL LOW (ref 150–400)
RBC: 2.7 MIL/uL — ABNORMAL LOW (ref 4.22–5.81)
RDW: 19.9 % — ABNORMAL HIGH (ref 11.5–15.5)
WBC: 23.9 K/uL — ABNORMAL HIGH (ref 4.0–10.5)
nRBC: 1.7 % — ABNORMAL HIGH (ref 0.0–0.2)

## 2024-07-14 LAB — BASIC METABOLIC PANEL WITH GFR
Anion gap: 17 — ABNORMAL HIGH (ref 5–15)
BUN: 101 mg/dL — ABNORMAL HIGH (ref 8–23)
CO2: 16 mmol/L — ABNORMAL LOW (ref 22–32)
Calcium: 7.6 mg/dL — ABNORMAL LOW (ref 8.9–10.3)
Chloride: 103 mmol/L (ref 98–111)
Creatinine, Ser: 3.7 mg/dL — ABNORMAL HIGH (ref 0.61–1.24)
GFR, Estimated: 17 mL/min — ABNORMAL LOW (ref >60)
Glucose, Bld: 137 mg/dL — ABNORMAL HIGH (ref 70–99)
Potassium: 3.9 mmol/L (ref 3.5–5.1)
Sodium: 136 mmol/L (ref 135–145)

## 2024-07-14 LAB — TRIGLYCERIDES: Triglycerides: 130 mg/dL (ref <150)

## 2024-07-14 LAB — HEPATIC FUNCTION PANEL
ALT: 14 U/L (ref 0–44)
AST: 68 U/L — ABNORMAL HIGH (ref 15–41)
Albumin: 1.9 g/dL — ABNORMAL LOW (ref 3.5–5.0)
Alkaline Phosphatase: 96 U/L (ref 38–126)
Bilirubin, Direct: 8.8 mg/dL — ABNORMAL HIGH (ref 0.0–0.2)
Indirect Bilirubin: 6.3 mg/dL — ABNORMAL HIGH (ref 0.3–0.9)
Total Bilirubin: 15.1 mg/dL — ABNORMAL HIGH (ref 0.0–1.2)
Total Protein: 6.6 g/dL (ref 6.5–8.1)

## 2024-07-14 LAB — CULTURE, BLOOD (ROUTINE X 2)
Culture: NO GROWTH
Culture: NO GROWTH
Special Requests: ADEQUATE
Special Requests: ADEQUATE

## 2024-07-14 LAB — PHOSPHORUS: Phosphorus: 8 mg/dL — ABNORMAL HIGH (ref 2.5–4.6)

## 2024-07-14 LAB — VANCOMYCIN, RANDOM: Vancomycin Rm: 15 ug/mL

## 2024-07-14 LAB — MAGNESIUM: Magnesium: 2.5 mg/dL — ABNORMAL HIGH (ref 1.7–2.4)

## 2024-07-14 LAB — COOXEMETRY PANEL
Carboxyhemoglobin: 1.2 % (ref 0.5–1.5)
Methemoglobin: <0.7 % (ref 0.0–1.5)
O2 Saturation: 64.9 %
Total hemoglobin: 10.6 g/dL — ABNORMAL LOW (ref 12.0–16.0)
Total oxygen content: 64 %

## 2024-07-14 MED ORDER — GLYCOPYRROLATE 0.2 MG/ML IJ SOLN
0.2000 mg | INTRAMUSCULAR | Status: DC | PRN
  Administered 2024-07-14: 0.2 mg via INTRAVENOUS
  Filled 2024-07-14: qty 1

## 2024-07-14 MED ORDER — ACETAMINOPHEN 325 MG PO TABS: 650.0000 mg | ORAL_TABLET | Freq: Four times a day (QID) | ORAL | Status: DC | PRN

## 2024-07-14 MED ORDER — SODIUM CHLORIDE 0.9 % IV SOLN: INTRAVENOUS | Status: DC

## 2024-07-14 MED ORDER — MIDAZOLAM HCL (PF) 2 MG/2ML IJ SOLN: 2.0000 mg | INTRAMUSCULAR | Status: DC | PRN

## 2024-07-14 MED ORDER — GLYCOPYRROLATE 1 MG PO TABS: 1.0000 mg | ORAL_TABLET | ORAL | Status: DC | PRN

## 2024-07-14 MED ORDER — ACETAMINOPHEN 650 MG RE SUPP: 650.0000 mg | Freq: Four times a day (QID) | RECTAL | Status: DC | PRN

## 2024-07-14 MED ORDER — GLYCOPYRROLATE 0.2 MG/ML IJ SOLN: 0.2000 mg | INTRAMUSCULAR | Status: DC | PRN

## 2024-07-14 MED ORDER — POLYVINYL ALCOHOL 1.4 % OP SOLN: 1.0000 [drp] | Freq: Four times a day (QID) | OPHTHALMIC | Status: DC | PRN

## 2024-07-14 NOTE — Progress Notes (Signed)
 NAME:  Sergio Curry, MRN:  [id number], DOB:  Mar 20, 1961, LOS: 7 ADMISSION DATE:  07/07/2024 History of Present Illness:  This is a 63 yo male who presented to [facility] ER on 10/25 via EMS with altered mental status and jaundice.  Per ED notes EMS reported when they arrived on the scene pt hypothermic temp 93.9 F, therefore they administered 500 ml NS bolus.     ED Course  Upon arrival to the ER pt reported he does have liver disease and drinks a few beers a day. Initial vital signs were: temp 94.1 F/sbp 97/hr 96/rr 20/O2 sats 94% on RA.  Significant lab results were: Na+132/K+ 2.9/chloride 96/CO2 18/BUN 37/creatinine 1.68/calcium 8.4/anion 8.4/alk phos 163/albumin 1.8/AST 128/ammonia 46/total bilirubin 8.3/troponin 171/lactic >9.0/wbc 15.3/hgb 6.6/platelet count 6/PT 20.7/INR 1.7/alcohol level <15.  Pt denies bloody stools, melena, or vomiting blood.  He denies taking blood thinners.  CT Head negative.  While in the ER pt received 2.5L LR bolus and flagyl/vancomycin ordered.  CT Abd/Pelvis revealed hepatomegaly and advanced hepatic steatosis, possible colitis, and cholelithiasis without gall bladder inflammation.  PCCM team contacted for ICU admission.    Pertinent  Medical History  Alcoholic Withdrawal Seizures   Significant Hospital Events: Including procedures, antibiotic start and stop dates in addition to other pertinent events   10/25: Admitted with acute toxic metabolic encephalopathy, sepsis, possible colitis, alcoholic hepatitis, severe lactic acidosis, macrocytic anemia, and pancytopenia.  Pt received 2 units of pRBC's and 2 units of platelets  10/26: Pt showed signs of ETOH withdrawal overnight requiring ativan  per CIWA protocol once.  CBC stable at this time.  GI consulted and will see pt today  10/27: Pt is on 5L [place]. Remains in withdrawal and not oriented. Platelets 12 this am. Requiring prn Ativan . 10/28: Critically ill appearing with increasing oxygen requirements, on HHFNC this am  and transitioned to Bipap.  Required mechanical intubation due to decline in respiratory status.  Worsening kidney function, as well as elevated BNP requiring lasix 80mg  x 1. Hypotension requiring levophed gtt.  10/29: Pt remains mechanically intubated FiO2 50%.  Remains on dobutamine gtt @2 .5 mcg/kg/min; levophed gtt @20  mcg/min, and vasopressin gtt @0 .03 units.  Now with worsening renal function and hyperbilirubinemia. Pending TEE today  10/30: Remains mechanically intubated with FiO2 requirements at 40%, peep 5. Remains on levo 12, vaso 0.03 and dobutamine 2.5. Will discontinue dobutamine today given recent Coox of 77. Renal function worsening (Cr 2.26) and Tbili 12.5.   11/1: Comfort measures only.,   Interim History / Subjective:  --Patient remains intubated, non responsive.  -- Overall worsening multiorgan failure and pressors requirements.  -- Discussed with his ELINORE Sora at bedside and decided to proceed with comfort measures only.  Objective    Blood pressure (!) 142/41, pulse 98, temperature 99.7 F (37.6 C), resp. rate 15, height 5' 9 (1.753 m), weight 64.3 kg, SpO2 93%.    Vent Mode: PRVC FiO2 (%):  [30 %-40 %] 30 % Set Rate:  [20 bmp] 20 bmp Vt Set:  [450 mL] 450 mL PEEP:  [5 cmH20] 5 cmH20 Plateau Pressure:  [14 cmH20] 14 cmH20   Intake/Output Summary (Last 24 hours) at 07/14/2024 1115 Last data filed at 07/14/2024 0600 Gross per 24 hour  Intake 1021.63 ml  Output 1191 ml  Net -169.37 ml   Filed Weights   07/12/24 0419 07/13/24 0410 07/14/24 0500  Weight: 61.8 kg 66.4 kg 64.3 kg    Examination: General: Intubated Sedated HENT: Supple neck, reactive pupils  Lungs: Coarse breath sounds bilaterally Cardiovascular: Normal S1, Normal S2, RRR  Abdomen: Soft, non tender, mildly disteded Extremities: Cold, clammy   Labs and imaging were reviewed  Assessment and Plan  #Mixed distributive and septic shock likely in the setting of sepsis with multiorgan failure.  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 # Thrombocytopenia, anemia could be in the setting of sepsis. # AKI with creatinine 1.77 mg/dL from a baseline of 0.4 mg/dL in 7979 last reading. DDX including HRS vs septic ATN.  #Flash pulmonary edema.   GOC: Overall prognosis is extremely poor. Discussed with his Girlfriend at bedside and decided to proceed with Comfort measures only. Orders updated.   Critical care time: 40 minutes.     Darrin Barn, MD [facility] 07/14/2024 11:15 AM

## 2024-07-14 NOTE — Progress Notes (Signed)
 Patient extubated to comfort measures per provider order with no complications.

## 2024-07-14 NOTE — IPAL (Signed)
  Interdisciplinary Goals of Care Family Meeting   Date carried out: 07/14/2024  Location of the meeting: Bedside  Member's involved: Physician and Family Member or next of kin  Durable Power of Attorney or environmental health practitioner: Girlfriend    Discussion: We discussed goals of care for Sergio Curry . Discussed with Sergio Curry the patients course and trajectory of worsening renal failure and liver failure. Also worsening shock status. She clearly relayed that Sergio Curry would have not want this. Therefore we have agreed to proceed with comfort measures only.   Code status:   Code Status: Do not attempt resuscitation (DNR) PRE-ARREST INTERVENTIONS DESIRED   Disposition: In-patient comfort care  Time spent for the meeting: 15 min   Darrin Barn, MD  07/14/2024, 9:53 AM

## 2024-07-14 NOTE — Plan of Care (Signed)
 Rounded in ICU and spoke with Dr. Malka who recently met with Tracy-significant other at bedside and decision has been made to transition to CMO/compassionate extubation. Orders have been placed by CCM. PMT will follow peripherally for ongoing needs and support.  No Charge.  Waddell Lesches, DNP, AGNP-C Palliative Medicine  Please call Palliative Medicine team phone with any questions [phone]. For individual providers please see AMION.

## 2024-07-14 NOTE — Plan of Care (Signed)
  Problem: Clinical Measurements: Goal: Quality of life will improve Outcome: Progressing   Problem: Role Relationship: Goal: Family's ability to cope with current situation will improve Outcome: Progressing   Problem: Pain Management: Goal: Satisfaction with pain management regimen will improve Outcome: Progressing   

## 2024-07-14 NOTE — Progress Notes (Signed)
 SUBJECTIVE: Patient remains intubated with his life partner at bedside.   Vitals:   07/14/24 0753 07/14/24 0754 07/14/24 0800 07/14/24 0900  BP: (!) 143/41  (!) 142/43 (!) 142/41  Pulse:  97 97 98  Resp:  20 16 15   Temp: 98.5 F (36.9 C) 99.3 F (37.4 C) 99.5 F (37.5 C) 99.7 F (37.6 C)  TempSrc: Oral     SpO2:  95% 95% 93%  Weight:      Height:        Intake/Output Summary (Last 24 hours) at 07/14/2024 0930 Last data filed at 07/14/2024 0600 Gross per 24 hour  Intake 1021.63 ml  Output 1191 ml  Net -169.37 ml    LABS: Basic Metabolic Panel: Recent Labs    07/13/24 0417 07/14/24 0407  NA 140 136  K 4.0 3.9  CL 103 103  CO2 19* 16*  GLUCOSE 145* 137*  BUN 85* 101*  CREATININE 2.96* 3.70*  CALCIUM 8.0* 7.6*  MG 2.3 2.5*  PHOS 6.5* 8.0*   Liver Function Tests: Recent Labs    07/13/24 0415 07/14/24 0407  AST 71* 68*  ALT 20 14  ALKPHOS 108 96  BILITOT 13.2* 15.1*  PROT 6.7 6.6  ALBUMIN 2.0* 1.9*   No results for input(s): LIPASE, AMYLASE in the last 72 hours. CBC: Recent Labs    07/11/24 1619 07/12/24 0004 07/12/24 0402 07/13/24 0417 07/14/24 0406  WBC 27.9* 27.7*   < > 27.0* 23.9*  NEUTROABS 23.3* 22.6*  --   --   --   HGB 6.8* 9.1*   < > 9.0* 8.8*  HCT 20.9* 26.9*   < > 27.7* 27.7*  MCV 102.5* 97.5   < > 100.0 102.6*  PLT 53* 44*   < > 38* 38*   < > = values in this interval not displayed.   Cardiac Enzymes: No results for input(s): CKTOTAL, CKMB, CKMBINDEX, TROPONINI in the last 72 hours. BNP: Invalid input(s): POCBNP D-Dimer: No results for input(s): DDIMER in the last 72 hours. Hemoglobin A1C: No results for input(s): HGBA1C in the last 72 hours. Fasting Lipid Panel: Recent Labs    07/14/24 0407  TRIG 130   Thyroid Function Tests: No results for input(s): TSH, T4TOTAL, T3FREE, THYROIDAB in the last 72 hours.  Invalid input(s): FREET3 Anemia Panel: No results for input(s): VITAMINB12, FOLATE,  FERRITIN, TIBC, IRON, RETICCTPCT in the last 72 hours.   PHYSICAL EXAM General: Well developed, well nourished, in no acute distress HEENT:  Normocephalic and atramatic Neck:  No JVD.  Lungs: Clear bilaterally to auscultation and percussion. Heart: HRRR . Normal S1 and S2 without gallops or murmurs.  Abdomen: Bowel sounds are positive, abdomen soft and non-tender  Msk:  Back normal, normal gait. Normal strength and tone for age. Extremities: No clubbing, cyanosis or edema.   Neuro: Alert and oriented X 3. Psych:  Good affect, responds appropriately  TELEMETRY: Sinus rhythm  ASSESSMENT AND PLAN: # Possible endocarditis # Acute toxic metabolic encephalopathy # Alcoholic hepatitis # Severe thrombocytopenia # Multiorgan failure Patient initially presented for altered mental status, jaundice.  Treated for alcoholic hepatitis with sepsis, possible colitis.  Severely thrombocytopenic since admission.  Echo done 07/08/2024 with possible vegetation of aortic valve. cMRI with poor image quality, unable to rule out endocarditis. Did note aortic valve prolapse with moderate AR. - After discussion with PCCM team 07/11/24, given patients progressive multiorgan failure, increased pressor requirement TEE was cancelled. GOC discussion with patients significant other was held and code  status was changed to DNR - Interventions. Plan for further GOC discussions today with palliative medicine, considering comfort care.  - Further management of alcoholic hepatitis, metabolic encephalopathy, anemia and thrombocytopenia as per primary team. - Mildly elevated and flat trending troponins most consistent with demand/supply mismatch and not ACS in the setting of above illness.   ICD-10-CM   1. Sepsis with encephalopathy, due to unspecified organism, unspecified whether septic shock present (HCC)  A41.9    R65.20    G93.41     2. Liver disease  K76.9     3. Thrombocytopenia  D69.6     4. Anemia,  unspecified type  D64.9     5. Altered mental status, unspecified altered mental status type  R41.82       Principal Problem:   Acute metabolic encephalopathy Active Problems:   Staphylococcus epidermidis bacteremia   Liver disease   Altered mental status   Acute hypoxic respiratory failure (HCC)   Malnutrition of moderate degree   Sepsis with encephalopathy (HCC)   Thrombocytopenia    Denyse Bathe, MD, [facility] 07/14/2024 9:30 AM

## 2024-07-14 NOTE — Consult Note (Signed)
 PHARMACY CONSULT NOTE - ELECTROLYTES  Pharmacy Consult for Electrolyte Monitoring and Replacement   Recent Labs: Height: 5' 9 (175.3 cm) Weight: 64.3 kg (141 lb 12.1 oz) IBW/kg (Calculated) : 70.7 Estimated Creatinine Clearance: 18.3 mL/min (A) (by C-G formula based on SCr of 3.7 mg/dL (H)). Potassium (mmol/L)  Date Value  07/14/2024 3.9   Magnesium (mg/dL)  Date Value  88/98/7974 2.5 (H)   Calcium (mg/dL)  Date Value  88/98/7974 7.6 (L)   Albumin (g/dL)  Date Value  88/98/7974 1.9 (L)   Phosphorus (mg/dL)  Date Value  88/98/7974 8.0 (H)   Sodium (mmol/L)  Date Value  07/14/2024 136   Assessment  Sergio Curry is a 63 y.o. male presenting with altered mental status and jaundice. PMH significant for seizures and liver disease. Pharmacy has been consulted to monitor and replace electrolytes.  Diet: NPO - OG tube  Pivot 1.5 cal @ 20 mL/hr  Pertinent medications: free water 30 ml q4H  Goal of Therapy: Electrolytes WNL  Plan:  No replacement needed F/u with AM labs.   Thank you for allowing pharmacy to be a part of this patient's care.  Cathaleen Blanch, PharmD, BCPS 07/14/2024 9:20 AM

## 2024-07-15 DIAGNOSIS — A419 Sepsis, unspecified organism: Secondary | ICD-10-CM

## 2024-07-15 DIAGNOSIS — Z711 Person with feared health complaint in whom no diagnosis is made: Secondary | ICD-10-CM

## 2024-07-15 MED ORDER — FENTANYL CITRATE (PF) 50 MCG/ML IJ SOSY: 50.0000 ug | PREFILLED_SYRINGE | INTRAMUSCULAR | Status: DC | PRN

## 2024-07-15 MED ORDER — MORPHINE SULFATE (PF) 2 MG/ML IV SOLN
2.0000 mg | Freq: Once | INTRAVENOUS | Status: AC
  Administered 2024-07-15: 2 mg via INTRAVENOUS
  Filled 2024-07-15: qty 1

## 2024-07-15 NOTE — Discharge Summary (Signed)
 Physician Discharge Summary   Patient: Sergio Curry MRN: [id number] DOB: 09-26-1960  Admit date:     07/07/2024  Discharge date: 07/30/2024  Discharge Physician: Leita Blanch   PCP: Patient, No Pcp Per   Discharge Diagnoses: Principal Problem:   Acute metabolic encephalopathy Active Problems:   Staphylococcus epidermidis bacteremia   Liver disease   Altered mental status   Acute hypoxic respiratory failure (HCC)   Malnutrition of moderate degree   Sepsis with encephalopathy [facility]   Thrombocytopenia  63 y.o. male  with past medical history of EtOH abuse with alcoholic withdrawal seizures, liver disease, and bilateral hip replacement admitted on 07/07/2024 with AMS and jaundice.  Sergio Curry was admitted with septic shock likely due to sepsis with multiorgan failure, toxic metabolic encephalopathy secondary to alcohol use disorder with acute alcoholic hepatitis and liver failure. Sergio Curry  is now COMFORT CARE only after ICU attending d/w wife regarding overall poor prognosis.   #Mixed distributive and septic shock likely in the setting of sepsis with multiorgan failure.  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 # Thrombocytopenia, anemia could be in the setting of sepsis. # AKI with creatinine 1.77 mg/dL from a baseline of 0.4 mg/dL in 7979 last reading. DDX including HRS vs septic ATN.  #Flash pulmonary edema.   Overall poor prognosis Sergio Curry's wife is in agreement with IPU after she d/w hospice liaison I met with family friend at bedside Will transfer to [facility]     Pain control - [place]  Controlled Substance Reporting System database was reviewed. and patient was instructed, not to drive, operate  heavy machinery, perform activities at heights, swimming or participation in water activities or provide baby-sitting services while on Pain, Sleep and Anxiety Medications; until their outpatient Physician has advised to do so again. Also recommended to not to take more than prescribed Pain, Sleep and Anxiety Medications.  Disposition: Hospice care Diet recommendation:  Discharge Diet Orders (From admission, onward)     Start     Ordered   07/18/2024 0000  Diet - low sodium heart healthy        07/22/2024 1247            DISCHARGE MEDICATION: Allergies as of 07/22/2024   No Known Allergies      Medication List    You have not been prescribed any medications.     Follow-up Information     Custovic, Annalee, DO. Go in 2 week(s).   Specialty: Cardiology Contact information: [address] [place] [address] [phone]                   Condition at discharge: poor  The results of significant diagnostics from this hospitalization (including imaging, microbiology, ancillary and laboratory) are listed below for reference.   Imaging Studies: DG Chest Port 1 View Result Date: 07/12/2024 EXAM: 1 VIEW(S) XRAY OF THE CHEST 07/12/2024 09:04:00 AM COMPARISON: 2 days ago. CLINICAL HISTORY: [id number] Respiratory failure requiring intubation (HCC) [id number] Respiratory failure requiring intubation (HCC) FINDINGS: LINES, TUBES AND DEVICES: Endotracheal and nasogastric tubes are unchanged. Stable left internal jugular catheter. LUNGS AND PLEURA: Bilateral patchy airspace opacities most consistent with multifocal pneumonia. No pulmonary edema. No pleural effusion. No pneumothorax. HEART AND MEDIASTINUM: No acute abnormality of the  cardiac and mediastinal silhouettes. BONES AND SOFT TISSUES: Stable left rib fractures. IMPRESSION: 1. Stable bilateral patchy airspace opacities, consistent with multifocal pneumonia. 2. Stable left rib fractures. Electronically signed by: Lynwood Seip  MD 07/12/2024 09:10 AM EDT RP Workstation: [id number]   DG Abd 1 View Result Date: 07/10/2024 CLINICAL DATA:  NG placement. EXAM: ABDOMEN - 1 VIEW COMPARISON:  CT abdomen pelvis dated 07/07/2024. FINDINGS: Enteric tube with tip and side-port in the left upper abdomen the proximal stomach. No bowel dilatation. No acute osseous pathology. IMPRESSION: Enteric tube with tip and side-port in the proximal stomach. Electronically Signed   By: Vanetta Chou M.D.   On: 07/10/2024 14:36   DG Chest Port 1 View Result Date: 07/10/2024 CLINICAL DATA:  Central line placement. EXAM: PORTABLE CHEST 1 VIEW COMPARISON:  Chest radiograph dated 07/10/2024. FINDINGS: Left IJ central venous line with tip over central SVC. Endotracheal tube approximately 5 cm above the carina and enteric tube extends below diaphragm with tip beyond the inferior margin of the image. Bilateral pulmonary opacities as seen on the earlier radiograph. No pneumothorax. Stable cardiac silhouette. No acute osseous pathology. IMPRESSION: Left IJ central venous line with tip over central SVC. No pneumothorax. Electronically Signed   By: Vanetta Chou M.D.   On: 07/10/2024 14:34   DG Chest Port 1 View Result Date: 07/10/2024 EXAM: 1 VIEW XRAY OF THE CHEST 07/10/2024 11:13:29 AM COMPARISON: Same day. CLINICAL HISTORY: [id number] Encounter for intubation [id number]. Encounter for intubation Encounter for intubation FINDINGS: LINES, TUBES AND DEVICES: Endotracheal tube tip is seen 1 cm above the carina and directed slightly towards the right mainstem bronchus. Withdrawal by 2 to 3 cm is recommended. LUNGS AND PLEURA: Opacities are again noted consistent with pneumonia or possibly edema. No pleural effusion. No pneumothorax. HEART AND MEDIASTINUM: No acute abnormality of the cardiac and mediastinal silhouettes. BONES AND SOFT TISSUES: No acute osseous abnormality. IMPRESSION: 1. Endotracheal tube tip 1 cm above the carina with slight right mainstem orientation;  recommend withdrawal by 23 cm. 2. Opacities consistent with pneumonia or edema. Electronically signed by: Lynwood Seip MD 07/10/2024 11:30 AM EDT RP Workstation: [id number]   DG Chest Port 1 View Result Date: 07/10/2024 EXAM: 1 VIEW(S) XRAY OF THE CHEST 07/10/2024 03:26:54 AM COMPARISON: Chest x-ray dated 11/10/2023. CLINICAL HISTORY: [id number] Hypoxia FINDINGS: LUNGS AND PLEURA: Interval increase in bilateral perihilar, right greater than left, streaky consolidative airspace opacity. Left upper lobe patchy airspace opacities. Blunting of the right costophrenic angle with likely underlying trace pleural effusion. Left costophrenic angle is collimated off view. No pulmonary edema. No pneumothorax. HEART AND MEDIASTINUM: No acute abnormality of the cardiac and mediastinal silhouettes. BONES AND SOFT TISSUES: No acute osseous abnormality. IMPRESSION: 1. Interval increase in bilateral perihilar, right greater than left, consolidative and patchy airspace opacities, most consistent with multifocal pneumonia or aspiration pneumonitis. 2. Blunting of the right costophrenic angle with likely trace pleural effusion. Electronically signed by: Morgane Naveau MD 07/10/2024 03:47 AM EDT RP Workstation: [id number]   DG Chest Port 1 View Result Date: 07/09/2024 EXAM: 1 VIEW(S) XRAY OF THE CHEST 07/09/2024 04:45:00 PM COMPARISON: Chest x-ray 07/07/2024. CLINICAL HISTORY: [id number] Dyspnea [id number]. Dyspnea FINDINGS: LUNGS AND PLEURA: There is new bilateral multifocal airspace consolidation, right greater than left. This is predominantly central in location. No pulmonary edema. No pleural effusion. No pneumothorax. HEART AND MEDIASTINUM: No acute abnormality of the cardiac and mediastinal silhouettes. BONES AND SOFT TISSUES: There are multiple healed left-sided rib fractures. IMPRESSION: 1. New bilateral multifocal airspace  consolidation, right greater than left, predominantly central in location. Electronically signed by: Greig Pique MD 07/09/2024 09:42 PM EDT RP Workstation: [id number]   MR CARDIAC MORPHOLOGY W WO CONTRAST Result Date: 07/09/2024 CLINICAL DATA:  Aortic regurgitation, possible aortic vegetation EXAM: CARDIAC MRI TECHNIQUE: The patient was scanned on a 1.5 Tesla Siemens magnet. A dedicated cardiac coil was used. Functional imaging was done using Fiesta sequences. 2,3, and 4 chamber views were done to assess for RWMA's. Modified Simpson's rule using a short axis stack was used to calculate an ejection fraction on a dedicated work [employer]. The patient received 8 cc of Gadavist. After 10 minutes inversion recovery sequences were used to assess for infiltration and scar tissue. Velocity flow mapping performed in the ascending aorta and main pulmonary artery. CONTRAST:  8 cc  of Gadavist FINDINGS: 1. Normal left ventricular size and thickness. Mildly reduced LV systolic function (LVEF = 45%). There is global hypokinesis. There is no late gadolinium enhancement in the left ventricular myocardium. LVEDV: 172 ml LVESV: 94 ml SV: 77 ml CO: 4 L/min Myocardial mass: 90 g 2. Normal right ventricular size, thickness and low normal systolic function. There are no regional wall motion abnormalities. 3.  Normal left and right atrial size. 4. Normal size of the aortic root, ascending aorta and pulmonary artery. 5. Aortic valve prolapse, moderate aortic valve regurgitation. Regurgitant volume 51 ml, regurgitant fraction 53%. 6.  Normal pericardium.  No pericardial effusion. IMPRESSION: 1.  Mildly reduced LV systolic function.  LVEF 45% 2.  No LGE or scar. 3.  Aortic valve prolapse and moderate aortic regurgitation. 4.  Poor image quality, cannot rule out aortic valve vegetation. 5.  Normal RV systolic function. Electronically Signed   By: Redell Cave M.D.   On: 07/09/2024 17:11   MR CARDIAC VELOCITY FLOW MAP Result Date: 07/09/2024 CLINICAL DATA:  Aortic regurgitation, possible aortic vegetation EXAM:  CARDIAC MRI TECHNIQUE: The patient was scanned on a 1.5 Tesla Siemens magnet. A dedicated cardiac coil was used. Functional imaging was done using Fiesta sequences. 2,3, and 4 chamber views were done to assess for RWMA's. Modified Simpson's rule using a short axis stack was used to calculate an ejection fraction on a dedicated work [employer]. The patient received 8 cc of Gadavist. After 10 minutes inversion recovery sequences were used to assess for infiltration and scar tissue. Velocity flow mapping performed in the ascending aorta and main pulmonary artery. CONTRAST:  8 cc  of Gadavist FINDINGS: 1. Normal left ventricular size and thickness. Mildly reduced LV systolic function (LVEF = 45%). There is global hypokinesis. There is no late gadolinium enhancement in the left ventricular myocardium. LVEDV: 172 ml LVESV: 94 ml SV: 77 ml CO: 4 L/min Myocardial mass: 90 g 2. Normal right ventricular size, thickness and low normal systolic function. There are no regional wall motion abnormalities. 3.  Normal left and right atrial size. 4. Normal size of the aortic root, ascending aorta and pulmonary artery. 5. Aortic valve prolapse, moderate aortic valve regurgitation. Regurgitant volume 51 ml, regurgitant fraction 53%. 6.  Normal pericardium.  No pericardial effusion. IMPRESSION: 1.  Mildly reduced LV systolic function.  LVEF 45% 2.  No LGE or scar. 3.  Aortic valve prolapse and moderate aortic regurgitation. 4.  Poor image quality, cannot rule out aortic valve vegetation. 5.  Normal RV systolic function. Electronically Signed   By: Redell Cave M.D.   On: 07/09/2024 17:11   MR CARDIAC VELOCITY  FLOW MAP Result Date: 07/09/2024 CLINICAL DATA:  Aortic regurgitation, possible aortic vegetation EXAM: CARDIAC MRI TECHNIQUE: The patient was scanned on a 1.5 Tesla Siemens magnet. A dedicated cardiac coil was used. Functional imaging was done using Fiesta sequences. 2,3, and 4 chamber views were done  to assess for RWMA's. Modified Simpson's rule using a short axis stack was used to calculate an ejection fraction on a dedicated work [employer]. The patient received 8 cc of Gadavist. After 10 minutes inversion recovery sequences were used to assess for infiltration and scar tissue. Velocity flow mapping performed in the ascending aorta and main pulmonary artery. CONTRAST:  8 cc  of Gadavist FINDINGS: 1. Normal left ventricular size and thickness. Mildly reduced LV systolic function (LVEF = 45%). There is global hypokinesis. There is no late gadolinium enhancement in the left ventricular myocardium. LVEDV: 172 ml LVESV: 94 ml SV: 77 ml CO: 4 L/min Myocardial mass: 90 g 2. Normal right ventricular size, thickness and low normal systolic function. There are no regional wall motion abnormalities. 3.  Normal left and right atrial size. 4. Normal size of the aortic root, ascending aorta and pulmonary artery. 5. Aortic valve prolapse, moderate aortic valve regurgitation. Regurgitant volume 51 ml, regurgitant fraction 53%. 6.  Normal pericardium.  No pericardial effusion. IMPRESSION: 1.  Mildly reduced LV systolic function.  LVEF 45% 2.  No LGE or scar. 3.  Aortic valve prolapse and moderate aortic regurgitation. 4.  Poor image quality, cannot rule out aortic valve vegetation. 5.  Normal RV systolic function. Electronically Signed   By: Redell Cave M.D.   On: 07/09/2024 17:11   ECHOCARDIOGRAM COMPLETE Result Date: 07/08/2024    ECHOCARDIOGRAM REPORT   Patient Name:   LILLIE BOLLIG Date of Exam: 07/08/2024 Medical Rec #:  [id number]        Height:       69.0 in Accession #:    [id number]       Weight:       129.4 lb Date of Birth:  [birth year]         BSA:          1.717 m Patient Age:    63 years         BP:           133/32 mmHg Patient Gender: M                HR:           84 bpm. Exam Location:  [facility] Procedure: 2D Echo, Cardiac Doppler and Color Doppler (Both Spectral and Color             Flow Doppler were utilized during procedure). Indications:     Elevated Troponin  History:         Patient has no prior history of Echocardiogram examinations.  Sonographer:     Thedora Louder RDCS, FASE Referring Phys:  [id number] LONELL KANDICE MOOSE Diagnosing Phys: Cara JONETTA Lovelace MD  Sonographer Comments: Technically difficult study due to poor echo windows. Image acquisition challenging due to patient behavioral factors. IMPRESSIONS  1. Possible SBE of anterior leaflet. Recommend TEE.  2. Technically difficult study.  3. Left ventricular ejection fraction, by estimation, is 45 to 50%. The left ventricle has mildly decreased function. The left ventricle demonstrates global hypokinesis. The left ventricular internal cavity size was moderately to severely dilated. Left ventricular diastolic function could not be evaluated.  4. Right ventricular systolic function is normal. The  right ventricular size is normal.  5. Left atrial size was mildly dilated.  6. The mitral valve is myxomatous. Trivial mitral valve regurgitation.  7. 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 SBE can not be excluded. Consider TEE.. The aortic valve is calcified. Aortic valve regurgitation is mild to moderate. Aortic valve sclerosis/calcification is present, without any evidence of aortic stenosis. Conclusion(s)/Recommendation(s): Poor windows for evaluation of left ventricular function by transthoracic echocardiography. Would recommend an alternative means of evaluation. Findings concerning for aortic valve vegetation, would recommend a Transesophageal Echocardiogram for clarification. FINDINGS  Left Ventricle: Left ventricular ejection fraction, by estimation, is 45 to 50%. The left ventricle has mildly decreased function. The left ventricle demonstrates global hypokinesis. Strain was performed and the global longitudinal strain is indeterminate. The left ventricular internal cavity size was moderately to severely  dilated. There is no left ventricular hypertrophy. Left ventricular diastolic function could not be evaluated. Right Ventricle: The right ventricular size is normal. No increase in right ventricular wall thickness. Right ventricular systolic function is normal. Left Atrium: Left atrial size was mildly dilated. Right Atrium: Right atrial size was normal in size. Pericardium: There is no evidence of pericardial effusion. Mitral Valve: The mitral valve is myxomatous. Trivial mitral valve regurgitation. Tricuspid Valve: The tricuspid valve is normal in structure. Tricuspid valve regurgitation is trivial. Aortic Valve: Mobile mass on anterior leaflet prolapsing inyo the outflow tract. SBE can not be excluded. Consider TEE. The aortic valve is calcified. Aortic valve regurgitation is mild to moderate. Aortic valve sclerosis/calcification is present, without any evidence of aortic stenosis. Aortic valve peak gradient measures 8.2 mmHg. Pulmonic Valve: The pulmonic valve was normal in structure. Pulmonic valve regurgitation is not visualized. Aorta: The ascending aorta was not well visualized. IAS/Shunts: No atrial level shunt detected by color flow Doppler. Additional Comments: Possible SBE of anterior leaflet. Recommend TEE. Technically difficult study. 3D was performed not requiring image post processing on an independent workstation and was indeterminate.  LEFT VENTRICLE PLAX 2D LVIDd:         6.10 cm LVIDs:         4.70 cm LV PW:         1.10 cm LV IVS:        1.00 cm  LV Volumes (MOD) LV vol d, MOD A4C: 90.4 ml LV vol s, MOD A4C: 29.6 ml LV SV MOD A4C:     90.4 ml LEFT ATRIUM           Index LA diam:      4.10 cm 2.39 cm/m LA Vol (A4C): 26.0 ml 15.14 ml/m  AORTIC VALVE              PULMONIC VALVE AV Vmax:      143.00 cm/s RVOT Peak grad: 1 mmHg AV Peak Grad: 8.2 mmHg LVOT Vmax:    91.80 cm/s LVOT Vmean:   62.000 cm/s LVOT VTI:     0.196 m  AORTA Ao Asc diam: 3.10 cm  SHUNTS Systemic VTI: 0.20 m Cara JONETTA Lovelace MD  Electronically signed by Cara JONETTA Lovelace MD Signature Date/Time: 07/08/2024/11:40:39 AM    Final    US  ABDOMEN LIMITED WITH LIVER DOPPLER Result Date: 07/08/2024 CLINICAL DATA:  63 year old male with history of hyperbilirubinemia. EXAM: DUPLEX ULTRASOUND OF LIVER TECHNIQUE: Color and duplex Doppler ultrasound was performed to evaluate the hepatic in-flow and out-flow vessels. COMPARISON:  None Available. FINDINGS: Liver: Diffusely coarsened echotexture with increased echogenicity normal hepatic contour without nodularity.  No focal lesion, mass or intrahepatic biliary ductal dilatation. Main Portal Vein size: 0.83 cm Portal Vein Velocities Main Prox:  23 cm/sec, antegrade Main Dist:  23 cm/sec, antegrade Right: Not visualized. Left: Not visualized. Hepatic Vein Velocities Right: Not visualized. Middle:  14.5 cm/sec Left: Not visualized. IVC: Present and patent with normal respiratory phasicity. Hepatic Artery Velocity:  216 cm/sec Splenic Vein Velocity: Not visualized. Spleen: 8.8 cm x 8.1 cm x 3.6 cm with a total volume of 134 cm^3 (411 cm^3 is upper limit normal) Portal Vein Occlusion/Thrombus: No Splenic Vein Occlusion/Thrombus: No Ascites: None Varices: None IMPRESSION: 1. Limited evaluation. The visualized portal vasculature is patent with antegrade flow. 2. Diffusely coarsened and echogenic hepatic parenchyma as could be seen with hepatic steatosis or acute hepatitis. 3. No evidence of biliary ductal dilation. Ester Sides, MD [facility] Electronically Signed   By: Ester Sides M.D.   On: 07/08/2024 06:30   CT ABDOMEN PELVIS W CONTRAST Result Date: 07/07/2024 CLINICAL DATA:  Acute abdominal pain. Altered mental status and jaundice. EXAM: CT ABDOMEN AND PELVIS WITH CONTRAST TECHNIQUE: Multidetector CT imaging of the abdomen and pelvis was performed using the standard protocol following bolus administration of intravenous contrast. RADIATION  DOSE REDUCTION: This exam was performed according to the departmental dose-optimization program which includes automated exposure control, adjustment of the mA and/or kV according to patient size and/or use of iterative reconstruction technique. CONTRAST:  75mL OMNIPAQUE IOHEXOL 300 MG/ML  SOLN COMPARISON:  None Available. FINDINGS: Lower chest: Bandlike areas of atelectasis within both lower lobes. Trace pleural thickening without significant effusion. Hepatobiliary: Enlarged liver spanning 18.9 cm cranial caudal. Advanced hepatic steatosis. Allowing for motion artifact, no focal liver abnormality. There may be subtle capsular nodularity, although motion obscures assessment. Small gallstone within minimally distended gallbladder. No biliary dilatation. Pancreas: Parenchymal atrophy. No ductal dilatation or inflammation. No evidence of pancreatic mass. Spleen: No splenomegaly.  No focal splenic abnormality. Adrenals/Urinary Tract: No adrenal nodule. No hydronephrosis. Symmetric bilateral perinephric stranding. No renal calculi or suspicious renal abnormality. Absent excretion on delayed phase imaging. Partially distended urinary bladder, mildly thick walled. Stomach/Bowel: Small hiatal hernia. Decompressed stomach. Small bowel is decompressed. Normal appendix is tentatively visualized, regardless no appendicitis. Mild wall thickening about the ascending and hepatic flexure of the colon. Moderate stool within the left colon. No bowel obstruction. Vascular/Lymphatic: Aortic atherosclerosis. No aneurysm. The portal vein is patent allowing for motion. No abdominopelvic adenopathy. Reproductive: Prostate is unremarkable. Other: Trace free fluid in the pelvis, but no significant ascites. No free air. No abdominal wall hernia. Musculoskeletal: Remote left rib fractures. Chronic hip arthropathy and postsurgical change. No acute osseous findings. IMPRESSION: 1. Hepatomegaly and advanced hepatic steatosis. There may be subtle  capsular nodularity, although motion obscures assessment. Recommend correlation with any clinical or laboratory findings of cirrhosis. 2. Mild wall thickening about the ascending and hepatic flexure of the colon, suspicious for colitis. 3. Cholelithiasis without gallbladder inflammation. 4. Absent renal excretion on delayed phase imaging, often seen with renal dysfunction. Aortic Atherosclerosis (ICD10-I70.0). Electronically Signed   By: Andrea Gasman M.D.   On: 07/07/2024 14:19   DG Chest Port 1 View Result Date: 07/07/2024 CLINICAL DATA:  Possible sepsis. Altered mental status and jaundice. Hypothermia. EXAM: PORTABLE CHEST 1 VIEW COMPARISON:  Head FINDINGS: The heart size and mediastinal contours are within normal limits. No consolidation, effusion, or pneumothorax. Old rib fractures are present bilaterally. IMPRESSION: No active disease. Electronically Signed   By: Leita  Waddell M.D.   On: 07/07/2024 14:15   CT Head Wo Contrast Result Date: 07/07/2024 EXAM: CT HEAD WITHOUT CONTRAST 07/07/2024 12:59:28 PM TECHNIQUE: CT of the head was performed without the administration of intravenous contrast. Automated exposure control, iterative reconstruction, and/or weight based adjustment of the mA/kV was utilized to reduce the radiation dose to as low as reasonably achievable. COMPARISON: 01/06/2016 CLINICAL HISTORY: Mental status change, unknown cause. AMS. FINDINGS: BRAIN AND VENTRICLES: No acute hemorrhage. No evidence of acute infarct. No hydrocephalus. No extra-axial collection. No mass effect or midline shift. Prominence of the sulci and ventricles compatible with brain atrophy. Hypoattenuating foci in the cerebral white matter, most likely representing chronic small vessel disease. ORBITS: No acute abnormality. SINUSES: No acute abnormality. SOFT TISSUES AND SKULL: No acute soft tissue abnormality. No skull fracture. IMPRESSION: 1. No acute intracranial abnormality. Electronically signed by: Waddell Calk MD 07/07/2024 01:04 PM EDT RP Workstation: [id number]    Microbiology: Results for orders placed or performed during the hospital encounter of 07/07/24  Blood Culture (routine x 2)     Status: Abnormal   Collection Time: 07/07/24  1:40 PM   Specimen: BLOOD LEFT ARM  Result Value Ref Range Status   Specimen Description   Final    BLOOD LEFT ARM Performed at [facility], [address], [place], [place] [address]    Special Requests   Final    BOTTLES DRAWN AEROBIC AND ANAEROBIC Blood Culture results may not be optimal due to an inadequate volume of blood received in culture bottles Performed at [facility], [address], [place], [place] [address]    Culture  Setup Time   Final    GRAM POSITIVE COCCI IN BOTH AEROBIC AND ANAEROBIC BOTTLES CRITICAL VALUE NOTED.  VALUE IS CONSISTENT WITH PREVIOUSLY REPORTED AND CALLED VALUE.    Culture (A)  Final    STAPHYLOCOCCUS EPIDERMIDIS SUSCEPTIBILITIES PERFORMED ON PREVIOUS CULTURE WITHIN THE LAST 5 DAYS. Performed at [facility], [address], [place], [place] [address]    Report Status 07/10/2024 FINAL  Final  Blood Culture (routine x 2)     Status: Abnormal   Collection Time: 07/07/24  1:40 PM   Specimen: BLOOD RIGHT ARM  Result Value Ref Range Status   Specimen Description   Final    BLOOD RIGHT ARM Performed at [facility], [address] [place], [place] [address]    Special Requests   Final    BOTTLES DRAWN AEROBIC AND ANAEROBIC Blood Culture results may not be optimal due to an inadequate volume of blood received in culture bottles Performed at [facility], [address] [place], [place] [address]    Culture  Setup Time   Final    IN BOTH AEROBIC AND ANAEROBIC BOTTLES GRAM POSITIVE COCCI CRITICAL RESULT CALLED TO, READ BACK BY AND VERIFIED WITH: JASON ROBBINS @ 07/08/2024 0328 AB    Culture STAPHYLOCOCCUS EPIDERMIDIS (A)  Final   Report Status 07/10/2024 FINAL   Final   Organism ID, Bacteria STAPHYLOCOCCUS EPIDERMIDIS  Final      Susceptibility   Staphylococcus epidermidis - MIC*    CIPROFLOXACIN  <=0.5 SENSITIVE Sensitive     ERYTHROMYCIN >=8 RESISTANT Resistant     GENTAMICIN <=0.5 SENSITIVE Sensitive     OXACILLIN <=0.25 SENSITIVE Sensitive     TETRACYCLINE <=1 SENSITIVE Sensitive     VANCOMYCIN 1 SENSITIVE Sensitive     TRIMETH/SULFA <=10 SENSITIVE Sensitive     CLINDAMYCIN <=0.25 SENSITIVE Sensitive  RIFAMPIN <=0.5 SENSITIVE Sensitive     Inducible Clindamycin NEGATIVE Sensitive     * STAPHYLOCOCCUS EPIDERMIDIS  Blood Culture ID Panel (Reflexed)     Status: Abnormal   Collection Time: 07/07/24  1:40 PM  Result Value Ref Range Status   Enterococcus faecalis NOT DETECTED NOT DETECTED Final   Enterococcus Faecium NOT DETECTED NOT DETECTED Final   Listeria monocytogenes NOT DETECTED NOT DETECTED Final   Staphylococcus species DETECTED (A) NOT DETECTED Final    Comment: CRITICAL RESULT CALLED TO, READ BACK BY AND VERIFIED WITH: JASON ROBBINS @ 07/08/2024 0328 AB    Staphylococcus aureus (BCID) NOT DETECTED NOT DETECTED Final   Staphylococcus epidermidis DETECTED (A) NOT DETECTED Final    Comment: CRITICAL RESULT CALLED TO, READ BACK BY AND VERIFIED WITH: JASON ROBBINS @ 07/08/2024 0328 AB    Staphylococcus lugdunensis NOT DETECTED NOT DETECTED Final   Streptococcus species NOT DETECTED NOT DETECTED Final   Streptococcus agalactiae NOT DETECTED NOT DETECTED Final   Streptococcus pneumoniae NOT DETECTED NOT DETECTED Final   Streptococcus pyogenes NOT DETECTED NOT DETECTED Final   A.calcoaceticus-baumannii NOT DETECTED NOT DETECTED Final   Bacteroides fragilis NOT DETECTED NOT DETECTED Final   Enterobacterales NOT DETECTED NOT DETECTED Final   Enterobacter cloacae complex NOT DETECTED NOT DETECTED Final   Escherichia coli NOT DETECTED NOT DETECTED Final   Klebsiella aerogenes NOT DETECTED NOT DETECTED Final   Klebsiella oxytoca NOT  DETECTED NOT DETECTED Final   Klebsiella pneumoniae NOT DETECTED NOT DETECTED Final   Proteus species NOT DETECTED NOT DETECTED Final   Salmonella species NOT DETECTED NOT DETECTED Final   Serratia marcescens NOT DETECTED NOT DETECTED Final   Haemophilus influenzae NOT DETECTED NOT DETECTED Final   Neisseria meningitidis NOT DETECTED NOT DETECTED Final   Pseudomonas aeruginosa NOT DETECTED NOT DETECTED Final   Stenotrophomonas maltophilia NOT DETECTED NOT DETECTED Final   Candida albicans NOT DETECTED NOT DETECTED Final   Candida auris NOT DETECTED NOT DETECTED Final   Candida glabrata NOT DETECTED NOT DETECTED Final   Candida krusei NOT DETECTED NOT DETECTED Final   Candida parapsilosis NOT DETECTED NOT DETECTED Final   Candida tropicalis NOT DETECTED NOT DETECTED Final   Cryptococcus neoformans/gattii NOT DETECTED NOT DETECTED Final   Methicillin resistance mecA/C NOT DETECTED NOT DETECTED Final    Comment: Performed at [facility], [address] [place], [place] [address]  MRSA Next Gen by PCR, Nasal     Status: None   Collection Time: 07/07/24  3:15 PM   Specimen: Nasal Mucosa; Nasal Swab  Result Value Ref Range Status   MRSA by PCR Next Gen NOT DETECTED NOT DETECTED Final    Comment: (NOTE) The GeneXpert MRSA Assay (FDA approved for NASAL specimens only), is one component of a comprehensive MRSA colonization surveillance program. It is not intended to diagnose MRSA infection nor to guide or monitor treatment for MRSA infections. Test performance is not FDA approved in patients less than 80 years old. Performed at [facility], [address] [place], [place] [address]   Resp panel by RT-PCR (RSV, Flu A&B, Covid) Anterior Nasal Swab     Status: None   Collection Time: 07/07/24  3:44 PM   Specimen: Anterior Nasal Swab  Result Value Ref Range Status   SARS Coronavirus 2 by RT PCR NEGATIVE NEGATIVE Final    Comment: (NOTE) SARS-CoV-2 target nucleic acids  are NOT DETECTED.  The SARS-CoV-2 RNA is generally detectable in upper respiratory specimens during the  acute phase of infection. The lowest concentration of SARS-CoV-2 viral copies this assay can detect is 138 copies/mL. A negative result does not preclude SARS-Cov-2 infection and should not be used as the sole basis for treatment or other patient management decisions. A negative result may occur with  improper specimen collection/handling, submission of specimen other than nasopharyngeal swab, presence of viral mutation(s) within the areas targeted by this assay, and inadequate number of viral copies(<138 copies/mL). A negative result must be combined with clinical observations, patient history, and epidemiological information. The expected result is Negative.  Fact Sheet for Patients:  bloggercourse.com  Fact Sheet for Healthcare Providers:  seriousbroker.it  This test is no t yet approved or cleared by the [place]  FDA and  has been authorized for detection and/or diagnosis of SARS-CoV-2 by FDA under an Emergency Use Authorization (EUA). This EUA will remain  in effect (meaning this test can be used) for the duration of the COVID-19 declaration under Section 564(b)(1) of the Act, 21 U.S.C.section 360bbb-3(b)(1), unless the authorization is terminated  or revoked sooner.       Influenza A by PCR NEGATIVE NEGATIVE Final   Influenza B by PCR NEGATIVE NEGATIVE Final    Comment: (NOTE) The Xpert Xpress SARS-CoV-2/FLU/RSV plus assay is intended as an aid in the diagnosis of influenza from Nasopharyngeal swab specimens and should not be used as a sole basis for treatment. Nasal washings and aspirates are unacceptable for Xpert Xpress SARS-CoV-2/FLU/RSV testing.  Fact Sheet for Patients: bloggercourse.com  Fact Sheet for Healthcare Providers: seriousbroker.it  This test is  not yet approved or cleared by the [place]  FDA and has been authorized for detection and/or diagnosis of SARS-CoV-2 by FDA under an Emergency Use Authorization (EUA). This EUA will remain in effect (meaning this test can be used) for the duration of the COVID-19 declaration under Section 564(b)(1) of the Act, 21 U.S.C. section 360bbb-3(b)(1), unless the authorization is terminated or revoked.     Resp Syncytial Virus by PCR NEGATIVE NEGATIVE Final    Comment: (NOTE) Fact Sheet for Patients: bloggercourse.com  Fact Sheet for Healthcare Providers: seriousbroker.it  This test is not yet approved or cleared by the [place]  FDA and has been authorized for detection and/or diagnosis of SARS-CoV-2 by FDA under an Emergency Use Authorization (EUA). This EUA will remain in effect (meaning this test can be used) for the duration of the COVID-19 declaration under Section 564(b)(1) of the Act, 21 U.S.C. section 360bbb-3(b)(1), unless the authorization is terminated or revoked.  Performed at [facility], [address] [place], [place] [address]   Respiratory (~20 pathogens) panel by PCR     Status: None   Collection Time: 07/07/24  3:44 PM   Specimen: Nasopharyngeal Swab; Respiratory  Result Value Ref Range Status   Adenovirus NOT DETECTED NOT DETECTED Final   Coronavirus 229E NOT DETECTED NOT DETECTED Final    Comment: (NOTE) The Coronavirus on the Respiratory Panel, DOES NOT test for the novel  Coronavirus (2019 nCoV)    Coronavirus HKU1 NOT DETECTED NOT DETECTED Final   Coronavirus NL63 NOT DETECTED NOT DETECTED Final   Coronavirus OC43 NOT DETECTED NOT DETECTED Final   Metapneumovirus NOT DETECTED NOT DETECTED Final   Rhinovirus / Enterovirus NOT DETECTED NOT DETECTED Final   Influenza A NOT DETECTED NOT DETECTED Final   Influenza B NOT DETECTED NOT DETECTED Final   Parainfluenza Virus 1 NOT DETECTED NOT  DETECTED Final   Parainfluenza Virus 2 NOT DETECTED NOT DETECTED  Final   Parainfluenza Virus 3 NOT DETECTED NOT DETECTED Final   Parainfluenza Virus 4 NOT DETECTED NOT DETECTED Final   Respiratory Syncytial Virus NOT DETECTED NOT DETECTED Final   Bordetella pertussis NOT DETECTED NOT DETECTED Final   Bordetella Parapertussis NOT DETECTED NOT DETECTED Final   Chlamydophila pneumoniae NOT DETECTED NOT DETECTED Final   Mycoplasma pneumoniae NOT DETECTED NOT DETECTED Final    Comment: Performed at [facility], [address] [place], [place] [address]  Culture, blood (Routine X 2) w Reflex to ID Panel     Status: None   Collection Time: 07/09/24 10:52 AM   Specimen: BLOOD  Result Value Ref Range Status   Specimen Description BLOOD BLOOD LEFT HAND  Final   Special Requests   Final    BOTTLES DRAWN AEROBIC ONLY Blood Culture adequate volume   Culture   Final    NO GROWTH 5 DAYS Performed at [facility], [address] [place], [place] [address]    Report Status 07/14/2024 FINAL  Final  Culture, blood (Routine X 2) w Reflex to ID Panel     Status: None   Collection Time: 07/09/24 11:07 AM   Specimen: BLOOD  Result Value Ref Range Status   Specimen Description BLOOD BLOOD LEFT HAND  Final   Special Requests   Final    BOTTLES DRAWN AEROBIC AND ANAEROBIC Blood Culture adequate volume   Culture   Final    NO GROWTH 5 DAYS Performed at [facility], [address], [place], [place] [address]    Report Status 07/14/2024 FINAL  Final  Culture, Respiratory w Gram Stain     Status: None   Collection Time: 07/10/24  3:52 PM   Specimen: Tracheal Aspirate; Respiratory  Result Value Ref Range Status   Specimen Description   Final    TRACHEAL ASPIRATE Performed at [facility], [address] [place], [place] [address]    Special Requests   Final    NONE Performed at [facility], [address] [place], [place] [address]    Gram Stain    Final    FEW WBC PRESENT, PREDOMINANTLY PMN NO ORGANISMS SEEN    Culture   Final    NO GROWTH 2 DAYS Performed at [facility], [address] [place], [place] [address]    Report Status 07/12/2024 FINAL  Final    Labs: CBC: Recent Labs  Lab 07/09/24 1052 07/10/24 0313 07/11/24 0511 07/11/24 1619 07/12/24 0004 07/12/24 0402 07/12/24 1153 07/12/24 1848 07/13/24 0417 07/14/24 0406  WBC 17.8*   < > 26.0*  25.8* 27.9* 27.7* 27.1* 27.0*  --  27.0* 23.9*  NEUTROABS 13.4*  --  18.7* 23.3* 22.6*  --   --   --   --   --   HGB 8.3*   < > 7.2*  7.2* 6.8* 9.1* 9.1* 8.9* 9.0* 9.0* 8.8*  HCT 24.6*   < > 21.7*  21.6* 20.9* 26.9* 27.4* 26.7* 27.2* 27.7* 27.7*  MCV 98.8   < > 101.4*  101.4* 102.5* 97.5 98.9 100.4*  --  100.0 102.6*  PLT 13*   < > 20*  20* 53* 44* 41* 36*  --  38* 38*   < > = values in this interval not displayed.   Basic Metabolic Panel: Recent Labs  Lab 07/10/24 0313 07/11/24 0005 07/11/24 0511 07/12/24 0402 07/13/24 0417 07/14/24 0407  NA 138 140 138 139 140 136  K 3.5  4.0 3.7 3.9 4.0 3.9  CL 101 101 99 104 103 103  CO2 21* 20* 21* 19* 19* 16*  GLUCOSE 106* 152* 157* 197* 145* 137*  BUN 56* 60* 62* 69* 85* 101*  CREATININE 1.80* 1.96* 2.08* 2.26* 2.96* 3.70*  CALCIUM 7.7* 8.0* 7.9* 8.0* 8.0* 7.6*  MG 2.3  --  2.2 2.3 2.3 2.5*  PHOS 3.4 3.8 3.5 5.9* 6.5* 8.0*   Liver Function Tests: Recent Labs  Lab 07/10/24 0313 07/11/24 0005 07/11/24 0511 07/12/24 0402 07/13/24 0415 07/14/24 0407  AST 94*  --  95* 78* 71* 68*  ALT 25  --  23 22 20 14   ALKPHOS 119  --  114 119 108 96  BILITOT 10.1*  --  12.1* 12.5* 13.2* 15.1*  PROT 6.0*  --  6.4* 6.8 6.7 6.6  ALBUMIN 1.6* 2.1* 2.2* 2.1* 2.0* 1.9*   CBG: Recent Labs  Lab 07/13/24 1543 07/13/24 1916 07/13/24 2340 07/14/24 0409 07/14/24 0737  GLUCAP 133* 144* 139* 129* 125*    Discharge time spent: greater than 30 minutes.  Signed: Leita Blanch, MD [facility] 07/31/2024

## 2024-07-15 NOTE — TOC Progression Note (Signed)
 Transition of Care [facility]) - Progression Note    Patient Details  Name: Sergio Curry MRN: [id number] Date of Birth: [birth year]  [facility]) CM/SW Contact  Victory Jackquline RAMAN, RN Phone Number: 08/02/2024, 4:33 PM  Clinical Narrative:   11:14am: RNCM received a secure chat from Kara with [facility] informing me that the patient's family was interested in transitioning the patient to [facility]. RNCM informed Saddie that it was okay to evaluate the patient for IPU. RNCM will continue to follow for discharge planning/care coordination and update as applicable.   12:35pm: Saddie informed me via secure chat that the patient has been approved for IPU. Nurse to call report to the IPU at [phone]. She will arrange [facility] pick up once I have confirmed hospice consents are complete.       Barriers to Discharge: Continued Medical Work up               Expected Discharge Plan and Services         Expected Discharge Date: 07/28/2024                                     Social Drivers of Health (SDOH) Interventions SDOH Screenings   Food Insecurity: No Food Insecurity (07/07/2024)  Housing: Low Risk  (07/07/2024)  Transportation Needs: No Transportation Needs (07/07/2024)  Utilities: Not At Risk (07/07/2024)  Tobacco Use: Medium Risk (05/21/2020)    Readmission Risk Interventions     No data to display

## 2024-07-15 NOTE — Progress Notes (Signed)
 SUBJECTIVE: Patient was made comfort care and extubated yesterday.  Remains unconscious and not eating with his life partner sitting on bedside.   Vitals:   07/14/24 1100 07/14/24 1115 07/14/24 1200 07/14/24 1255  BP: (!) 147/44   (!) 95/35  Pulse: 100  93 89  Resp: 16  (!) 9 16  Temp: (!) 100.4 F (38 C)   98.2 F (36.8 C)  TempSrc:      SpO2: 92% (!) 61% (!) 85% 95%  Weight:      Height:        Intake/Output Summary (Last 24 hours) at 08/03/2024 0921 Last data filed at 07/18/2024 0510 Gross per 24 hour  Intake --  Output 200 ml  Net -200 ml    LABS: Basic Metabolic Panel: Recent Labs    07/13/24 0417 07/14/24 0407  NA 140 136  K 4.0 3.9  CL 103 103  CO2 19* 16*  GLUCOSE 145* 137*  BUN 85* 101*  CREATININE 2.96* 3.70*  CALCIUM 8.0* 7.6*  MG 2.3 2.5*  PHOS 6.5* 8.0*   Liver Function Tests: Recent Labs    07/13/24 0415 07/14/24 0407  AST 71* 68*  ALT 20 14  ALKPHOS 108 96  BILITOT 13.2* 15.1*  PROT 6.7 6.6  ALBUMIN 2.0* 1.9*   No results for input(s): LIPASE, AMYLASE in the last 72 hours. CBC: Recent Labs    07/13/24 0417 07/14/24 0406  WBC 27.0* 23.9*  HGB 9.0* 8.8*  HCT 27.7* 27.7*  MCV 100.0 102.6*  PLT 38* 38*   Cardiac Enzymes: No results for input(s): CKTOTAL, CKMB, CKMBINDEX, TROPONINI in the last 72 hours. BNP: Invalid input(s): POCBNP D-Dimer: No results for input(s): DDIMER in the last 72 hours. Hemoglobin A1C: No results for input(s): HGBA1C in the last 72 hours. Fasting Lipid Panel: Recent Labs    07/14/24 0407  TRIG 130   Thyroid Function Tests: No results for input(s): TSH, T4TOTAL, T3FREE, THYROIDAB in the last 72 hours.  Invalid input(s): FREET3 Anemia Panel: No results for input(s): VITAMINB12, FOLATE, FERRITIN, TIBC, IRON, RETICCTPCT in the last 72 hours.   PHYSICAL EXAM General: Well developed, well nourished, in no acute distress HEENT:  Normocephalic and atramatic Neck:   No JVD.  Lungs: Clear bilaterally to auscultation and percussion. Heart: HRRR . Normal S1 and S2 without gallops or murmurs.  Abdomen: Bowel sounds are positive, abdomen soft and non-tender  Msk:  Back normal, normal gait. Normal strength and tone for age. Extremities: No clubbing, cyanosis or edema.   Neuro: Alert and oriented X 3. Psych:  Good affect, responds appropriately  TELEMETRY: Not on monitor.  ASSESSMENT AND PLAN:  # Possible endocarditis # Acute toxic metabolic encephalopathy # Alcoholic hepatitis # Severe thrombocytopenia # Multiorgan failure Patient initially presented for altered mental status, jaundice.  Treated for alcoholic hepatitis with sepsis, possible colitis.  Severely thrombocytopenic since admission.  Echo done 07/08/2024 with possible vegetation of aortic valve. cMRI with poor image quality, unable to rule out endocarditis. Did note aortic valve prolapse with moderate AR. - After discussion with PCCM team 07/11/24, given patients progressive multiorgan failure, increased pressor requirement TEE was cancelled. GOC discussion with patients significant other was held and code status was changed to DNR - Interventions. Plan for further GOC discussions today with palliative medicine, considering comfort care.  - Further management of alcoholic hepatitis, metabolic encephalopathy, anemia and thrombocytopenia as per primary team. - Mildly elevated and flat trending troponins most consistent with demand/supply mismatch and not ACS  in the setting of above illness. Patient was extubated yesterday and has been made comfort care.         ICD-10-CM   1. Sepsis with encephalopathy, due to unspecified organism, unspecified whether septic shock present (HCC)  A41.9    R65.20    G93.41     2. Liver disease  K76.9     3. Thrombocytopenia  D69.6     4. Anemia, unspecified type  D64.9     5. Altered mental status, unspecified altered mental status type  R41.82        Principal Problem:   Acute metabolic encephalopathy Active Problems:   Staphylococcus epidermidis bacteremia   Liver disease   Altered mental status   Acute hypoxic respiratory failure (HCC)   Malnutrition of moderate degree   Sepsis with encephalopathy (HCC)   Thrombocytopenia    Sergio Bathe, MD, [facility] 07/22/2024 9:21 AM

## 2024-07-15 NOTE — TOC Transition Note (Signed)
 Transition of Care [facility]) - Discharge Note   Patient Details  Name: Sergio Curry MRN: [id number] Date of Birth: 1960-12-25  Transition of Care [facility]) CM/SW Contact:  Victory Jackquline RAMAN, RN Phone Number: 08/01/2024, 4:41 PM   Clinical Narrative:  Pt discharging to IPU at [facility]. Nurse to call report to [phone]. RNCM  Notified [name]. Transport set up with [facility]. Medical Necessity form completed. No further concerns. RNCM Signing off.     Final next level of care: Hospice Medical Facility Barriers to Discharge: Barriers Resolved   Patient Goals and CMS Choice            Discharge Placement                Patient to be transferred to facility by: [facility] Name of family member notified: [name] Patient and family notified of of transfer: 07/22/2024  Discharge Plan and Services Additional resources added to the After Visit Summary for                                       Social Drivers of Health (SDOH) Interventions SDOH Screenings   Food Insecurity: No Food Insecurity (07/07/2024)  Housing: Low Risk  (07/07/2024)  Transportation Needs: No Transportation Needs (07/07/2024)  Utilities: Not At Risk (07/07/2024)  Tobacco Use: Medium Risk (05/21/2020)     Readmission Risk Interventions     No data to display

## 2024-07-15 NOTE — Progress Notes (Signed)
 [facility]) HOSPITAL LIAISON NOTE   Received request from Waddell Lesches, NP/PMT that [name], patient's significant other, is interested in evaluation for Hospice InPatient Unit.  This RN notified Pietro Shove, Transitions of Care (TOC), who gave authorization to see patient.  [name], is not currently at the bedside.    1135am- Called and left a message for [name] to call me regarding new referral.  No call back received.  1225am- Called [name] again and was able to speak with her on the phone to initiate education related to hospice philosophy, services, comfort approach to care in the [facility] setting.  [name] verbalized understanding of information given.   12:00 pm-  Patient has been approved by Dr. Odella Pepper, hospice MD for GIP Level of Care.  Bed offered to [facility] and she accepted.    [facility] RN to call report to the Hospice Home at [phone]  This RN will arrange EMS transportation when the hospital team is ready for discharge and hospice consents have been completed.  Please medicate the patient prior to EMS transport as needed for comfort during transport.  Please send signed and completed DNR with patient at transport to the [facility].  [facility] information and contact numbers given to Adrian.  Above information shared with Pietro Shove, [facility] and hospital medical care team.   Please call with any hospice related questions or concerns. Thank you for the opportunity to participate in this patient's care.   Saddie HILARIO Na, MA, BSN, RN, FNE Nurse Liaison [phone]

## 2024-07-15 NOTE — Progress Notes (Signed)
 Daily Progress Note   Patient Name: Sergio Curry       Date: 07/27/2024 DOB: 01-Sep-1961  Age: 63 y.o. MRN#: [id number] Attending Physician: Tobie Calix, MD Primary Care Physician: Patient, No Pcp Per Admit Date: 07/07/2024  Reason for Consultation/Follow-up: Establishing goals of care  HPI/Brief Hospital Review: 63 y.o. male  with past medical history of EtOH abuse with alcoholic withdrawal seizures, liver disease, and bilateral hip replacement admitted on 07/07/2024 with AMS and jaundice.   Per chart review, patient presented to ED hypothermic and confirmed he has liver disease while also drinking a few beers a day.   Patient is being treated for mixed distributive and septic shock likely due to sepsis with multiorgan failure, toxic metabolic encephalopathy secondary to alcohol use disorder with acute alcoholic hepatitis and liver failure   Cultures revealed staph epidermidis bacteremia that was possibly a contaminant.  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  Cardiac MRI and repeat blood cultures pending.  Transitioned to CMO-compassionate extubation 11/1 by CCM team.  Palliative Medicine consulted for assisting with goals of care conversations.  Subjective: Extensive chart review has been completed prior to meeting patient including labs, vital signs, imaging, progress notes, orders, and available advanced directive documents from current and previous encounters.    Visited with Sergio Curry at his bedside. He is resting comfortably in bed, eyes closed, does not acknowledge my presence in room, appears comfortable without acute distress. Significant other-Tracy at bedside during time of visit.  Spoke with Sergio Curry regarding possible transition  to [facility] if interested. Discussed overall philosophy of hospice and IPU eligibility criteria. Sergio Curry voices understanding and requests to speak with [facility] hospice liaison further regarding IPU.  Called and spoke with Sergio Na, RN-[facility] hospice liaison, primary team and [facility] made aware.  Review of MAR, adjusted orders and medications to ensure comfort.  Mr. Teuscher approved for IPU-transport pending.  Objective:  Physical Exam Constitutional:      Appearance: He is ill-appearing.  HENT:     Head: Normocephalic.     Mouth/Throat:     Mouth: Mucous membranes are dry.  Pulmonary:     Effort: Pulmonary effort is normal. No respiratory distress.  Abdominal:     General: There is distension.  Skin:    Coloration: Skin is jaundiced.  Neurological:  Mental Status: He is lethargic.             Vital Signs: BP (!) 95/35 (BP Location: Left Arm)   Pulse 89   Temp 98.2 F (36.8 C)   Resp 16   Ht 5' 9 (1.753 m)   Wt 64.3 kg   SpO2 95%   BMI 20.93 kg/m  SpO2: SpO2: 95 % O2 Device: O2 Device: Ventilator O2 Flow Rate: O2 Flow Rate (L/min): 55 L/min   Palliative Care Assessment & Plan   Assessment/Recommendation/Plan  CMO remains Transfer to IPU  Thank you for allowing the Palliative Medicine Team to assist in the care of this patient.  Visit includes: Detailed review of medical records (labs, imaging, vital signs), medically appropriate exam (mental status, respiratory, cardiac, skin), discussed with treatment team, counseling and educating patient, family and staff, documenting clinical information, medication management and coordination of care.  Waddell Lesches, DNP, AGNP-C Palliative Medicine   Please contact Palliative Medicine Team phone at [phone] for questions and concerns.

## 2024-07-15 NOTE — Plan of Care (Signed)
  Problem: Pain Managment: Goal: General experience of comfort will improve and/or be controlled Outcome: Progressing   Problem: Safety: Goal: Ability to remain free from injury will improve Outcome: Progressing

## 2024-08-13 DEATH — deceased
